# Patient Record
Sex: Female | Born: 1955 | Race: White | Hispanic: No | Smoking: Never smoker
Health system: Southern US, Community
[De-identification: ages and names within clinical notes are randomized; demographics above are authoritative.]

## PROBLEM LIST (undated history)

## (undated) DIAGNOSIS — M25569 Pain in unspecified knee: Secondary | ICD-10-CM

## (undated) DIAGNOSIS — M8580 Other specified disorders of bone density and structure, unspecified site: Secondary | ICD-10-CM

## (undated) DIAGNOSIS — G8929 Other chronic pain: Secondary | ICD-10-CM

## (undated) DIAGNOSIS — K754 Autoimmune hepatitis: Secondary | ICD-10-CM

## (undated) DIAGNOSIS — F411 Generalized anxiety disorder: Secondary | ICD-10-CM

## (undated) DIAGNOSIS — T7840XA Allergy, unspecified, initial encounter: Secondary | ICD-10-CM

## (undated) DIAGNOSIS — K219 Gastro-esophageal reflux disease without esophagitis: Secondary | ICD-10-CM

## (undated) DIAGNOSIS — G4700 Insomnia, unspecified: Secondary | ICD-10-CM

## (undated) DIAGNOSIS — H269 Unspecified cataract: Secondary | ICD-10-CM

## (undated) DIAGNOSIS — I10 Essential (primary) hypertension: Secondary | ICD-10-CM

## (undated) DIAGNOSIS — E119 Type 2 diabetes mellitus without complications: Secondary | ICD-10-CM

## (undated) DIAGNOSIS — E1165 Type 2 diabetes mellitus with hyperglycemia: Secondary | ICD-10-CM

## (undated) DIAGNOSIS — M545 Low back pain, unspecified: Secondary | ICD-10-CM

## (undated) DIAGNOSIS — E785 Hyperlipidemia, unspecified: Principal | ICD-10-CM

## (undated) DIAGNOSIS — R053 Chronic cough: Secondary | ICD-10-CM

## (undated) DIAGNOSIS — K589 Irritable bowel syndrome without diarrhea: Secondary | ICD-10-CM

## (undated) HISTORY — DX: Pain in unspecified knee: M25.569

## (undated) HISTORY — DX: Type 2 diabetes mellitus with hyperglycemia: E11.65

## (undated) HISTORY — DX: Allergy, unspecified, initial encounter: T78.40XA

## (undated) HISTORY — DX: Autoimmune hepatitis: K75.4

## (undated) HISTORY — DX: Low back pain: M54.5

## (undated) HISTORY — DX: Other chronic pain: G89.29

## (undated) HISTORY — DX: Chronic cough: R05.3

## (undated) HISTORY — DX: Other specified disorders of bone density and structure, unspecified site: M85.80

## (undated) HISTORY — DX: Gastro-esophageal reflux disease without esophagitis: K21.9

## (undated) HISTORY — PX: COLONOSCOPY: SHX174

## (undated) HISTORY — PX: BREAST SURGERY: SHX581

## (undated) HISTORY — DX: Type 2 diabetes mellitus without complications: E11.9

## (undated) HISTORY — PX: CHOLECYSTECTOMY: SHX55

## (undated) HISTORY — DX: Irritable bowel syndrome without diarrhea: K58.9

## (undated) HISTORY — PX: FOOT SURGERY: SHX648

## (undated) HISTORY — DX: Unspecified cataract: H26.9

## (undated) HISTORY — DX: Low back pain, unspecified: M54.50

## (undated) HISTORY — DX: Insomnia, unspecified: G47.00

## (undated) HISTORY — DX: Essential (primary) hypertension: I10

## (undated) HISTORY — PX: MENISCUS REPAIR: SHX5179

## (undated) HISTORY — PX: TONSILLECTOMY: SUR1361

## (undated) HISTORY — DX: Hyperlipidemia, unspecified: E78.5

## (undated) HISTORY — DX: Generalized anxiety disorder: F41.1

## (undated) HISTORY — PX: BREAST EXCISIONAL BIOPSY: SUR124

---

## 2005-01-14 ENCOUNTER — Other Ambulatory Visit: Admission: RE | Admit: 2005-01-14 | Discharge: 2005-01-14 | Payer: Self-pay | Admitting: Obstetrics and Gynecology

## 2007-08-14 ENCOUNTER — Ambulatory Visit: Payer: Self-pay | Admitting: Licensed Clinical Social Worker

## 2007-08-23 ENCOUNTER — Ambulatory Visit: Payer: Self-pay | Admitting: Licensed Clinical Social Worker

## 2007-08-30 ENCOUNTER — Encounter: Payer: Self-pay | Admitting: Family Medicine

## 2007-08-30 ENCOUNTER — Ambulatory Visit: Payer: Self-pay | Admitting: Licensed Clinical Social Worker

## 2007-08-31 ENCOUNTER — Encounter: Payer: Self-pay | Admitting: Family Medicine

## 2007-09-20 ENCOUNTER — Ambulatory Visit: Payer: Self-pay | Admitting: Licensed Clinical Social Worker

## 2007-10-18 ENCOUNTER — Ambulatory Visit: Payer: Self-pay | Admitting: Family Medicine

## 2007-10-18 DIAGNOSIS — I10 Essential (primary) hypertension: Secondary | ICD-10-CM

## 2007-10-18 DIAGNOSIS — K219 Gastro-esophageal reflux disease without esophagitis: Secondary | ICD-10-CM | POA: Insufficient documentation

## 2007-10-18 DIAGNOSIS — R519 Headache, unspecified: Secondary | ICD-10-CM

## 2007-10-18 DIAGNOSIS — F411 Generalized anxiety disorder: Secondary | ICD-10-CM

## 2007-10-18 DIAGNOSIS — K589 Irritable bowel syndrome without diarrhea: Secondary | ICD-10-CM

## 2007-10-18 DIAGNOSIS — E1159 Type 2 diabetes mellitus with other circulatory complications: Secondary | ICD-10-CM

## 2007-10-18 DIAGNOSIS — R51 Headache: Secondary | ICD-10-CM | POA: Insufficient documentation

## 2007-10-18 DIAGNOSIS — J309 Allergic rhinitis, unspecified: Secondary | ICD-10-CM

## 2007-10-18 HISTORY — DX: Allergic rhinitis, unspecified: J30.9

## 2007-10-18 HISTORY — DX: Gastro-esophageal reflux disease without esophagitis: K21.9

## 2007-10-18 HISTORY — DX: Headache, unspecified: R51.9

## 2007-10-18 HISTORY — DX: Generalized anxiety disorder: F41.1

## 2007-10-18 HISTORY — DX: Essential (primary) hypertension: I10

## 2007-10-18 HISTORY — DX: Irritable bowel syndrome, unspecified: K58.9

## 2007-10-25 ENCOUNTER — Encounter: Payer: Self-pay | Admitting: Family Medicine

## 2008-06-04 ENCOUNTER — Telehealth: Payer: Self-pay | Admitting: Family Medicine

## 2008-06-24 ENCOUNTER — Telehealth: Payer: Self-pay | Admitting: Family Medicine

## 2014-03-12 ENCOUNTER — Encounter: Payer: Self-pay | Admitting: Family Medicine

## 2014-03-12 ENCOUNTER — Telehealth: Payer: Self-pay | Admitting: Family Medicine

## 2014-03-12 ENCOUNTER — Ambulatory Visit (INDEPENDENT_AMBULATORY_CARE_PROVIDER_SITE_OTHER): Payer: PRIVATE HEALTH INSURANCE | Admitting: Family Medicine

## 2014-03-12 VITALS — BP 102/80 | HR 83 | Temp 97.8°F | Ht 66.0 in | Wt 209.5 lb

## 2014-03-12 DIAGNOSIS — E785 Hyperlipidemia, unspecified: Secondary | ICD-10-CM

## 2014-03-12 DIAGNOSIS — K219 Gastro-esophageal reflux disease without esophagitis: Secondary | ICD-10-CM

## 2014-03-12 DIAGNOSIS — M545 Low back pain: Secondary | ICD-10-CM

## 2014-03-12 DIAGNOSIS — I10 Essential (primary) hypertension: Secondary | ICD-10-CM

## 2014-03-12 DIAGNOSIS — G4700 Insomnia, unspecified: Secondary | ICD-10-CM

## 2014-03-12 DIAGNOSIS — Z23 Encounter for immunization: Secondary | ICD-10-CM

## 2014-03-12 HISTORY — DX: Insomnia, unspecified: G47.00

## 2014-03-12 HISTORY — DX: Hyperlipidemia, unspecified: E78.5

## 2014-03-12 NOTE — Telephone Encounter (Signed)
emmi emailed °

## 2014-03-12 NOTE — Addendum Note (Signed)
Addended by: Agnes Lawrence on: 03/12/2014 12:49 PM   Modules accepted: Orders

## 2014-03-12 NOTE — Patient Instructions (Addendum)
BEFORE YOU LEAVE: -Tdap -low back exercises -follow up morning appointment in 1 month - come fasting  -let us know which cholesterol medication dose you are taking  Schedule mammogram - call number provided  For you back: Exercises provided 4 days per week Follow up in 1 month   We advise that you please obtain and provide [place] with the following so that we can provide you with the best care possible:  NOTE: please provide ONLY the requested documents and reserve a copy for yourself  -copies of all vaccines -reports from any heart studies, EKGs, xrays, MRIs, CT scans, ultrasounds or biopsies -any lab tests done in the last 2 years -reports from any gastrointestinal studies such as colonoscopy or endoscopy -reports from any pap smears, mammograms or bone density tests -physical exam doctor notes from your last physical -the most recent office visit note from any specialist you have seen  Thank you.   FOR IMPROVED SLEEP AND TO RESET YOUR SLEEP SCHEDULE: []  exercise 30 minutes daily  []  go to bed and wake up at the same time  []  keep bedroom cool, dark and quiet  []  reserve bed for sleep - do not read, watch TV, etc in bed  []  If you toss and turn more then 15-20 minutes get out of bed and list thoughts/do quite activity then go back to bed; repeat as needed; do not worry about when you eventually fall asleep - still get up at the same time and turn on lights and take shower  [] get counseling  []  some people find that a half dose of benadryl, melatonin, tylenol pm or unisom on a few nights per week is helpful initially for a few weeks  [] seek help and treat any depression or anxiety  [] prescription strength sleep medications should only be used in severe cases of insomnia if other measures fail and should be used sparingly

## 2014-03-12 NOTE — Progress Notes (Signed)
HPI:  Karen Hall is here to establish care. Used to live in [place] but felt like she was doing too many tests and lives closer now and has insurance now.  Has the following chronic problems that require follow up and concerns today:  GERD: -on chronic low dose PPI or has issue -reports has egd and colonoscopy and all was ok -hx asymptomatic gallstones  HTN/HLD: -losartan-hctz 50-12.5, lovastatin (unsure of dose today and out of this- wonders if needs it) -stable -denies: CP, SOB, DOE, swelling  Insomnia/GAD: -uses ambien several times per month -trouble falling asleep -takes celexa  -no regular counseling, no regular exercise -no sleep walking, sleep eating, apnea, no SI or hx hospitalization  Hx chronic intermittent low back pain: -several flares per year -current episode started a few days ago with standing -constant R and L low back, moderate pain -denies: weakness, numbness, bowel or bladder incontinence  HM: -mammo out of date -declined flu -ok with getting tdap -going ot check on insurance for shingles vaccine  ROS negative for unless reported above: fevers, unintentional weight loss, hearing or vision loss, chest pain, palpitations, struggling to breath, hemoptysis, melena, hematochezia, hematuria, falls, loc, si, thoughts of self harm  Past Medical History  Diagnosis Date  . HYPERTENSION 10/18/2007    Qualifier: Diagnosis of  By: Sherlynn Stalls, CMA, [place]    . Irritable bowel syndrome 10/18/2007    Qualifier: Diagnosis of  By: Sarajane Jews MD, Ishmael Holter   . GERD 10/18/2007    Qualifier: Diagnosis of  By: Sherlynn Stalls, CMA, [place]    . Insomnia 03/12/2014  . Hyperlipemia 03/12/2014  . ANXIETY 10/18/2007    Qualifier: Diagnosis of  By: Sarajane Jews MD, Ishmael Holter   . Chronic low back pain     Past Surgical History  Procedure Laterality Date  . Meniscus repair Right   . Breast surgery      fibroid  . Tonsillectomy      Family History  Problem Relation Age of Onset  . Cancer  Mother     melanoma  . Cancer Father     liver    History   Social History  . Marital Status: Widowed    Spouse Name: N/A    Number of Children: N/A  . Years of Education: N/A   Social History Main Topics  . Smoking status: Never Smoker   . Smokeless tobacco: None  . Alcohol Use: None  . Drug Use: None  . Sexual Activity: None   Other Topics Concern  . None   Social History Narrative   Work or School: Press photographer - [employer]       Home Situation: lives alone      Spiritual Beliefs: none      Lifestyle: no regular exercise; poor diet          Current outpatient prescriptions: citalopram (CELEXA) 20 MG tablet, Take 20 mg by mouth daily., Disp: , Rfl: ;  co-enzyme Q-10 30 MG capsule, Take 30 mg by mouth daily., Disp: , Rfl: ;  diphenhydramine-acetaminophen (TYLENOL PM) 25-500 MG TABS, Take 1 tablet by mouth at bedtime as needed., Disp: , Rfl: ;  Homeopathic Products (ZICAM ALLERGY RELIEF) GEL, Place into the nose., Disp: , Rfl:  losartan-hydrochlorothiazide (HYZAAR) 50-12.5 MG per tablet, Take 1 tablet by mouth daily., Disp: , Rfl: ;  LOVASTATIN PO, Take by mouth daily., Disp: , Rfl: ;  zolpidem (AMBIEN CR) 6.25 MG CR tablet, Take 6.25 mg by mouth at bedtime as  needed for sleep., Disp: , Rfl:   EXAM:  Filed Vitals:   03/12/14 1119  BP: 102/80  Pulse: 83  Temp: 97.8 F (36.6 C)    Body mass index is 33.83 kg/(m^2).  GENERAL: vitals reviewed and listed above, alert, oriented, appears well hydrated and in no acute distress  HEENT: atraumatic, conjunttiva clear, no obvious abnormalities on inspection of external nose and ears  NECK: no obvious masses on inspection  LUNGS: clear to auscultation bilaterally, no wheezes, rales or rhonchi, good air movement  CV: HRRR, no peripheral edema  MS: moves all extremities without noticeable abnormality Normal Gait Normal inspection of back, no obvious scoliosis or leg length descrepancy No bony TTP Soft tissue TTP  at: bilat R > L lumbar paraspinal muscles, R PSIS, R hip higer -/+ tests: neg trendelenburg,+facet loading R> L, -SLRT, -CLRT, -FABER, -FADIR Normal muscle strength, sensation to light touch and DTRs in LEs bilaterally  PSYCH: pleasant and cooperative, no obvious depression or anxiety  ASSESSMENT AND PLAN:  Discussed the following assessment and plan:  Hyperlipemia  Insomnia  Essential hypertension  Gastroesophageal reflux disease, esophagitis presence not specified  Low back pain without sciatica, unspecified back pain laterality  We reviewed the PMH, PSH, FH, SH, Meds and Allergies. -We provided refills for any medications we will prescribe as needed. -We addressed current concerns per orders and patient instructions. -We have asked for records for pertinent exams, studies, vaccines and notes from previous providers. -We have advised patient to follow up per instructions below.   -Patient advised to return or notify a doctor immediately if symptoms worsen or persist or new concerns arise.  Patient Instructions  BEFORE YOU LEAVE: -Tdap -low back exercises -follow up morning appointment in 1 month - come fasting  -let us know which cholesterol medication dose you are taking  Schedule mammogram - call number provided  For you back: Exercises provided 4 days per week Follow up in 1 month   We advise that you please obtain and provide [place] with the following so that we can provide you with the best care possible:  NOTE: please provide ONLY the requested documents and reserve a copy for yourself  -copies of all vaccines -reports from any heart studies, EKGs, xrays, MRIs, CT scans, ultrasounds or biopsies -any lab tests done in the last 2 years -reports from any gastrointestinal studies such as colonoscopy or endoscopy -reports from any pap smears, mammograms or bone density tests -physical exam doctor notes from your last physical -the most recent office visit note from  any specialist you have seen  Thank you.   FOR IMPROVED SLEEP AND TO RESET YOUR SLEEP SCHEDULE: []  exercise 30 minutes daily  []  go to bed and wake up at the same time  []  keep bedroom cool, dark and quiet  []  reserve bed for sleep - do not read, watch TV, etc in bed  []  If you toss and turn more then 15-20 minutes get out of bed and list thoughts/do quite activity then go back to bed; repeat as needed; do not worry about when you eventually fall asleep - still get up at the same time and turn on lights and take shower  [] get counseling  []  some people find that a half dose of benadryl, melatonin, tylenol pm or unisom on a few nights per week is helpful initially for a few weeks  [] seek help and treat any depression or anxiety  [] prescription strength sleep medications should only be used in  severe cases of insomnia if other measures fail and should be used sparingly      Azariyah Luhrs, Jarrett Soho R.

## 2014-03-12 NOTE — Progress Notes (Signed)
Pre visit review using our clinic review tool, if applicable. No additional management support is needed unless otherwise documented below in the visit note. 

## 2014-03-13 ENCOUNTER — Telehealth: Payer: Self-pay | Admitting: Family Medicine

## 2014-03-13 NOTE — Telephone Encounter (Signed)
Pt is on atorvastatin 10 mg #30 and needs refill call into [facility]. Pt was seen yesterday

## 2014-03-14 MED ORDER — ATORVASTATIN CALCIUM 10 MG PO TABS
10.0000 mg | ORAL_TABLET | Freq: Every day | ORAL | Status: DC
Start: 2014-03-14 — End: 2015-03-24

## 2014-03-14 NOTE — Telephone Encounter (Signed)
Noted  

## 2014-03-14 NOTE — Telephone Encounter (Signed)
Sent!

## 2014-04-01 ENCOUNTER — Other Ambulatory Visit: Payer: Self-pay | Admitting: Family Medicine

## 2014-04-01 MED ORDER — ZOLPIDEM TARTRATE ER 6.25 MG PO TBCR: EXTENDED_RELEASE_TABLET | ORAL | Status: DC

## 2014-04-01 NOTE — Addendum Note (Signed)
Addended by: Westley Hummer B on: 04/01/2014 04:11 PM   Modules accepted: Orders

## 2014-04-08 ENCOUNTER — Encounter: Payer: Self-pay | Admitting: Family Medicine

## 2014-04-10 ENCOUNTER — Ambulatory Visit: Payer: PRIVATE HEALTH INSURANCE | Admitting: Family Medicine

## 2014-04-19 ENCOUNTER — Ambulatory Visit: Payer: PRIVATE HEALTH INSURANCE | Admitting: Family Medicine

## 2014-04-22 ENCOUNTER — Encounter: Payer: Self-pay | Admitting: Family Medicine

## 2014-04-22 ENCOUNTER — Ambulatory Visit (INDEPENDENT_AMBULATORY_CARE_PROVIDER_SITE_OTHER): Payer: PRIVATE HEALTH INSURANCE | Admitting: Family Medicine

## 2014-04-22 ENCOUNTER — Other Ambulatory Visit: Payer: Self-pay | Admitting: *Deleted

## 2014-04-22 VITALS — BP 100/76 | HR 80 | Temp 98.1°F | Ht 66.0 in | Wt 208.1 lb

## 2014-04-22 DIAGNOSIS — K219 Gastro-esophageal reflux disease without esophagitis: Secondary | ICD-10-CM

## 2014-04-22 DIAGNOSIS — G4700 Insomnia, unspecified: Secondary | ICD-10-CM

## 2014-04-22 DIAGNOSIS — E785 Hyperlipidemia, unspecified: Secondary | ICD-10-CM

## 2014-04-22 DIAGNOSIS — R899 Unspecified abnormal finding in specimens from other organs, systems and tissues: Secondary | ICD-10-CM

## 2014-04-22 DIAGNOSIS — Z23 Encounter for immunization: Secondary | ICD-10-CM

## 2014-04-22 DIAGNOSIS — M549 Dorsalgia, unspecified: Secondary | ICD-10-CM

## 2014-04-22 DIAGNOSIS — I10 Essential (primary) hypertension: Secondary | ICD-10-CM

## 2014-04-22 LAB — BASIC METABOLIC PANEL
BUN: 20 mg/dL (ref 6–23)
CO2: 25 meq/L (ref 19–32)
Calcium: 9.7 mg/dL (ref 8.4–10.5)
Chloride: 103 mEq/L (ref 96–112)
Creatinine, Ser: 1.34 mg/dL — ABNORMAL HIGH (ref 0.40–1.20)
GFR: 42.78 mL/min — AB (ref >60.00)
Glucose, Bld: 126 mg/dL — ABNORMAL HIGH (ref 70–99)
POTASSIUM: 3.8 meq/L (ref 3.5–5.1)
Sodium: 138 mEq/L (ref 135–145)

## 2014-04-22 LAB — LIPID PANEL
Cholesterol: 200 mg/dL (ref 0–200)
HDL: 41.4 mg/dL (ref >39.00)
NONHDL: 158.6
TRIGLYCERIDES: 342 mg/dL — AB (ref 0.0–149.0)
Total CHOL/HDL Ratio: 5
VLDL: 68.4 mg/dL — ABNORMAL HIGH (ref 0.0–40.0)

## 2014-04-22 LAB — LDL CHOLESTEROL, DIRECT: Direct LDL: 95 mg/dL

## 2014-04-22 MED ORDER — LOSARTAN POTASSIUM-HCTZ 50-12.5 MG PO TABS: 0.5000 | ORAL_TABLET | Freq: Every day | ORAL | Status: DC

## 2014-04-22 NOTE — Progress Notes (Signed)
HPI:  Hx chronic intermittent low back pain: -resolved - doing much better! -denies: weakness, numbness, bowel or bladder incontinence  GERD: -on chronic low dose PPI or has issue -reports has egd and colonoscopy and all was ok -hx asymptomatic gallstones  HTN/HLD: -losartan-hctz 50-12.5, statin -stable - reports always running low and wants to decrease BP med -denies: CP, SOB, DOE, swelling  Insomnia/GAD: -uses ambien several times per month -trouble falling asleep -takes celexa  -no regular counseling, no regular exercise -no sleep walking, sleep eating, apnea, no SI or hx hospitalization     ROS: See pertinent positives and negatives per HPI.  Past Medical History  Diagnosis Date  . HYPERTENSION 10/18/2007    Qualifier: Diagnosis of  By: Sherlynn Stalls, CMA, [place]    . Irritable bowel syndrome 10/18/2007    Qualifier: Diagnosis of  By: Sarajane Jews MD, Ishmael Holter   . GERD 10/18/2007    Qualifier: Diagnosis of  By: Sherlynn Stalls, CMA, [place]    . Insomnia 03/12/2014  . Hyperlipemia 03/12/2014  . ANXIETY 10/18/2007    Qualifier: Diagnosis of  By: Sarajane Jews MD, Ishmael Holter   . Chronic low back pain     Past Surgical History  Procedure Laterality Date  . Meniscus repair Right   . Breast surgery      fibroid  . Tonsillectomy      Family History  Problem Relation Age of Onset  . Cancer Mother     melanoma  . Cancer Father     liver    History   Social History  . Marital Status: Widowed    Spouse Name: N/A    Number of Children: N/A  . Years of Education: N/A   Social History Main Topics  . Smoking status: Never Smoker   . Smokeless tobacco: None  . Alcohol Use: None  . Drug Use: None  . Sexual Activity: None   Other Topics Concern  . None   Social History Narrative   Work or School: Press photographer - [employer]       Home Situation: lives alone      Spiritual Beliefs: none      Lifestyle: no regular exercise; poor diet           Current outpatient prescriptions:  .   amoxicillin (AMOXIL) 500 MG capsule, 4 (four) times daily - after meals and at bedtime. , Disp: , Rfl: 0 .  atorvastatin (LIPITOR) 10 MG tablet, Take 1 tablet (10 mg total) by mouth daily., Disp: 30 tablet, Rfl: 11 .  citalopram (CELEXA) 20 MG tablet, Take 20 mg by mouth daily., Disp: , Rfl:  .  co-enzyme Q-10 30 MG capsule, Take 30 mg by mouth daily., Disp: , Rfl:  .  diphenhydramine-acetaminophen (TYLENOL PM) 25-500 MG TABS, Take 1 tablet by mouth at bedtime as needed., Disp: , Rfl:  .  Homeopathic Products (ZICAM ALLERGY RELIEF) GEL, Place into the nose., Disp: , Rfl:  .  losartan-hydrochlorothiazide (HYZAAR) 50-12.5 MG per tablet, Take 0.5 tablets by mouth daily., Disp: 45 tablet, Rfl: 3 .  zolpidem (AMBIEN CR) 6.25 MG CR tablet, 1 tablet at bedtime as needed and only for severe sleep disorder. Use as little as possible. Needs office visit prior to further refills., Disp: 20 tablet, Rfl: 0  EXAM:  Filed Vitals:   04/22/14 0814  BP: 100/76  Pulse: 80  Temp: 98.1 F (36.7 C)    Body mass index is 33.6 kg/(m^2).  GENERAL: vitals reviewed and listed above,  alert, oriented, appears well hydrated and in no acute distress  HEENT: atraumatic, conjunttiva clear, no obvious abnormalities on inspection of external nose and ears  NECK: no obvious masses on inspection  LUNGS: clear to auscultation bilaterally, no wheezes, rales or rhonchi, good air movement  CV: HRRR, no peripheral edema  MS: moves all extremities without noticeable abnormality  PSYCH: pleasant and cooperative, no obvious depression or anxiety  ASSESSMENT AND PLAN:  Discussed the following assessment and plan:  Essential hypertension - Plan: Basic metabolic panel  Gastroesophageal reflux disease, esophagitis presence not specified  Hyperlipemia - Plan: Lipid Panel  Insomnia  Bilateral back pain, unspecified location  -FASTING LABS -advised mammo, flu and shingles vaccines -Patient advised to return or notify  a doctor immediately if symptoms worsen or persist or new concerns arise.  Patient Instructions  BEFORE YOU LEAVE: -shingles vaccine -labs -nurse visit in 2-4 weeks to recheck BP on lower dose medication  Schedule mammogram  Decrease BP med to 1/2 tablet per day and monitor  Continue back exercises 3 days per week when feeling fine  -We have ordered labs or studies at this visit. It can take up to 1-2 weeks for results and processing. We will contact you with instructions IF your results are abnormal. Normal results will be released to your [facility]. If you have not heard from [place] or can not find your results in [facility] in 2 weeks please contact our office.  -PLEASE SIGN UP FOR MYCHART TODAY   We recommend the following healthy lifestyle measures: - eat a healthy diet consisting of lots of vegetables, fruits, beans, nuts, seeds, healthy meats such as white chicken and fish and whole grains.  - avoid fried foods, fast food, processed foods, sodas, red meet and other fattening foods.  - get a least 150 minutes of aerobic exercise per week.       Colin Benton R.

## 2014-04-22 NOTE — Patient Instructions (Addendum)
BEFORE YOU LEAVE: -shingles vaccine -labs -nurse visit in 2-4 weeks to recheck BP on lower dose medication  Schedule mammogram  Decrease BP med to 1/2 tablet per day and monitor  Continue back exercises 3 days per week when feeling fine  -We have ordered labs or studies at this visit. It can take up to 1-2 weeks for results and processing. We will contact you with instructions IF your results are abnormal. Normal results will be released to your [facility]. If you have not heard from [place] or can not find your results in [facility] in 2 weeks please contact our office.  -PLEASE SIGN UP FOR MYCHART TODAY   We recommend the following healthy lifestyle measures: - eat a healthy diet consisting of lots of vegetables, fruits, beans, nuts, seeds, healthy meats such as white chicken and fish and whole grains.  - avoid fried foods, fast food, processed foods, sodas, red meet and other fattening foods.  - get a least 150 minutes of aerobic exercise per week.

## 2014-04-22 NOTE — Addendum Note (Signed)
Addended by: Agnes Lawrence on: 04/22/2014 08:48 AM   Modules accepted: Orders

## 2014-04-22 NOTE — Progress Notes (Signed)
Pre visit review using our clinic review tool, if applicable. No additional management support is needed unless otherwise documented below in the visit note. 

## 2014-04-25 ENCOUNTER — Telehealth: Payer: Self-pay | Admitting: Family Medicine

## 2014-04-25 NOTE — Telephone Encounter (Signed)
emmi emailed °

## 2014-04-29 ENCOUNTER — Other Ambulatory Visit: Payer: PRIVATE HEALTH INSURANCE

## 2014-05-02 ENCOUNTER — Other Ambulatory Visit (INDEPENDENT_AMBULATORY_CARE_PROVIDER_SITE_OTHER): Payer: PRIVATE HEALTH INSURANCE

## 2014-05-02 DIAGNOSIS — R899 Unspecified abnormal finding in specimens from other organs, systems and tissues: Secondary | ICD-10-CM

## 2014-05-02 LAB — BASIC METABOLIC PANEL
BUN: 19 mg/dL (ref 6–23)
CALCIUM: 10.2 mg/dL (ref 8.4–10.5)
CO2: 27 meq/L (ref 19–32)
CREATININE: 1.17 mg/dL (ref 0.40–1.20)
Chloride: 102 mEq/L (ref 96–112)
GFR: 50.03 mL/min — AB (ref >60.00)
GLUCOSE: 100 mg/dL — AB (ref 70–99)
Potassium: 4.2 mEq/L (ref 3.5–5.1)
SODIUM: 135 meq/L (ref 135–145)

## 2014-05-02 LAB — HEMOGLOBIN A1C: Hgb A1c MFr Bld: 7.1 % — ABNORMAL HIGH (ref 4.6–6.5)

## 2014-05-14 ENCOUNTER — Other Ambulatory Visit: Payer: Self-pay

## 2014-05-14 DIAGNOSIS — Z1231 Encounter for screening mammogram for malignant neoplasm of breast: Secondary | ICD-10-CM

## 2014-05-20 ENCOUNTER — Ambulatory Visit
Admission: RE | Admit: 2014-05-20 | Discharge: 2014-05-20 | Disposition: A | Discharge status: Home / Self Care | Payer: PRIVATE HEALTH INSURANCE | Source: Ambulatory Visit

## 2014-05-20 DIAGNOSIS — Z1231 Encounter for screening mammogram for malignant neoplasm of breast: Secondary | ICD-10-CM

## 2014-06-05 ENCOUNTER — Telehealth: Payer: Self-pay | Admitting: Family Medicine

## 2014-06-05 MED ORDER — CITALOPRAM HYDROBROMIDE 20 MG PO TABS: 20.0000 mg | ORAL_TABLET | Freq: Every day | ORAL | Status: DC

## 2014-06-05 NOTE — Telephone Encounter (Signed)
Pt request refill of the following: citalopram (CELEXA) 20 MG tablet   Phamacy: [place]

## 2014-07-04 ENCOUNTER — Ambulatory Visit (INDEPENDENT_AMBULATORY_CARE_PROVIDER_SITE_OTHER): Payer: PRIVATE HEALTH INSURANCE | Admitting: Family Medicine

## 2014-07-04 ENCOUNTER — Encounter: Payer: Self-pay | Admitting: Family Medicine

## 2014-07-04 VITALS — BP 120/86 | HR 81 | Temp 98.0°F | Ht 66.0 in | Wt 201.4 lb

## 2014-07-04 DIAGNOSIS — Z6832 Body mass index (BMI) 32.0-32.9, adult: Secondary | ICD-10-CM

## 2014-07-04 DIAGNOSIS — I10 Essential (primary) hypertension: Secondary | ICD-10-CM

## 2014-07-04 DIAGNOSIS — J309 Allergic rhinitis, unspecified: Secondary | ICD-10-CM

## 2014-07-04 DIAGNOSIS — E119 Type 2 diabetes mellitus without complications: Secondary | ICD-10-CM

## 2014-07-04 DIAGNOSIS — E785 Hyperlipidemia, unspecified: Secondary | ICD-10-CM

## 2014-07-04 DIAGNOSIS — G4700 Insomnia, unspecified: Secondary | ICD-10-CM

## 2014-07-04 MED ORDER — ZOLPIDEM TARTRATE ER 6.25 MG PO TBCR: EXTENDED_RELEASE_TABLET | ORAL | Status: DC

## 2014-07-04 NOTE — Progress Notes (Signed)
Pre visit review using our clinic review tool, if applicable. No additional management support is needed unless otherwise documented below in the visit note. 

## 2014-07-04 NOTE — Patient Instructions (Addendum)
BEFORE YOU LEAVE: -schedule follow up in 3 months   FOR YOUR DIABETES:  []  Eat a healthy low carb diet (avoid sweets, sweet drinks, breads, potatoes, rice, etc.) and ensure 3 small meals daily.  []  Get AT LEAST 150 minutes of cardiovascular exercise per week - 30 minutes per day is best of sustained sweaty exercise.  []  Take all of your medications every day as directed by your doctor. Call your doctor immediately if you have any questions about your medications or are running low.  []  If any low blood sugars < 70, eat a snack and call your doctor immediately.  []  See an eye doctor every year and fax your diabetic eye exam to our office.  Fax: [phone]  []  Take good care of your feet and keep them soft and callus free. Check your feet daily and wear comfortable shoes. See your doctor immediately if you have any cuts, calluses or wounds on your feet.

## 2014-07-04 NOTE — Progress Notes (Addendum)
HPI:  Karen Hall is a 59 new patient to me here for an acute visit to discuss:  DM: -new on repeated labs recently - she was to follow up promptly but she wanted to work on diet and exercise for 2 months before follow up -reports: is doing weight watchers, she is also planning to use essential oils - she is doing weight watchers and has lost 8 lbs - reports hates vegetables and water but is working on portion sizes -denies: polyuria, polydipsia  HTN: -meds: losartan-hctz -wanted to decrease BP meds last visit - she was to have nurse visit 2 weeks later but did not do this -stable - BP looks good today -reports feels better on lower dose of BP medication  HLD/Obesity: -on statin -stable - she would like to eventual get off of the statin if possible  ROS: See pertinent positives and negatives per HPI.  Past Medical History  Diagnosis Date  . HYPERTENSION 10/18/2007    Qualifier: Diagnosis of  By: Sherlynn Stalls, CMA, [place]    . Irritable bowel syndrome 10/18/2007    Qualifier: Diagnosis of  By: Sarajane Jews MD, Ishmael Holter   . GERD 10/18/2007    Qualifier: Diagnosis of  By: Sherlynn Stalls, CMA, [place]    . Insomnia 03/12/2014  . Hyperlipemia 03/12/2014  . ANXIETY 10/18/2007    Qualifier: Diagnosis of  By: Sarajane Jews MD, Ishmael Holter   . Chronic low back pain   . Recurrent knee pain     R knee hx meniscal tear 2002 with repair    Past Surgical History  Procedure Laterality Date  . Meniscus repair Right   . Breast surgery      fibroid  . Tonsillectomy      Family History  Problem Relation Age of Onset  . Cancer Mother     melanoma  . Cancer Father     liver    History   Social History  . Marital Status: Widowed    Spouse Name: N/A  . Number of Children: N/A  . Years of Education: N/A   Social History Main Topics  . Smoking status: Never Smoker   . Smokeless tobacco: Not on file  . Alcohol Use: Not on file  . Drug Use: Not on file  . Sexual Activity: Not on file   Other Topics Concern  .  None   Social History Narrative   Work or School: Press photographer - [employer]       Home Situation: lives alone      Spiritual Beliefs: none      Lifestyle: no regular exercise; poor diet           Current outpatient prescriptions:  .  atorvastatin (LIPITOR) 10 MG tablet, Take 1 tablet (10 mg total) by mouth daily., Disp: 30 tablet, Rfl: 11 .  citalopram (CELEXA) 20 MG tablet, Take 1 tablet (20 mg total) by mouth daily., Disp: 90 tablet, Rfl: 1 .  co-enzyme Q-10 30 MG capsule, Take 30 mg by mouth daily., Disp: , Rfl:  .  diphenhydramine-acetaminophen (TYLENOL PM) 25-500 MG TABS, Take 1 tablet by mouth at bedtime as needed., Disp: , Rfl:  .  Homeopathic Products (ZICAM ALLERGY RELIEF) GEL, Place into the nose., Disp: , Rfl:  .  losartan-hydrochlorothiazide (HYZAAR) 50-12.5 MG per tablet, Take 0.5 tablets by mouth daily., Disp: 45 tablet, Rfl: 3 .  zolpidem (AMBIEN CR) 6.25 MG CR tablet, 1 tablet at bedtime as needed and only for severe sleep disorder. Use  as little as possible. Needs office visit prior to further refills., Disp: 20 tablet, Rfl: 0  EXAM:  Filed Vitals:   07/04/14 1046  BP: 120/86  Pulse: 81  Temp: 98 F (36.7 C)    Body mass index is 32.52 kg/(m^2).  GENERAL: vitals reviewed and listed above, alert, oriented, appears well hydrated and in no acute distress  HEENT: atraumatic, conjunttiva clear, no obvious abnormalities on inspection of external nose and ears  NECK: no obvious masses on inspection  LUNGS: clear to auscultation bilaterally, no wheezes, rales or rhonchi, good air movement  CV: HRRR, no peripheral edema  MS: moves all extremities without noticeable abnormality  PSYCH: pleasant and cooperative, no obvious depression or anxiety  ASSESSMENT AND PLAN:  Discussed the following assessment and plan:  Essential hypertension  Type 2 diabetes mellitus without complication  Allergic rhinitis, unspecified allergic rhinitis  type  Hyperlipemia  BMI 32.0-32.9,adult  Insomnia  -long discussion about diabetes, implications, treatment - she is very against medications and wants to treat with diet, exercis and essential oils -she loves carbs and hates veggies and water and is not exercising, but she has been able to loose some weight with modifying calorie intake -she opted ofr lifestyle changes and follow up in 3 months with labs then -at the end of a long appointment requests ambien refill - advised her I do not recommend this for regular or long term use and do not rx as such, refills small amount to use sparingly -Patient advised to return or notify a doctor immediately if symptoms worsen or persist or new concerns arise.  Patient Instructions  BEFORE YOU LEAVE: -schedule follow up in 3 months   FOR YOUR DIABETES:  []  Eat a healthy low carb diet (avoid sweets, sweet drinks, breads, potatoes, rice, etc.) and ensure 3 small meals daily.  []  Get AT LEAST 150 minutes of cardiovascular exercise per week - 30 minutes per day is best of sustained sweaty exercise.  []  Take all of your medications every day as directed by your doctor. Call your doctor immediately if you have any questions about your medications or are running low.  []  If any low blood sugars < 70, eat a snack and call your doctor immediately.  []  See an eye doctor every year and fax your diabetic eye exam to our office.  Fax: [phone]  []  Take good care of your feet and keep them soft and callus free. Check your feet daily and wear comfortable shoes. See your doctor immediately if you have any cuts, calluses or wounds on your feet.      Colin Benton R.

## 2014-07-04 NOTE — Addendum Note (Signed)
Addended by: [name] on: 07/04/2014 11:22 AM   Modules accepted: Orders, Medications

## 2014-09-09 ENCOUNTER — Other Ambulatory Visit: Payer: Self-pay

## 2014-11-24 ENCOUNTER — Other Ambulatory Visit: Payer: Self-pay | Admitting: Family Medicine

## 2014-11-29 ENCOUNTER — Encounter: Payer: Self-pay | Admitting: Family Medicine

## 2014-11-29 ENCOUNTER — Ambulatory Visit (INDEPENDENT_AMBULATORY_CARE_PROVIDER_SITE_OTHER): Payer: PRIVATE HEALTH INSURANCE | Admitting: Family Medicine

## 2014-11-29 VITALS — BP 102/80 | HR 93 | Temp 98.2°F | Ht 66.0 in | Wt 204.6 lb

## 2014-11-29 DIAGNOSIS — E119 Type 2 diabetes mellitus without complications: Secondary | ICD-10-CM

## 2014-11-29 DIAGNOSIS — M25552 Pain in left hip: Secondary | ICD-10-CM | POA: Diagnosis not present

## 2014-11-29 DIAGNOSIS — Z23 Encounter for immunization: Secondary | ICD-10-CM

## 2014-11-29 DIAGNOSIS — E785 Hyperlipidemia, unspecified: Secondary | ICD-10-CM | POA: Diagnosis not present

## 2014-11-29 DIAGNOSIS — M25561 Pain in right knee: Secondary | ICD-10-CM

## 2014-11-29 DIAGNOSIS — I10 Essential (primary) hypertension: Secondary | ICD-10-CM

## 2014-11-29 LAB — BASIC METABOLIC PANEL
BUN: 29 mg/dL — ABNORMAL HIGH (ref 7–25)
CO2: 23 mmol/L (ref 20–31)
Calcium: 9 mg/dL (ref 8.6–10.4)
Chloride: 105 mmol/L (ref 98–110)
Creat: 1.15 mg/dL — ABNORMAL HIGH (ref 0.50–0.99)
Glucose, Bld: 77 mg/dL (ref 65–99)
POTASSIUM: 4 mmol/L (ref 3.5–5.3)
Sodium: 136 mmol/L (ref 135–146)

## 2014-11-29 NOTE — Progress Notes (Signed)
Pre visit review using our clinic review tool, if applicable. No additional management support is needed unless otherwise documented below in the visit note. 

## 2014-11-29 NOTE — Patient Instructions (Signed)
BEFORE YOU LEAVE: - IT band exercises -flu shot -labs -schedule follow up in 1 month  Stop BP medication and we will recheck at follow up  Exercises for hip and aleve once daily for 2 weeks  We placed a referral for you as discussed for the knee pain. It usually takes about 1-2 weeks to process and schedule this referral. If you have not heard from [place] regarding this appointment in 2 weeks please contact our office.

## 2014-11-29 NOTE — Progress Notes (Signed)
HPI:  DM w/CKD: -dx 05/02/14 -she refused appt to address and wanted to try diet and exercise and recheck, but did not follow up until now -reports: feels fine, working on diet, but not on exercise -denies:polyuria, polydipsia, vision changes  HTN: -meds: losartan-hctz 50-12.5 -wants to stop as with lifestyle changes and less stress BP has dropped and she is only taking a half tab and feels still BP too low -denies:  HLD/Obesity: -meds: atorvastatin 10mg  -denies: statin intolerance  L hip pain: -on and off for 6 months, worse with increased activity -L lat hip and it band -no weakness, numbness, fevers, malaise  Chronic R knee pain: -s/p meniscal repair in 2002 -persistent swelling and pain since, now has the finances to address and wants referral to ortho  Insomnia/GAD: -uses ambien several times per month -trouble falling asleep -takes celexa  -no regular counseling, no regular exercise -no sleep walking, sleep eating, apnea, no SI or hx hospitalization  GERD: -on chronic low dose PPI or has issue -reports has egd and colonoscopy and all was ok -hx asymptomatic gallstones  ROS: See pertinent positives and negatives per HPI.  Past Medical History  Diagnosis Date  . HYPERTENSION 10/18/2007    Qualifier: Diagnosis of  By: Sherlynn Stalls, CMA, [place]    . Irritable bowel syndrome 10/18/2007    Qualifier: Diagnosis of  By: Sarajane Jews MD, Ishmael Holter   . GERD 10/18/2007    Qualifier: Diagnosis of  By: Sherlynn Stalls, CMA, [place]    . Insomnia 03/12/2014  . Hyperlipemia 03/12/2014  . ANXIETY 10/18/2007    Qualifier: Diagnosis of  By: Sarajane Jews MD, Ishmael Holter   . Chronic low back pain   . Recurrent knee pain     R knee hx meniscal tear 2002 with repair    Past Surgical History  Procedure Laterality Date  . Meniscus repair Right   . Breast surgery      fibroid  . Tonsillectomy      Family History  Problem Relation Age of Onset  . Cancer Mother     melanoma  . Cancer Father     liver     Social History   Social History  . Marital Status: Widowed    Spouse Name: N/A  . Number of Children: N/A  . Years of Education: N/A   Social History Main Topics  . Smoking status: Never Smoker   . Smokeless tobacco: None  . Alcohol Use: None  . Drug Use: None  . Sexual Activity: Not Asked   Other Topics Concern  . None   Social History Narrative   Work or School: Press photographer - [employer]       Home Situation: lives alone      Spiritual Beliefs: none      Lifestyle: no regular exercise; poor diet           Current outpatient prescriptions:  .  atorvastatin (LIPITOR) 10 MG tablet, Take 1 tablet (10 mg total) by mouth daily., Disp: 30 tablet, Rfl: 11 .  citalopram (CELEXA) 20 MG tablet, TAKE 1 TABLET (20 MG TOTAL) BY MOUTH DAILY., Disp: 90 tablet, Rfl: 0 .  co-enzyme Q-10 30 MG capsule, Take 30 mg by mouth daily., Disp: , Rfl:  .  diphenhydramine-acetaminophen (TYLENOL PM) 25-500 MG TABS, Take 1 tablet by mouth at bedtime as needed., Disp: , Rfl:  .  Homeopathic Products (ZICAM ALLERGY RELIEF) GEL, Place into the nose., Disp: , Rfl:  .  losartan-hydrochlorothiazide (HYZAAR) 50-12.5 MG  per tablet, Take 0.5 tablets by mouth daily., Disp: 45 tablet, Rfl: 3 .  zolpidem (AMBIEN CR) 6.25 MG CR tablet, 1 tablet at bedtime as needed and only for severe sleep disorder. Use as little as possible. Needs office visit prior to further refills., Disp: 20 tablet, Rfl: 0  EXAM:  Filed Vitals:   11/29/14 1602  BP: 102/80  Pulse: 93  Temp: 98.2 F (36.8 C)    Body mass index is 33.04 kg/(m^2).  GENERAL: vitals reviewed and listed above, alert, oriented, appears well hydrated and in no acute distress  HEENT: atraumatic, conjunttiva clear, no obvious abnormalities on inspection of external nose and ears  NECK: no obvious masses on inspection  LUNGS: clear to auscultation bilaterally, no wheezes, rales or rhonchi, good air movement  CV: HRRR, no peripheral  edema  MS: moves all extremities without noticeable abnormality, normal gait, effusion R knee on inspection, TTP L IT band with pain with activation and stretching IT band, TTP trochanteric bursa, no bony TTP otherwise, normal strength and sensation in LEs, TTP along entire jt line R knee, neg lachman, neg ant/post drawer  PSYCH: pleasant and cooperative, no obvious depression or anxiety  ASSESSMENT AND PLAN:  Discussed the following assessment and plan:  Type 2 diabetes mellitus without complication - Plan: Hemoglobin A1c, CANCELED: Hemoglobin A1c  Essential hypertension - Plan: Basic metabolic panel, CANCELED: Basic metabolic panel  Hyperlipemia  Hip pain, left  Recurrent knee pain, right - Plan: Ambulatory referral to Orthopedic Surgery  Encounter for immunization  -HEP and nsaids for IT band/? Trochanteric bursitis -refer for knee pain per request -labs -lifestyle recs -flu shot -foot exam at follow up -trial off BP med -follow up in 1 month -Patient advised to return or notify a doctor immediately if symptoms worsen or persist or new concerns arise.  Patient Instructions  BEFORE YOU LEAVE: - IT band exercises -flu shot -labs -schedule follow up in 1 month  Stop BP medication and we will recheck at follow up  Exercises for hip and aleve once daily for 2 weeks  We placed a referral for you as discussed for the knee pain. It usually takes about 1-2 weeks to process and schedule this referral. If you have not heard from [place] regarding this appointment in 2 weeks please contact our office.      Colin Benton R.

## 2014-11-30 LAB — HEMOGLOBIN A1C
Hgb A1c MFr Bld: 6.3 % — ABNORMAL HIGH (ref <5.7)
MEAN PLASMA GLUCOSE: 134 mg/dL — AB (ref <117)

## 2014-12-11 ENCOUNTER — Ambulatory Visit (INDEPENDENT_AMBULATORY_CARE_PROVIDER_SITE_OTHER): Payer: PRIVATE HEALTH INSURANCE | Admitting: Internal Medicine

## 2014-12-11 ENCOUNTER — Telehealth: Payer: Self-pay | Admitting: Family Medicine

## 2014-12-11 ENCOUNTER — Encounter: Payer: Self-pay | Admitting: Internal Medicine

## 2014-12-11 VITALS — BP 130/90 | HR 79 | Temp 98.0°F | Wt 200.0 lb

## 2014-12-11 DIAGNOSIS — R142 Eructation: Secondary | ICD-10-CM | POA: Diagnosis not present

## 2014-12-11 DIAGNOSIS — R197 Diarrhea, unspecified: Secondary | ICD-10-CM | POA: Diagnosis not present

## 2014-12-11 NOTE — Patient Instructions (Addendum)
Limit  New meds .   Supplements at this time  Gall bladder  Can cause burping but doesn't seem like cause of the watery diarrhea.  Most times this is an infection  That resolveds on its own.  Within 2 weeks .  Exam is reassuring .  Get stool samples to check.  Can try immodium with caution to avoid rebound constipation for comfort .  Fu PCP Dr Maudie Mercury in not improved in another 7-10 days  Or as needed .

## 2014-12-11 NOTE — Progress Notes (Signed)
Pre visit review using our clinic review tool, if applicable. No additional management support is needed unless otherwise documented below in the visit note. 

## 2014-12-11 NOTE — Progress Notes (Signed)
Chief Complaint  Patient presents with  . Diarrhea    HPI: Patient Karen Hall  comes in today for SDA for  new problem evaluation. PCP na  Seen 12 days ago by pcp for dm Chronic disease management ....   Watery    Lasts hours   initialluy 8 am  At out filet and applesauce  Took new tablet essential oils  Later out hamburger  Took digestin  pepto didn't  Touch  Today had more form.   Gas .  No blood  trying to eat better . No travel.  Recently    Normal pattern  Constipation   Every 3-4 days  And firm.  Gas  And burping  Worse.   Has hx of ibs  Anxiety lipid  Ht  ROS: See pertinent positives and negatives per HPI.  Past Medical History  Diagnosis Date  . HYPERTENSION 10/18/2007    Qualifier: Diagnosis of  By: Sherlynn Stalls, CMA, [place]    . Irritable bowel syndrome 10/18/2007    Qualifier: Diagnosis of  By: Sarajane Jews MD, Ishmael Holter   . GERD 10/18/2007    Qualifier: Diagnosis of  By: Sherlynn Stalls, CMA, [place]    . Insomnia 03/12/2014  . Hyperlipemia 03/12/2014  . ANXIETY 10/18/2007    Qualifier: Diagnosis of  By: Sarajane Jews MD, Ishmael Holter   . Chronic low back pain   . Recurrent knee pain     R knee hx meniscal tear 2002 with repair    Family History  Problem Relation Age of Onset  . Cancer Mother     melanoma  . Cancer Father     liver    Social History   Social History  . Marital Status: Widowed    Spouse Name: N/A  . Number of Children: N/A  . Years of Education: N/A   Social History Main Topics  . Smoking status: Never Smoker   . Smokeless tobacco: None  . Alcohol Use: None  . Drug Use: None  . Sexual Activity: Not Asked   Other Topics Concern  . None   Social History Narrative   Work or School: Press photographer - [employer]       Home Situation: lives alone      Spiritual Beliefs: none      Lifestyle: no regular exercise; poor diet          Outpatient Prescriptions Prior to Visit  Medication Sig Dispense Refill  . atorvastatin (LIPITOR) 10 MG tablet Take 1 tablet (10 mg  total) by mouth daily. 30 tablet 11  . citalopram (CELEXA) 20 MG tablet TAKE 1 TABLET (20 MG TOTAL) BY MOUTH DAILY. 90 tablet 0  . diphenhydramine-acetaminophen (TYLENOL PM) 25-500 MG TABS Take 1 tablet by mouth at bedtime as needed.    . Homeopathic Products (ZICAM ALLERGY RELIEF) GEL Place into the nose.    . losartan-hydrochlorothiazide (HYZAAR) 50-12.5 MG per tablet Take 0.5 tablets by mouth daily. (Patient not taking: Reported on 12/11/2014) 45 tablet 3  . co-enzyme Q-10 30 MG capsule Take 30 mg by mouth daily.    Marland Kitchen zolpidem (AMBIEN CR) 6.25 MG CR tablet 1 tablet at bedtime as needed and only for severe sleep disorder. Use as little as possible. Needs office visit prior to further refills. 20 tablet 0   No facility-administered medications prior to visit.     EXAM:  BP 130/90 mmHg  Pulse 79  Temp(Src) 98 F (36.7 C) (Oral)  Wt 200 lb (90.719 kg)  Body mass index is 32.3 kg/(m^2).  GENERAL: vitals reviewed and listed above, alert, oriented, appears well hydrated and in no acute distress nl color lots of burping  HEENT: atraumatic, conjunctiva  clear, no obvious abnormalities on inspection of external nose and ears moist membranes  NECK: no obvious masses on inspection palpation  LUNGS: clear to auscultation bilaterally, no wheezes, rales or rhonchi, good air movement CV: HRRR, no clubbing cyanosis or  peripheral edema nl cap refill  Abdomen:  Sof,t normalto increase  bowel sounds without hepatosplenomegaly, no guarding rebound or masses no CVA tenderness MS: moves all extremities without noticeable focal  abnormality PSYCH: pleasant and cooperative, no obvious depression or anxiety Lab Results  Component Value Date   GLUCOSE 77 11/29/2014   CHOL 200 04/22/2014   TRIG 342.0* 04/22/2014   HDL 41.40 04/22/2014   LDLDIRECT 95.0 04/22/2014   NA 136 11/29/2014   K 4.0 11/29/2014   CL 105 11/29/2014   CREATININE 1.15* 11/29/2014   BUN 29* 11/29/2014   CO2 23 11/29/2014   HGBA1C  6.3* 11/29/2014   Wt Readings from Last 3 Encounters:  12/11/14 200 lb (90.719 kg)  11/29/14 204 lb 9.6 oz (92.806 kg)  07/04/14 201 lb 6.4 oz (91.354 kg)    ASSESSMENT AND PLAN:  Discussed the following assessment and plan:  Diarrhea - Plan: Fecal lactoferrin, Giardia/cryptosporidium (EIA), Stool culture  Burping Hx of gallstones ? Excessive burping today abs exam benign not typical  -Patient advised to return or notify health care team  if symptoms worsen ,persist or new concerns arise. Total visit 74mins > 50% spent counseling and coordinating care as indicated in above note and in instructions to patient .    Expectant management.  Patient Instructions  Limit  New meds .   Supplements at this time  Gall bladder  Can cause burping but doesn't seem like cause of the watery diarrhea.  Most times this is an infection  That resolveds on its own.  Within 2 weeks .  Exam is reassuring .  Get stool samples to check.  Can try immodium with caution to avoid rebound constipation for comfort .  Fu PCP Dr Maudie Mercury in not improved in another 7-10 days  Or as needed .     Standley Brooking. Panosh M.D.

## 2014-12-11 NOTE — Telephone Encounter (Signed)
Patient Name: Karen Hall DOB: 04/18/1955 Initial Comment caller states she has had diarrhea on and off since last wed - it is back today Nurse Assessment Nurse: Ronnald Ramp, RN, Miranda Date/Time (Eastern Time): 12/11/2014 11:51:21 AM Confirm and document reason for call. If symptomatic, describe symptoms. ---Caller states she has had diarrhea off and on for 1 week. Has the patient traveled out of the country within the last 30 days? ---No Does the patient require triage? ---Yes Related visit to physician within the last 2 weeks? ---No Does the PT have any chronic conditions? (i.e. diabetes, asthma, etc.) ---Yes List chronic conditions. ---IBS, GERD, High Cholesterol, HTN Guidelines Guideline Title Affirmed Question Affirmed Notes Diarrhea [1] SEVERE diarrhea (e.g., 7 or more times / day more than normal) AND [2] age > 60 years Final Disposition User See Physician within 4 Hours (or PCP triage) Ronnald Ramp, RN, Miranda Comments Appt scheduled for 2pm today with Dr. Regis Bill.

## 2015-01-02 ENCOUNTER — Ambulatory Visit (INDEPENDENT_AMBULATORY_CARE_PROVIDER_SITE_OTHER): Payer: PRIVATE HEALTH INSURANCE | Admitting: Family Medicine

## 2015-01-02 ENCOUNTER — Encounter: Payer: Self-pay | Admitting: Family Medicine

## 2015-01-02 VITALS — BP 128/88 | HR 84 | Temp 98.2°F | Ht 66.0 in | Wt 208.2 lb

## 2015-01-02 DIAGNOSIS — I10 Essential (primary) hypertension: Secondary | ICD-10-CM | POA: Diagnosis not present

## 2015-01-02 DIAGNOSIS — E669 Obesity, unspecified: Secondary | ICD-10-CM

## 2015-01-02 NOTE — Progress Notes (Signed)
HPI:  Karen Hall is a 59 yo F with DM, HTN, HLD and poor compliance here for a follow up visit regarding her blood pressure. She did improve her lifestyle and wanted to do a trial off of her blood pressure medication at the last visit. She denies: CP, SOb, DOE, swelling. She is seeing the orthopedic doctor for her knee and has a strained hamstring on the L and is doing PT, exercising and is working on eating a healthier diet.  ROS: See pertinent positives and negatives per HPI.  Past Medical History  Diagnosis Date  . HYPERTENSION 10/18/2007    Qualifier: Diagnosis of  By: Sherlynn Stalls, CMA, [place]    . Irritable bowel syndrome 10/18/2007    Qualifier: Diagnosis of  By: Sarajane Jews MD, Ishmael Holter   . GERD 10/18/2007    Qualifier: Diagnosis of  By: Sherlynn Stalls, CMA, [place]    . Insomnia 03/12/2014  . Hyperlipemia 03/12/2014  . ANXIETY 10/18/2007    Qualifier: Diagnosis of  By: Sarajane Jews MD, Ishmael Holter   . Chronic low back pain   . Recurrent knee pain     R knee hx meniscal tear 2002 with repair    Past Surgical History  Procedure Laterality Date  . Meniscus repair Right   . Breast surgery      fibroid  . Tonsillectomy      Family History  Problem Relation Age of Onset  . Cancer Mother     melanoma  . Cancer Father     liver    Social History   Social History  . Marital Status: Widowed    Spouse Name: N/A  . Number of Children: N/A  . Years of Education: N/A   Social History Main Topics  . Smoking status: Never Smoker   . Smokeless tobacco: None  . Alcohol Use: None  . Drug Use: None  . Sexual Activity: Not Asked   Other Topics Concern  . None   Social History Narrative   Work or School: Press photographer - [employer]       Home Situation: lives alone      Spiritual Beliefs: none      Lifestyle: no regular exercise; poor diet           Current outpatient prescriptions:  .  atorvastatin (LIPITOR) 10 MG tablet, Take 1 tablet (10 mg total) by mouth daily., Disp: 30 tablet, Rfl:  11 .  citalopram (CELEXA) 20 MG tablet, TAKE 1 TABLET (20 MG TOTAL) BY MOUTH DAILY., Disp: 90 tablet, Rfl: 0 .  diphenhydramine-acetaminophen (TYLENOL PM) 25-500 MG TABS, Take 1 tablet by mouth at bedtime as needed., Disp: , Rfl:  .  Homeopathic Products (ZICAM ALLERGY RELIEF) GEL, Place into the nose., Disp: , Rfl:  .  losartan-hydrochlorothiazide (HYZAAR) 50-12.5 MG per tablet, Take 0.5 tablets by mouth daily., Disp: 45 tablet, Rfl: 3 .  omeprazole (PRILOSEC) 40 MG capsule, Take 40 mg by mouth daily., Disp: , Rfl:   EXAM:  Filed Vitals:   01/02/15 1601  BP: 128/88  Pulse: 84  Temp: 98.2 F (36.8 C)    Body mass index is 33.62 kg/(m^2).  GENERAL: vitals reviewed and listed above, alert, oriented, appears well hydrated and in no acute distress  HEENT: atraumatic, conjunttiva clear, no obvious abnormalities on inspection of external nose and ears  NECK: no obvious masses on inspection  LUNGS: clear to auscultation bilaterally, no wheezes, rales or rhonchi, good air movement  CV: HRRR, no peripheral edema  MS: moves all extremities without noticeable abnormality  PSYCH: pleasant and cooperative, no obvious depression or anxiety  ASSESSMENT AND PLAN:  Discussed the following assessment and plan:  Essential hypertension  Obesity  -she wishes to continue off BP medications for now and monitor BP at home -advise 3 month f/u and call in interim if BP > 140/90 -Patient advised to return or notify a doctor immediately if symptoms worsen or persist or new concerns arise.  There are no Patient Instructions on file for this visit.   Colin Benton R.

## 2015-01-02 NOTE — Progress Notes (Signed)
Pre visit review using our clinic review tool, if applicable. No additional management support is needed unless otherwise documented below in the visit note. 

## 2015-02-11 ENCOUNTER — Ambulatory Visit (INDEPENDENT_AMBULATORY_CARE_PROVIDER_SITE_OTHER): Payer: PRIVATE HEALTH INSURANCE | Admitting: Emergency Medicine

## 2015-02-11 VITALS — BP 112/78 | HR 98 | Temp 98.8°F | Resp 17 | Ht 66.5 in | Wt 204.0 lb

## 2015-02-11 DIAGNOSIS — J209 Acute bronchitis, unspecified: Secondary | ICD-10-CM | POA: Diagnosis not present

## 2015-02-11 MED ORDER — HYDROCOD POLST-CPM POLST ER 10-8 MG/5ML PO SUER: 5.0000 mL | Freq: Two times a day (BID) | ORAL | Status: DC

## 2015-02-11 MED ORDER — AZITHROMYCIN 250 MG PO TABS: ORAL_TABLET | ORAL | Status: DC

## 2015-02-11 NOTE — Progress Notes (Signed)
Subjective:  Patient ID: Karen Hall, female    DOB: April 24, 1955  Age: 59 y.o. MRN: [id number]  CC: Cough; Fever; and Nasal Congestion   HPI [name] presents  She has a cough productive of mucopurulent sputum. She has no wheezing but has had some exertionalshortness of breath. She hasno fever chills. No nausea vomiting. No nasal congestion or postnasal drainage. No sore throat. Had no improvement with over-the-counter medication. History Helene has a past medical history of HYPERTENSION (10/18/2007); Irritable bowel syndrome (10/18/2007); GERD (10/18/2007); Insomnia (03/12/2014); Hyperlipemia (03/12/2014); ANXIETY (10/18/2007); Chronic low back pain; and Recurrent knee pain.   She has past surgical history that includes Meniscus repair (Right); Breast surgery; and Tonsillectomy.   Her  family history includes Cancer in her father, maternal grandfather, and mother; Heart disease in her paternal grandfather; Hyperlipidemia in her mother.  She   reports that she has never smoked. She does not have any smokeless tobacco history on file. Her alcohol and drug histories are not on file.  Outpatient Prescriptions Prior to Visit  Medication Sig Dispense Refill  . atorvastatin (LIPITOR) 10 MG tablet Take 1 tablet (10 mg total) by mouth daily. 30 tablet 11  . citalopram (CELEXA) 20 MG tablet TAKE 1 TABLET (20 MG TOTAL) BY MOUTH DAILY. 90 tablet 0  . diphenhydramine-acetaminophen (TYLENOL PM) 25-500 MG TABS Take 1 tablet by mouth at bedtime as needed.    . Homeopathic Products (ZICAM ALLERGY RELIEF) GEL Place into the nose.    . losartan-hydrochlorothiazide (HYZAAR) 50-12.5 MG per tablet Take 0.5 tablets by mouth daily. 45 tablet 3  . omeprazole (PRILOSEC) 40 MG capsule Take 40 mg by mouth daily.     No facility-administered medications prior to visit.    Social History   Social History  . Marital Status: Widowed    Spouse Name: N/A  . Number of Children: N/A  . Years of Education:  N/A   Social History Main Topics  . Smoking status: Never Smoker   . Smokeless tobacco: None  . Alcohol Use: None  . Drug Use: None  . Sexual Activity: Not Asked   Other Topics Concern  . None   Social History Narrative   Work or School: Press photographer - [employer]       Home Situation: lives alone      Spiritual Beliefs: none      Lifestyle: no regular exercise; poor diet           Review of Systems  Constitutional: Positive for fatigue. Negative for fever, chills and appetite change.  HENT: Negative for congestion, ear pain, postnasal drip, sinus pressure and sore throat.   Eyes: Negative for pain and redness.  Respiratory: Positive for cough and shortness of breath. Negative for wheezing.   Cardiovascular: Negative for leg swelling.  Gastrointestinal: Negative for nausea, vomiting, abdominal pain, diarrhea, constipation and blood in stool.  Endocrine: Negative for polyuria.  Genitourinary: Negative for dysuria, urgency, frequency and flank pain.  Musculoskeletal: Negative for gait problem.  Skin: Negative for rash.  Neurological: Negative for weakness and headaches.  Psychiatric/Behavioral: Negative for confusion and decreased concentration. The patient is not nervous/anxious.     Objective:  BP 112/78 mmHg  Pulse 98  Temp(Src) 98.8 F (37.1 C) (Oral)  Resp 17  Ht 5' 6.5" (1.689 m)  Wt 204 lb (92.534 kg)  BMI 32.44 kg/m2  SpO2 95%  Physical Exam  Constitutional: She is oriented to person, place, and time. She appears well-developed and  well-nourished. No distress.  HENT:  Head: Normocephalic and atraumatic.  Right Ear: External ear normal.  Left Ear: External ear normal.  Nose: Nose normal.  Eyes: Conjunctivae and EOM are normal. Pupils are equal, round, and reactive to light. No scleral icterus.  Neck: Normal range of motion. Neck supple. No tracheal deviation present.  Cardiovascular: Normal rate, regular rhythm and normal heart sounds.     Pulmonary/Chest: Effort normal. No respiratory distress. She has no wheezes. She has no rales.  Abdominal: She exhibits no mass. There is no tenderness. There is no rebound and no guarding.  Musculoskeletal: She exhibits no edema.  Lymphadenopathy:    She has no cervical adenopathy.  Neurological: She is alert and oriented to person, place, and time. Coordination normal.  Skin: Skin is warm and dry. No rash noted.  Psychiatric: She has a normal mood and affect. Her behavior is normal.      Assessment & Plan:   Clarke was seen today for cough, fever and nasal congestion.  Diagnoses and all orders for this visit:  Acute bronchitis, unspecified organism  Other orders -     azithromycin (ZITHROMAX) 250 MG tablet; Take 2 tabs PO x 1 dose, then 1 tab PO QD x 4 days -     chlorpheniramine-HYDROcodone (TUSSIONEX PENNKINETIC ER) 10-8 MG/5ML SUER; Take 5 mLs by mouth 2 (two) times daily.  I am having Ms. Watterson start on azithromycin and chlorpheniramine-HYDROcodone. I am also having her maintain her diphenhydramine-acetaminophen, ZICAM ALLERGY RELIEF, atorvastatin, losartan-hydrochlorothiazide, citalopram, omeprazole, and guaiFENesin.  Meds ordered this encounter  Medications  . guaiFENesin (MUCINEX) 600 MG 12 hr tablet    Sig: Take by mouth 2 (two) times daily.  Marland Kitchen azithromycin (ZITHROMAX) 250 MG tablet    Sig: Take 2 tabs PO x 1 dose, then 1 tab PO QD x 4 days    Dispense:  6 tablet    Refill:  0  . chlorpheniramine-HYDROcodone (TUSSIONEX PENNKINETIC ER) 10-8 MG/5ML SUER    Sig: Take 5 mLs by mouth 2 (two) times daily.    Dispense:  60 mL    Refill:  0    Appropriate red flag conditions were discussed with the patient as well as actions that should be taken.  Patient expressed his understanding.  Follow-up: Return if symptoms worsen or fail to improve.  Roselee Culver, MD

## 2015-02-11 NOTE — Patient Instructions (Signed)

## 2015-02-15 ENCOUNTER — Telehealth: Payer: Self-pay

## 2015-02-15 NOTE — Telephone Encounter (Signed)
Patient notified per Dr. Ouida Sills, she will need to come in for evaluation. She may need chest xray.

## 2015-02-15 NOTE — Telephone Encounter (Signed)
PATIENT STATES SHE SAW DR. Ouida Sills ABOUT 2 WEEKS AGO FOR A COUGH. IT IS NOT ANY BETTER AND SHE STILL HAS THE SOB FROM THE COUGHING AND NOW HER THROAT IS SORE. SHE WOULD LIKE TO GET REFILLS ON HER HYDROCODONE AND AZITHROMYCIN. BEST PHONE [phone] (CELL)  PHARMACY CHOICE IS [facility] ON [address]  [place]

## 2015-02-17 ENCOUNTER — Ambulatory Visit (INDEPENDENT_AMBULATORY_CARE_PROVIDER_SITE_OTHER): Payer: PRIVATE HEALTH INSURANCE | Admitting: Family Medicine

## 2015-02-17 ENCOUNTER — Ambulatory Visit (INDEPENDENT_AMBULATORY_CARE_PROVIDER_SITE_OTHER): Payer: PRIVATE HEALTH INSURANCE

## 2015-02-17 VITALS — BP 140/100 | HR 103 | Temp 98.6°F | Resp 17 | Ht 65.5 in | Wt 203.2 lb

## 2015-02-17 DIAGNOSIS — R059 Cough, unspecified: Secondary | ICD-10-CM

## 2015-02-17 DIAGNOSIS — R05 Cough: Secondary | ICD-10-CM

## 2015-02-17 DIAGNOSIS — J40 Bronchitis, not specified as acute or chronic: Secondary | ICD-10-CM

## 2015-02-17 LAB — POCT CBC
Granulocyte percent: 62.2 %G (ref 37–80)
HCT, POC: 41.3 % (ref 37.7–47.9)
HEMOGLOBIN: 13.7 g/dL (ref 12.2–16.2)
LYMPH, POC: 3.5 — AB (ref 0.6–3.4)
MCH: 27.9 pg (ref 27–31.2)
MCHC: 33.2 g/dL (ref 31.8–35.4)
MCV: 84 fL (ref 80–97)
MID (cbc): 1 — AB (ref 0–0.9)
MPV: 7.9 fL (ref 0–99.8)
PLATELET COUNT, POC: 305 10*3/uL (ref 142–424)
POC Granulocyte: 7.4 — AB (ref 2–6.9)
POC LYMPH PERCENT: 29.5 %L (ref 10–50)
POC MID %: 8.3 %M (ref 0–12)
RBC: 4.91 M/uL (ref 4.04–5.48)
RDW, POC: 13.3 %
WBC: 11.9 10*3/uL — AB (ref 4.6–10.2)

## 2015-02-17 MED ORDER — HYDROCOD POLST-CPM POLST ER 10-8 MG/5ML PO SUER: 5.0000 mL | Freq: Two times a day (BID) | ORAL | Status: DC

## 2015-02-17 MED ORDER — ALBUTEROL SULFATE HFA 108 (90 BASE) MCG/ACT IN AERS: 2.0000 | INHALATION_SPRAY | RESPIRATORY_TRACT | Status: DC | PRN

## 2015-02-17 MED ORDER — IPRATROPIUM BROMIDE 0.02 % IN SOLN
0.5000 mg | Freq: Once | RESPIRATORY_TRACT | Status: AC
  Administered 2015-02-17: 0.5 mg via RESPIRATORY_TRACT

## 2015-02-17 MED ORDER — BENZONATATE 200 MG PO CAPS: 200.0000 mg | ORAL_CAPSULE | Freq: Three times a day (TID) | ORAL | Status: DC | PRN

## 2015-02-17 MED ORDER — ALBUTEROL SULFATE 108 (90 BASE) MCG/ACT IN AEPB: 2.0000 | INHALATION_SPRAY | RESPIRATORY_TRACT | Status: DC | PRN

## 2015-02-17 MED ORDER — LEVOFLOXACIN 500 MG PO TABS: 500.0000 mg | ORAL_TABLET | Freq: Every day | ORAL | Status: DC

## 2015-02-17 MED ORDER — ALBUTEROL SULFATE (2.5 MG/3ML) 0.083% IN NEBU
2.5000 mg | INHALATION_SOLUTION | Freq: Once | RESPIRATORY_TRACT | Status: AC
  Administered 2015-02-17: 2.5 mg via RESPIRATORY_TRACT

## 2015-02-17 NOTE — Progress Notes (Addendum)
Subjective:    Patient ID: Karen Hall, female    DOB: Feb 20, 1956, 59 y.o.   MRN: [id number] By signing my name below, I, Judithe Modest, attest that this documentation has been prepared under the direction and in the presence of Delman Cheadle, MD. Electronically Signed: Judithe Modest, ER Scribe. 02/17/2015. 8:44 PM.  Chief Complaint  Patient presents with  . Follow-up    Cough, X 1 week  . Shortness of Breath    HPI HPI Comments: Karen Hall is a 59 y.o. female who presents to [facility] complaining of continued intermittently productive cough. She also has some lightheadedness when she stands up quickly. She has coughed to the point that she almost vomited two times today. She has had sickness induced asthma one time in the past. She was seen on 02/11/2015 for productive cough w/o wheezing fever or chills, but with some exertional SOB. She took delsom today at 5:30pm w/o relief.   Past Medical History  Diagnosis Date  . HYPERTENSION 10/18/2007    Qualifier: Diagnosis of  By: Sherlynn Stalls, CMA, [place]    . Irritable bowel syndrome 10/18/2007    Qualifier: Diagnosis of  By: Sarajane Jews MD, Ishmael Holter   . GERD 10/18/2007    Qualifier: Diagnosis of  By: Sherlynn Stalls, CMA, [place]    . Insomnia 03/12/2014  . Hyperlipemia 03/12/2014  . ANXIETY 10/18/2007    Qualifier: Diagnosis of  By: Sarajane Jews MD, Ishmael Holter   . Chronic low back pain   . Recurrent knee pain     R knee hx meniscal tear 2002 with repair   Allergies  Allergen Reactions  . Aspartame And Phenylalanine Other (See Comments)    Headache   Current Outpatient Prescriptions on File Prior to Visit  Medication Sig Dispense Refill  . atorvastatin (LIPITOR) 10 MG tablet Take 1 tablet (10 mg total) by mouth daily. 30 tablet 11  . citalopram (CELEXA) 20 MG tablet TAKE 1 TABLET (20 MG TOTAL) BY MOUTH DAILY. 90 tablet 0  . diphenhydramine-acetaminophen (TYLENOL PM) 25-500 MG TABS Take 1 tablet by mouth at bedtime as needed.    Marland Kitchen guaiFENesin (MUCINEX) 600 MG 12 hr  tablet Take by mouth 2 (two) times daily.    Marland Kitchen losartan-hydrochlorothiazide (HYZAAR) 50-12.5 MG per tablet Take 0.5 tablets by mouth daily. 45 tablet 3  . omeprazole (PRILOSEC) 40 MG capsule Take 40 mg by mouth daily.    Marland Kitchen azithromycin (ZITHROMAX) 250 MG tablet Take 2 tabs PO x 1 dose, then 1 tab PO QD x 4 days (Patient not taking: Reported on 02/17/2015) 6 tablet 0  . chlorpheniramine-HYDROcodone (TUSSIONEX PENNKINETIC ER) 10-8 MG/5ML SUER Take 5 mLs by mouth 2 (two) times daily. (Patient not taking: Reported on 02/17/2015) 60 mL 0  . Homeopathic Products (ZICAM ALLERGY RELIEF) GEL Place into the nose.     No current facility-administered medications on file prior to visit.    Review of Systems  Constitutional: Positive for fever, chills, diaphoresis, activity change and fatigue.  HENT: Positive for congestion.   Respiratory: Positive for cough and shortness of breath. Negative for chest tightness and wheezing.   Cardiovascular: Negative for chest pain, palpitations and leg swelling.  Gastrointestinal: Negative for nausea and vomiting.  Neurological: Positive for headaches. Negative for dizziness and syncope.  Hematological: Positive for adenopathy.  Psychiatric/Behavioral: Negative for sleep disturbance.       Objective:  BP 140/100 mmHg  Pulse 129  Temp(Src) 98.6 F (37 C) (Oral)  Resp 17  Ht  5' 5.5" (1.664 m)  Wt 203 lb 3.2 oz (92.171 kg)  BMI 33.29 kg/m2  SpO2 96%  Physical Exam  Constitutional: She is oriented to person, place, and time. She appears well-developed and well-nourished. No distress.  HENT:  Head: Normocephalic and atraumatic.  TMs normal, nares normal. Oropharynx with severe erythema.   Eyes: Pupils are equal, round, and reactive to light.  Neck: Neck supple.  Severe anterior cervical adenopathy.   Cardiovascular: Normal rate.   1+ edema in bilateral legs.  Pulmonary/Chest: Effort normal. No respiratory distress.  Musculoskeletal: Normal range of motion.    Neurological: She is alert and oriented to person, place, and time. Coordination normal.  Skin: Skin is warm and dry. She is not diaphoretic.  Psychiatric: She has a normal mood and affect. Her behavior is normal.  Nursing note and vitals reviewed.    [facility] reading (PRIMARY) by  Dr. Brigitte Pulse. CXR: elevated right hemidiaphragm, right basilar consolidation, ?esophagus being pushed left, small anterior effusion seen on the lateral  Dg Chest 2 View  02/17/2015  CLINICAL DATA:  Severe cough, dyspnea on exertion EXAM: CHEST  2 VIEW COMPARISON:  None. FINDINGS: Eventration of the right hemidiaphragm. Associated right basilar opacity, likely atelectasis. Left lung is clear. No pleural effusion or pneumothorax. The heart is normal in size. Visualized osseous structures are within normal limits. IMPRESSION: Eventration of the right hemidiaphragm. Associated right basilar opacity, likely atelectasis. Electronically Signed   By: Julian Hy M.D.   On: 02/17/2015 21:27   Results for orders placed or performed in visit on 02/17/15  POCT CBC  Result Value Ref Range   WBC 11.9 (A) 4.6 - 10.2 K/uL   Lymph, poc 3.5 (A) 0.6 - 3.4   POC LYMPH PERCENT 29.5 10 - 50 %L   MID (cbc) 1.0 (A) 0 - 0.9   POC MID % 8.3 0 - 12 %M   POC Granulocyte 7.4 (A) 2 - 6.9   Granulocyte percent 62.2 37 - 80 %G   RBC 4.91 4.04 - 5.48 M/uL   Hemoglobin 13.7 12.2 - 16.2 g/dL   HCT, POC 41.3 37.7 - 47.9 %   MCV 84.0 80 - 97 fL   MCH, POC 27.9 27 - 31.2 pg   MCHC 33.2 31.8 - 35.4 g/dL   RDW, POC 13.3 %   Platelet Count, POC 305 142 - 424 K/uL   MPV 7.9 0 - 99.8 fL   Assessment & Plan:   1. Cough   2. Bronchitis   Significantly worsening after completing zpack - suspect secondary illness - start levaquin.  Sig improved after alb neb in office so start alb q4 hrs. RTC immed if tachycardia cont - pt wearing watch which monitors. F/u w/ PCP this week to recheck sxs and vitals.  Orders Placed This Encounter  Procedures  . DG  Chest 2 View    Standing Status: Future     Number of Occurrences: 1     Standing Expiration Date: 02/17/2016    Order Specific Question:  Reason for Exam (SYMPTOM  OR DIAGNOSIS REQUIRED)    Answer:  severe cough and DOE    Order Specific Question:  Preferred imaging location?    Answer:  External  . Sedimentation Rate  . POCT CBC    Meds ordered this encounter  Medications  . albuterol (PROVENTIL) (2.5 MG/3ML) 0.083% nebulizer solution 2.5 mg    Sig:   . ipratropium (ATROVENT) nebulizer solution 0.5 mg    Sig:   .  DISCONTD: Albuterol Sulfate (PROAIR RESPICLICK) 123XX123 (90 BASE) MCG/ACT AEPB    Sig: Inhale 2 puffs into the lungs every 4 (four) hours as needed.    Dispense:  1 each    Refill:  2  . DISCONTD: albuterol (PROVENTIL HFA;VENTOLIN HFA) 108 (90 BASE) MCG/ACT inhaler    Sig: Inhale 2 puffs into the lungs every 4 (four) hours as needed for wheezing or shortness of breath (cough, shortness of breath or wheezing.).    Dispense:  1 Inhaler    Refill:  1  . levofloxacin (LEVAQUIN) 500 MG tablet    Sig: Take 1 tablet (500 mg total) by mouth daily.    Dispense:  7 tablet    Refill:  0  . Albuterol Sulfate (PROAIR RESPICLICK) 123XX123 (90 BASE) MCG/ACT AEPB    Sig: Inhale 2 puffs into the lungs every 4 (four) hours as needed.    Dispense:  1 each    Refill:  2  . albuterol (PROVENTIL HFA;VENTOLIN HFA) 108 (90 BASE) MCG/ACT inhaler    Sig: Inhale 2 puffs into the lungs every 4 (four) hours as needed for wheezing or shortness of breath (cough, shortness of breath or wheezing.).    Dispense:  1 Inhaler    Refill:  1  . chlorpheniramine-HYDROcodone (TUSSIONEX PENNKINETIC ER) 10-8 MG/5ML SUER    Sig: Take 5 mLs by mouth 2 (two) times daily.    Dispense:  120 mL    Refill:  0  . benzonatate (TESSALON) 200 MG capsule    Sig: Take 1 capsule (200 mg total) by mouth 3 (three) times daily as needed for cough.    Dispense:  40 capsule    Refill:  0    I personally performed the services  described in this documentation, which was scribed in my presence. The recorded information has been reviewed and considered, and addended by me as needed.  Delman Cheadle, MD MPH

## 2015-02-17 NOTE — Patient Instructions (Signed)
Bronchospasm, Adult  A bronchospasm is a spasm or tightening of the airways going into the lungs. During a bronchospasm breathing becomes more difficult because the airways get smaller. When this happens there can be coughing, a whistling sound when breathing (wheezing), and difficulty breathing. Bronchospasm is often associated with asthma, but not all patients who experience a bronchospasm have asthma.  CAUSES   A bronchospasm is caused by inflammation or irritation of the airways. The inflammation or irritation may be triggered by:   · Allergies (such as to animals, pollen, food, or mold). Allergens that cause bronchospasm may cause wheezing immediately after exposure or many hours later.    · Infection. Viral infections are believed to be the most common cause of bronchospasm.    · Exercise.    · Irritants (such as pollution, cigarette smoke, strong odors, aerosol sprays, and paint fumes).    · Weather changes. Winds increase molds and pollens in the air. Rain refreshes the air by washing irritants out. Cold air may cause inflammation.    · Stress and emotional upset.    SIGNS AND SYMPTOMS   · Wheezing.    · Excessive nighttime coughing.    · Frequent or severe coughing with a simple cold.    · Chest tightness.    · Shortness of breath.    DIAGNOSIS   Bronchospasm is usually diagnosed through a history and physical exam. Tests, such as chest X-rays, are sometimes done to look for other conditions.  TREATMENT   · Inhaled medicines can be given to open up your airways and help you breathe. The medicines can be given using either an inhaler or a nebulizer machine.  · Corticosteroid medicines may be given for severe bronchospasm, usually when it is associated with asthma.  HOME CARE INSTRUCTIONS   · Always have a plan prepared for seeking medical care. Know when to call your health care provider and local emergency services (911 in the [place] Know where you can access local emergency care.  · Only take medicines as  directed by your health care provider.  · If you were prescribed an inhaler or nebulizer machine, ask your health care provider to explain how to use it correctly. Always use a spacer with your inhaler if you were given one.  · It is necessary to remain calm during an attack. Try to relax and breathe more slowly.   · Control your home environment in the following ways:      Change your heating and air conditioning filter at least once a month.      Limit your use of fireplaces and wood stoves.    Do not smoke and do not allow smoking in your home.      Avoid exposure to perfumes and fragrances.      Get rid of pests (such as roaches and mice) and their droppings.      Throw away plants if you see mold on them.      Keep your house clean and dust free.      Replace carpet with wood, tile, or vinyl flooring. Carpet can trap dander and dust.      Use allergy-proof pillows, mattress covers, and box spring covers.      Wash bed sheets and blankets every week in hot water and dry them in a dryer.      Use blankets that are made of polyester or cotton.      Wash hands frequently.  SEEK MEDICAL CARE IF:   · You have muscle aches.    · You have chest pain.    · The sputum changes from clear or   white to yellow, green, gray, or bloody.    · The sputum you cough up gets thicker.    · There are problems that may be related to the medicine you are given, such as a rash, itching, swelling, or trouble breathing.    SEEK IMMEDIATE MEDICAL CARE IF:   · You have worsening wheezing and coughing even after taking your prescribed medicines.    · You have increased difficulty breathing.    · You develop severe chest pain.  MAKE SURE YOU:   · Understand these instructions.  · Will watch your condition.  · Will get help right away if you are not doing well or get worse.     This information is not intended to replace advice given to you by your health care provider. Make sure you discuss any questions you have with your health care  provider.     Document Released: 03/04/2003 Document Revised: 03/22/2014 Document Reviewed: 08/21/2012  Elsevier Interactive Patient Education ©2016 [employer]

## 2015-02-19 LAB — SEDIMENTATION RATE: SED RATE: 32 mm/h — AB (ref 0–30)

## 2015-02-20 ENCOUNTER — Encounter: Payer: Self-pay | Admitting: Family Medicine

## 2015-02-20 ENCOUNTER — Encounter (HOSPITAL_COMMUNITY): Payer: Self-pay

## 2015-02-20 ENCOUNTER — Emergency Department (HOSPITAL_COMMUNITY): Payer: PRIVATE HEALTH INSURANCE

## 2015-02-20 ENCOUNTER — Ambulatory Visit (INDEPENDENT_AMBULATORY_CARE_PROVIDER_SITE_OTHER): Payer: PRIVATE HEALTH INSURANCE | Admitting: Family Medicine

## 2015-02-20 ENCOUNTER — Emergency Department (HOSPITAL_COMMUNITY)
Admission: EM | Admit: 2015-02-20 | Discharge: 2015-02-20 | Disposition: A | Discharge status: Home / Self Care | Payer: PRIVATE HEALTH INSURANCE | Attending: Emergency Medicine | Admitting: Emergency Medicine

## 2015-02-20 VITALS — BP 92/70 | HR 104 | Temp 98.1°F

## 2015-02-20 DIAGNOSIS — F419 Anxiety disorder, unspecified: Secondary | ICD-10-CM | POA: Diagnosis not present

## 2015-02-20 DIAGNOSIS — I10 Essential (primary) hypertension: Secondary | ICD-10-CM | POA: Insufficient documentation

## 2015-02-20 DIAGNOSIS — J9600 Acute respiratory failure, unspecified whether with hypoxia or hypercapnia: Secondary | ICD-10-CM | POA: Diagnosis not present

## 2015-02-20 DIAGNOSIS — K219 Gastro-esophageal reflux disease without esophagitis: Secondary | ICD-10-CM | POA: Insufficient documentation

## 2015-02-20 DIAGNOSIS — Z79899 Other long term (current) drug therapy: Secondary | ICD-10-CM | POA: Insufficient documentation

## 2015-02-20 DIAGNOSIS — Z792 Long term (current) use of antibiotics: Secondary | ICD-10-CM | POA: Insufficient documentation

## 2015-02-20 DIAGNOSIS — J40 Bronchitis, not specified as acute or chronic: Secondary | ICD-10-CM

## 2015-02-20 DIAGNOSIS — R0602 Shortness of breath: Secondary | ICD-10-CM | POA: Diagnosis not present

## 2015-02-20 DIAGNOSIS — G8929 Other chronic pain: Secondary | ICD-10-CM | POA: Insufficient documentation

## 2015-02-20 DIAGNOSIS — E785 Hyperlipidemia, unspecified: Secondary | ICD-10-CM | POA: Insufficient documentation

## 2015-02-20 LAB — BASIC METABOLIC PANEL
Anion gap: 10 (ref 5–15)
BUN: 19 mg/dL (ref 6–20)
CALCIUM: 9.5 mg/dL (ref 8.9–10.3)
CO2: 23 mmol/L (ref 22–32)
Chloride: 104 mmol/L (ref 101–111)
Creatinine, Ser: 1.4 mg/dL — ABNORMAL HIGH (ref 0.44–1.00)
GFR calc non Af Amer: 40 mL/min — ABNORMAL LOW (ref >60)
GFR, EST AFRICAN AMERICAN: 46 mL/min — AB (ref >60)
GLUCOSE: 112 mg/dL — AB (ref 65–99)
POTASSIUM: 3.7 mmol/L (ref 3.5–5.1)
SODIUM: 137 mmol/L (ref 135–145)

## 2015-02-20 LAB — CBC WITH DIFFERENTIAL/PLATELET
Basophils Absolute: 0.1 10*3/uL (ref 0.0–0.1)
Basophils Relative: 1 %
Eosinophils Absolute: 0.3 10*3/uL (ref 0.0–0.7)
Eosinophils Relative: 4 %
HEMATOCRIT: 41.1 % (ref 36.0–46.0)
HEMOGLOBIN: 13.5 g/dL (ref 12.0–15.0)
LYMPHS PCT: 27 %
Lymphs Abs: 2.4 10*3/uL (ref 0.7–4.0)
MCH: 28.2 pg (ref 26.0–34.0)
MCHC: 32.8 g/dL (ref 30.0–36.0)
MCV: 86 fL (ref 78.0–100.0)
MONO ABS: 0.7 10*3/uL (ref 0.1–1.0)
Monocytes Relative: 7 %
NEUTROS ABS: 5.6 10*3/uL (ref 1.7–7.7)
NEUTROS PCT: 61 %
Platelets: 275 10*3/uL (ref 150–400)
RBC: 4.78 MIL/uL (ref 3.87–5.11)
RDW: 13.4 % (ref 11.5–15.5)
WBC: 9 10*3/uL (ref 4.0–10.5)

## 2015-02-20 LAB — D-DIMER, QUANTITATIVE: D-Dimer, Quant: 2.84 ug/mL-FEU — ABNORMAL HIGH (ref 0.00–0.50)

## 2015-02-20 LAB — I-STAT TROPONIN, ED: Troponin i, poc: 0 ng/mL (ref 0.00–0.08)

## 2015-02-20 LAB — BRAIN NATRIURETIC PEPTIDE: B NATRIURETIC PEPTIDE 5: 12.3 pg/mL (ref 0.0–100.0)

## 2015-02-20 MED ORDER — DEXAMETHASONE SODIUM PHOSPHATE 10 MG/ML IJ SOLN: 10.0000 mg | Freq: Once | INTRAMUSCULAR | Status: DC

## 2015-02-20 MED ORDER — DEXAMETHASONE SODIUM PHOSPHATE 10 MG/ML IJ SOLN
10.0000 mg | Freq: Once | INTRAMUSCULAR | Status: AC
  Administered 2015-02-20: 10 mg via INTRAMUSCULAR
  Filled 2015-02-20: qty 1

## 2015-02-20 MED ORDER — IPRATROPIUM-ALBUTEROL 0.5-2.5 (3) MG/3ML IN SOLN
3.0000 mL | Freq: Four times a day (QID) | RESPIRATORY_TRACT | Status: DC
  Administered 2015-02-20: 3 mL via RESPIRATORY_TRACT

## 2015-02-20 MED ORDER — IOHEXOL 350 MG/ML SOLN
100.0000 mL | Freq: Once | INTRAVENOUS | Status: AC | PRN
  Administered 2015-02-20: 80 mL via INTRAVENOUS

## 2015-02-20 NOTE — ED Notes (Signed)
Pt sent here by MD.  Seen 2-3 times since Thanksgiving for cough/cold like symptoms with shortness of breath.  Pt labored when ambulating.

## 2015-02-20 NOTE — Discharge Instructions (Signed)
How to Use an Inhaler °Proper inhaler technique is very important. Good technique ensures that the medicine reaches the lungs. Poor technique results in depositing the medicine on the tongue and back of the throat rather than in the airways. If you do not use the inhaler with good technique, the medicine will not help you. °STEPS TO FOLLOW IF USING AN INHALER WITHOUT AN EXTENSION TUBE °1. Remove the cap from the inhaler. °2. If you are using the inhaler for the first time, you will need to prime it. Shake the inhaler for 5 seconds and release four puffs into the air, away from your face. Ask your health care provider or pharmacist if you have questions about priming your inhaler. °3. Shake the inhaler for 5 seconds before each breath in (inhalation). °4. Position the inhaler so that the top of the canister faces up. °5. Put your index finger on the top of the medicine canister. Your thumb supports the bottom of the inhaler. °6. Open your mouth. °7. Either place the inhaler between your teeth and place your lips tightly around the mouthpiece, or hold the inhaler 1-2 inches away from your open mouth. If you are unsure of which technique to use, ask your health care provider. °8. Breathe out (exhale) normally and as completely as possible. °9. Press the canister down with your index finger to release the medicine. °10. At the same time as the canister is pressed, inhale deeply and slowly until your lungs are completely filled. This should take 4-6 seconds. Keep your tongue down. °11. Hold the medicine in your lungs for 5-10 seconds (10 seconds is best). This helps the medicine get into the small airways of your lungs. °12. Breathe out slowly, through pursed lips. Whistling is an example of pursed lips. °13. Wait at least 15-30 seconds between puffs. Continue with the above steps until you have taken the number of puffs your health care provider has ordered. Do not use the inhaler more than your health care provider  tells you. °14. Replace the cap on the inhaler. °15. Follow the directions from your health care provider or the inhaler insert for cleaning the inhaler. °STEPS TO FOLLOW IF USING AN INHALER WITH AN EXTENSION (SPACER) °1. Remove the cap from the inhaler. °2. If you are using the inhaler for the first time, you will need to prime it. Shake the inhaler for 5 seconds and release four puffs into the air, away from your face. Ask your health care provider or pharmacist if you have questions about priming your inhaler. °3. Shake the inhaler for 5 seconds before each breath in (inhalation). °4. Place the open end of the spacer onto the mouthpiece of the inhaler. °5. Position the inhaler so that the top of the canister faces up and the spacer mouthpiece faces you. °6. Put your index finger on the top of the medicine canister. Your thumb supports the bottom of the inhaler and the spacer. °7. Breathe out (exhale) normally and as completely as possible. °8. Immediately after exhaling, place the spacer between your teeth and into your mouth. Close your lips tightly around the spacer. °9. Press the canister down with your index finger to release the medicine. °10. At the same time as the canister is pressed, inhale deeply and slowly until your lungs are completely filled. This should take 4-6 seconds. Keep your tongue down and out of the way. °11. Hold the medicine in your lungs for 5-10 seconds (10 seconds is best). This helps the   medicine get into the small airways of your lungs. Exhale. °12. Repeat inhaling deeply through the spacer mouthpiece. Again hold that breath for up to 10 seconds (10 seconds is best). Exhale slowly. If it is difficult to take this second deep breath through the spacer, breathe normally several times through the spacer. Remove the spacer from your mouth. °13. Wait at least 15-30 seconds between puffs. Continue with the above steps until you have taken the number of puffs your health care provider has  ordered. Do not use the inhaler more than your health care provider tells you. °14. Remove the spacer from the inhaler, and place the cap on the inhaler. °15. Follow the directions from your health care provider or the inhaler insert for cleaning the inhaler and spacer. °If you are using different kinds of inhalers, use your quick relief medicine to open the airways 10-15 minutes before using a steroid if instructed to do so by your health care provider. If you are unsure which inhalers to use and the order of using them, ask your health care provider, nurse, or respiratory therapist. °If you are using a steroid inhaler, always rinse your mouth with water after your last puff, then gargle and spit out the water. Do not swallow the water. °AVOID: °· Inhaling before or after starting the spray of medicine. It takes practice to coordinate your breathing with triggering the spray. °· Inhaling through the nose (rather than the mouth) when triggering the spray. °HOW TO DETERMINE IF YOUR INHALER IS FULL OR NEARLY EMPTY °You cannot know when an inhaler is empty by shaking it. A few inhalers are now being made with dose counters. Ask your health care provider for a prescription that has a dose counter if you feel you need that extra help. If your inhaler does not have a counter, ask your health care provider to help you determine the date you need to refill your inhaler. Write the refill date on a calendar or your inhaler canister. Refill your inhaler 7-10 days before it runs out. Be sure to keep an adequate supply of medicine. This includes making sure it is not expired, and that you have a spare inhaler.  °SEEK MEDICAL CARE IF:  °· Your symptoms are only partially relieved with your inhaler. °· You are having trouble using your inhaler. °· You have some increase in phlegm. °SEEK IMMEDIATE MEDICAL CARE IF:  °· You feel little or no relief with your inhalers. You are still wheezing and are feeling shortness of breath or  tightness in your chest or both. °· You have dizziness, headaches, or a fast heart rate. °· You have chills, fever, or night sweats. °· You have a noticeable increase in phlegm production, or there is blood in the phlegm. °MAKE SURE YOU:  °· Understand these instructions. °· Will watch your condition. °· Will get help right away if you are not doing well or get worse. °  °This information is not intended to replace advice given to you by your health care provider. Make sure you discuss any questions you have with your health care provider. °  °Document Released: 02/27/2000 Document Revised: 12/20/2012 Document Reviewed: 09/28/2012 °Elsevier Interactive Patient Education ©2016 [employer] ° °

## 2015-02-20 NOTE — ED Provider Notes (Signed)
CSN: [id number]     Arrival date & time 02/20/15  1407 History   First MD Initiated Contact with Patient 02/20/15 1526     Chief Complaint  Patient presents with  . Shortness of Breath      HPI Patient presents from [place] [facility].  Has had 2-3 week history of bronchitis and treated with 2 rounds of antibiotics.  Is currently taking the second round.  Has shortness of breath when she gets up.  Has had initial productive cough but now cough is persistent but nonproductive.  Denies any fever or chest pain.  Denies any leg swelling.  Has no history of asthma.  Symptoms began as upper respiratory symptoms around Thanksgiving. Past Medical History  Diagnosis Date  . HYPERTENSION 10/18/2007    Qualifier: Diagnosis of  By: Sherlynn Stalls, CMA, [place]    . Irritable bowel syndrome 10/18/2007    Qualifier: Diagnosis of  By: Sarajane Jews MD, Ishmael Holter   . GERD 10/18/2007    Qualifier: Diagnosis of  By: Sherlynn Stalls, CMA, [place]    . Insomnia 03/12/2014  . Hyperlipemia 03/12/2014  . ANXIETY 10/18/2007    Qualifier: Diagnosis of  By: Sarajane Jews MD, Ishmael Holter   . Chronic low back pain   . Recurrent knee pain     R knee hx meniscal tear 2002 with repair   Past Surgical History  Procedure Laterality Date  . Meniscus repair Right   . Breast surgery      fibroid  . Tonsillectomy     Family History  Problem Relation Age of Onset  . Cancer Mother     melanoma  . Hyperlipidemia Mother   . Cancer Father     liver  . Cancer Maternal Grandfather   . Heart disease Paternal Grandfather    Social History  Substance Use Topics  . Smoking status: Never Smoker   . Smokeless tobacco: None  . Alcohol Use: None   OB History    No data available     Review of Systems  All other systems reviewed and are negative.     Allergies  Aspartame and phenylalanine  Home Medications   Prior to Admission medications   Medication Sig Start Date End Date Taking? Authorizing Provider  albuterol (PROVENTIL HFA;VENTOLIN HFA) 108 (90  BASE) MCG/ACT inhaler Inhale 2 puffs into the lungs every 4 (four) hours as needed for wheezing or shortness of breath (cough, shortness of breath or wheezing.). 02/17/15  Yes Shawnee Knapp, MD  Albuterol Sulfate (PROAIR RESPICLICK) 123XX123 (90 BASE) MCG/ACT AEPB Inhale 2 puffs into the lungs every 4 (four) hours as needed. 02/17/15  Yes Shawnee Knapp, MD  atorvastatin (LIPITOR) 10 MG tablet Take 1 tablet (10 mg total) by mouth daily. 03/14/14  Yes [name], DO  benzonatate (TESSALON) 200 MG capsule Take 1 capsule (200 mg total) by mouth 3 (three) times daily as needed for cough. 02/17/15  Yes Shawnee Knapp, MD  chlorpheniramine-HYDROcodone [facility] PENNKINETIC ER) 10-8 MG/5ML SUER Take 5 mLs by mouth 2 (two) times daily. 02/17/15  Yes Shawnee Knapp, MD  citalopram (CELEXA) 20 MG tablet TAKE 1 TABLET (20 MG TOTAL) BY MOUTH DAILY. 11/28/14  Yes [name], DO  diphenhydramine-acetaminophen (TYLENOL PM) 25-500 MG TABS Take 1 tablet by mouth at bedtime as needed (sleep/pain).    Yes Historical Provider, MD  levofloxacin (LEVAQUIN) 500 MG tablet Take 1 tablet (500 mg total) by mouth daily. 02/17/15  Yes Shawnee Knapp, MD  losartan-hydrochlorothiazide (  HYZAAR) 50-12.5 MG per tablet Take 0.5 tablets by mouth daily. 04/22/14  Yes [name], DO  omeprazole (PRILOSEC) 40 MG capsule Take 40 mg by mouth daily.   Yes Historical Provider, MD   BP 160/108 mmHg  Pulse 130  Temp(Src) 98.2 F (36.8 C) (Oral)  Resp 22  SpO2 96% Physical Exam  Constitutional: She is oriented to person, place, and time. She appears well-developed and well-nourished. No distress.  HENT:  Head: Normocephalic and atraumatic.  Eyes: Pupils are equal, round, and reactive to light.  Neck: Normal range of motion.  Cardiovascular: Intact distal pulses.   Pulmonary/Chest: Effort normal. No respiratory distress. She has no wheezes.  Abdominal: Soft. Normal appearance. She exhibits no distension.  Musculoskeletal: Normal range of motion. She exhibits no  edema.  Neurological: She is alert and oriented to person, place, and time. No cranial nerve deficit.  Skin: Skin is warm and dry. No rash noted.  Psychiatric: She has a normal mood and affect. Her behavior is normal.  Nursing note and vitals reviewed.   ED Course  Procedures (including critical care time) Medications  iohexol (OMNIPAQUE) 350 MG/ML injection 100 mL (80 mLs Intravenous Contrast Given 02/20/15 1904)  dexamethasone (DECADRON) injection 10 mg (10 mg Intramuscular Given 02/20/15 1943)    Labs Review Labs Reviewed  BASIC METABOLIC PANEL - Abnormal; Notable for the following:    Glucose, Bld 112 (*)    Creatinine, Ser 1.40 (*)    GFR calc non Af Amer 40 (*)    GFR calc Af Amer 46 (*)    All other components within normal limits  D-DIMER, QUANTITATIVE (NOT AT [facility]) - Abnormal; Notable for the following:    D-Dimer, Quant 2.84 (*)    All other components within normal limits  CBC WITH DIFFERENTIAL/PLATELET  BRAIN NATRIURETIC PEPTIDE  I-STAT TROPOININ, ED    Imaging Review Dg Chest 2 View  02/20/2015  CLINICAL DATA:  Dry cough, shortness of breath, dizziness and weakness for 2 weeks EXAM: CHEST  2 VIEW COMPARISON:  02/17/2015 FINDINGS: Cardiomediastinal silhouette is stable. There is chronic elevation of the right hemidiaphragm. Stable right basilar atelectasis. No acute infiltrate or pulmonary edema. IMPRESSION: No active cardiopulmonary disease. Again noted chronic elevation of the right hemidiaphragm with right basilar atelectasis. Electronically Signed   By: Lahoma Crocker M.D.   On: 02/20/2015 15:15   Ct Angio Chest Pe W/cm &/or Wo Cm  02/20/2015  CLINICAL DATA:  Acute onset of dry cough and respiratory distress. Near syncope. Initial encounter. EXAM: CT ANGIOGRAPHY CHEST WITH CONTRAST TECHNIQUE: Multidetector CT imaging of the chest was performed using the standard protocol during bolus administration of intravenous contrast. Multiplanar CT image reconstructions and MIPs  were obtained to evaluate the vascular anatomy. CONTRAST:  49mL OMNIPAQUE IOHEXOL 350 MG/ML SOLN COMPARISON:  Chest radiograph performed earlier today at 2:56 p.m. FINDINGS: There is no evidence of pulmonary embolus. Mild right basilar airspace opacity likely reflects atelectasis. Minimal left basilar atelectasis is seen. There is no evidence of pleural effusion or pneumothorax. No masses are identified; no abnormal focal contrast enhancement is seen. The mediastinum is unremarkable in appearance. No mediastinal lymphadenopathy is seen. Minimal nodularity along the anterior mediastinum is thought to reflect scarring. Trace pericardial fluid remains within normal limits. The great vessels are grossly unremarkable in appearance. No axillary lymphadenopathy is seen. The thyroid gland is unremarkable in appearance. The visualized portions of the liver and spleen are unremarkable. The visualized portions of the pancreas, gallbladder, stomach,  adrenal glands and kidneys are within normal limits. No acute osseous abnormalities are seen. Review of the MIP images confirms the above findings. IMPRESSION: 1. No evidence of pulmonary embolus. 2. Mild right basilar airspace opacity likely reflects atelectasis. Minimal left basilar atelectasis noted. 3. Minimal nodularity along the anterior mediastinum is thought to reflect scarring. Electronically Signed   By: Garald Balding M.D.   On: 02/20/2015 19:28   I have personally reviewed and evaluated these images and lab results as part of my medical decision-making.   EKG Interpretation   Date/Time:  Thursday February 20 2015 14:33:28 EST Ventricular Rate:  82 PR Interval:  178 QRS Duration: 85 QT Interval:  476 QTC Calculation: 556 R Axis:   40 Text Interpretation:  Sinus rhythm Low voltage, precordial leads Prolonged  QT interval No previous tracing Confirmed by Audie Pinto  MD, Destin Vinsant ([id number]) on  02/20/2015 3:55:23 PM      MDM   Final diagnoses:  Bronchitis         Leonard Schwartz, MD 02/20/15 2022

## 2015-02-20 NOTE — Progress Notes (Signed)
Pre visit review using our clinic review tool, if applicable. No additional management support is needed unless otherwise documented below in the visit note. 

## 2015-02-20 NOTE — Progress Notes (Signed)
HPI:  -started: 3 weeks ago with a "cold" -symptoms:nasal congestion, sore throat, cough, wheeze, SOB - reports seen in [facility] 3 weeks ago and got zpack - did a little better then worse again about 4-5 days ago with productive cough, SOB, chest pressure, wheeze, nasal congestion, dizziness and SOB and went to ucc again 12/5 with labs, CXR and given levaquin -reports instead of doing better she feels worse, feels like will "black out" every time she walks even a short distance, SOB, cough, chills, malaise -alb helps and nebs helped in [facility] -12/5 [facility] review of studies shows xray with r basilar opacity per read, mildly elevated wbc, elevated ESR -sick contacts/travel/risks: denies flu exposure, tick exposure or or Ebola risks -taking levaquin, restarted BP medications and has been using albuterol daily but not helping   ROS: See pertinent positives and negatives per HPI.  Past Medical History  Diagnosis Date  . HYPERTENSION 10/18/2007    Qualifier: Diagnosis of  By: Sherlynn Stalls, CMA, [place]    . Irritable bowel syndrome 10/18/2007    Qualifier: Diagnosis of  By: Sarajane Jews MD, Ishmael Holter   . GERD 10/18/2007    Qualifier: Diagnosis of  By: Sherlynn Stalls, CMA, [place]    . Insomnia 03/12/2014  . Hyperlipemia 03/12/2014  . ANXIETY 10/18/2007    Qualifier: Diagnosis of  By: Sarajane Jews MD, Ishmael Holter   . Chronic low back pain   . Recurrent knee pain     R knee hx meniscal tear 2002 with repair    Past Surgical History  Procedure Laterality Date  . Meniscus repair Right   . Breast surgery      fibroid  . Tonsillectomy      Family History  Problem Relation Age of Onset  . Cancer Mother     melanoma  . Hyperlipidemia Mother   . Cancer Father     liver  . Cancer Maternal Grandfather   . Heart disease Paternal Grandfather     Social History   Social History  . Marital Status: Widowed    Spouse Name: N/A  . Number of Children: N/A  . Years of Education: N/A   Social History Main Topics  . Smoking status: Never  Smoker   . Smokeless tobacco: None  . Alcohol Use: None  . Drug Use: None  . Sexual Activity: Not Asked   Other Topics Concern  . None   Social History Narrative   Work or School: [employer]       Home Situation: lives alone      Spiritual Beliefs: none      Lifestyle: no regular exercise; poor diet           Current outpatient prescriptions:  .  albuterol (PROVENTIL HFA;VENTOLIN HFA) 108 (90 BASE) MCG/ACT inhaler, Inhale 2 puffs into the lungs every 4 (four) hours as needed for wheezing or shortness of breath (cough, shortness of breath or wheezing.)., Disp: 1 Inhaler, Rfl: 1 .  Albuterol Sulfate (PROAIR RESPICLICK) 086 (90 BASE) MCG/ACT AEPB, Inhale 2 puffs into the lungs every 4 (four) hours as needed., Disp: 1 each, Rfl: 2 .  atorvastatin (LIPITOR) 10 MG tablet, Take 1 tablet (10 mg total) by mouth daily., Disp: 30 tablet, Rfl: 11 .  benzonatate (TESSALON) 200 MG capsule, Take 1 capsule (200 mg total) by mouth 3 (three) times daily as needed for cough., Disp: 40 capsule, Rfl: 0 .  chlorpheniramine-HYDROcodone (TUSSIONEX PENNKINETIC ER) 10-8 MG/5ML SUER, Take 5 mLs by  mouth 2 (two) times daily., Disp: 120 mL, Rfl: 0 .  citalopram (CELEXA) 20 MG tablet, TAKE 1 TABLET (20 MG TOTAL) BY MOUTH DAILY., Disp: 90 tablet, Rfl: 0 .  diphenhydramine-acetaminophen (TYLENOL PM) 25-500 MG TABS, Take 1 tablet by mouth at bedtime as needed., Disp: , Rfl:  .  guaiFENesin (MUCINEX) 600 MG 12 hr tablet, Take by mouth 2 (two) times daily., Disp: , Rfl:  .  Homeopathic Products (ZICAM ALLERGY RELIEF) GEL, Place into the nose., Disp: , Rfl:  .  levofloxacin (LEVAQUIN) 500 MG tablet, Take 1 tablet (500 mg total) by mouth daily., Disp: 7 tablet, Rfl: 0 .  losartan-hydrochlorothiazide (HYZAAR) 50-12.5 MG per tablet, Take 0.5 tablets by mouth daily., Disp: 45 tablet, Rfl: 3 .  omeprazole (PRILOSEC) 40 MG capsule, Take 40 mg by mouth daily., Disp: , Rfl:   Current  facility-administered medications:  .  ipratropium-albuterol (DUONEB) 0.5-2.5 (3) MG/3ML nebulizer solution 3 mL, 3 mL, Nebulization, Q6H, [name], DO, 3 mL at 02/20/15 1338  EXAM:  Filed Vitals:   02/20/15 1253  BP: 92/70  Pulse: 104  Temp: 98.1 F (36.7 C)    There is no weight on file to calculate BMI.  GENERAL: vitals reviewed and listed above, alert, oriented, appears well hydrated and in no acute distress  HEENT: atraumatic, conjunttiva clear, no obvious abnormalities on inspection of external nose and ears, normal appearance of ear canals and TMs, clear nasal congestion, mild post oropharyngeal erythema with PND, no tonsillar edema or exudate, no sinus TTP  NECK: no obvious masses on inspection  LUNGS: scattered wheeze, no rales or rhonchi,  elevated rr and some signs of mild resp distress on exam, O2 sats 91-95%, 91-92 % consistently after breathing treatment  CV: mild tachy, no peripheral edema  MS: moves all extremities without noticeable abnormality  PSYCH: pleasant and cooperative, no obvious depression or anxiety  ASSESSMENT AND PLAN:  Discussed the following assessment and plan:  Acute respiratory failure, unspecified whether with hypoxia or hypercapnia (HCC) - Plan: ipratropium-albuterol (DUONEB) 0.5-2.5 (3) MG/3ML nebulizer solution 3 mL  SOB (shortness of breath)  -we discussed possible serious and likely etiologies, workup and treatment, treatment risks and return precautions - resp infection likely -she is not feeling better after resp treatment and levaquin - likely would benefit for steroids, but she does not feel well enough to return home and is showing signs of resp distress despite breathing treatment -she opted for eval and treatment in emergency room for r/o other etiologies and treatment in monitored environment -offered O2 and transport - she felt more comfortable with sister taking her, advised staff to notify ED personel -of course, we  advised [place]  to return or notify a doctor immediately if symptoms worsen or persist or new concerns arise.  -of course, we advised to return or notify a doctor immediately if symptoms worsen or persist or new concerns arise.    There are no Patient Instructions on file for this visit.   Colin Benton R.

## 2015-02-20 NOTE — ED Notes (Signed)
RN getting labs 

## 2015-02-20 NOTE — ED Notes (Signed)
[facility] called, pt has been treated twice already for bronchitis. Today at pcp was in respiraotry distress, "feels like she is going to black out". Gave duoneb, O2 sat was only 91% after treatment.

## 2015-02-21 ENCOUNTER — Encounter: Payer: Self-pay | Admitting: Family Medicine

## 2015-03-03 ENCOUNTER — Other Ambulatory Visit: Payer: Self-pay | Admitting: Family Medicine

## 2015-03-24 ENCOUNTER — Other Ambulatory Visit: Payer: Self-pay | Admitting: Family Medicine

## 2015-03-31 ENCOUNTER — Ambulatory Visit (INDEPENDENT_AMBULATORY_CARE_PROVIDER_SITE_OTHER): Payer: PRIVATE HEALTH INSURANCE | Admitting: Family Medicine

## 2015-03-31 ENCOUNTER — Encounter: Payer: Self-pay | Admitting: Family Medicine

## 2015-03-31 VITALS — BP 120/82 | HR 101 | Temp 98.1°F | Ht 65.5 in | Wt 206.0 lb

## 2015-03-31 DIAGNOSIS — J988 Other specified respiratory disorders: Secondary | ICD-10-CM | POA: Diagnosis not present

## 2015-03-31 MED ORDER — DOXYCYCLINE HYCLATE 100 MG PO TABS: 100.0000 mg | ORAL_TABLET | Freq: Two times a day (BID) | ORAL | Status: DC

## 2015-03-31 MED ORDER — PREDNISONE 20 MG PO TABS: 40.0000 mg | ORAL_TABLET | Freq: Every day | ORAL | Status: DC

## 2015-03-31 NOTE — Progress Notes (Signed)
Pre visit review using our clinic review tool, if applicable. No additional management support is needed unless otherwise documented below in the visit note. 

## 2015-03-31 NOTE — Patient Instructions (Signed)
BEFORE YOU LEAVE: -check BP  -follow up in 1 month to recheck blood pressure and for follow up  Take the medications as instructed  Follow up as needed in interim if worsening or not improving on treatment

## 2015-03-31 NOTE — Progress Notes (Signed)
HPI: Resp infection: -started: 2 weeks ago -symptoms:nasal congestion, sore throat, cough and low grade fevers - now persistent cough, mild SOB and lower ribs hurt from coughing, white sputum -denies:fever, SOB, NVD, tooth pain -sick contacts/travel/risks: lots of folk sick at work with the same -had similar illness in 01/2015 with prolonged course and had xrays and CT scan ROS: See pertinent positives and negatives per HPI.  Past Medical History  Diagnosis Date  . HYPERTENSION 10/18/2007    Qualifier: Diagnosis of  By: Sherlynn Stalls, CMA, [place]    . Irritable bowel syndrome 10/18/2007    Qualifier: Diagnosis of  By: Sarajane Jews MD, Ishmael Holter   . GERD 10/18/2007    Qualifier: Diagnosis of  By: Sherlynn Stalls, CMA, [place]    . Insomnia 03/12/2014  . Hyperlipemia 03/12/2014  . ANXIETY 10/18/2007    Qualifier: Diagnosis of  By: Sarajane Jews MD, Ishmael Holter   . Chronic low back pain   . Recurrent knee pain     R knee hx meniscal tear 2002 with repair    Past Surgical History  Procedure Laterality Date  . Meniscus repair Right   . Breast surgery      fibroid  . Tonsillectomy      Family History  Problem Relation Age of Onset  . Cancer Mother     melanoma  . Hyperlipidemia Mother   . Cancer Father     liver  . Cancer Maternal Grandfather   . Heart disease Paternal Grandfather     Social History   Social History  . Marital Status: Widowed    Spouse Name: N/A  . Number of Children: N/A  . Years of Education: N/A   Social History Main Topics  . Smoking status: Never Smoker   . Smokeless tobacco: None  . Alcohol Use: None  . Drug Use: None  . Sexual Activity: Not Asked   Other Topics Concern  . None   Social History Narrative   Work or School: [employer]       Home Situation: lives alone      Spiritual Beliefs: none      Lifestyle: no regular exercise; poor diet           Current outpatient prescriptions:  .  albuterol (PROVENTIL HFA;VENTOLIN HFA) 108 (90 BASE) MCG/ACT  inhaler, Inhale 2 puffs into the lungs every 4 (four) hours as needed for wheezing or shortness of breath (cough, shortness of breath or wheezing.)., Disp: 1 Inhaler, Rfl: 1 .  Albuterol Sulfate (PROAIR RESPICLICK) 123XX123 (90 BASE) MCG/ACT AEPB, Inhale 2 puffs into the lungs every 4 (four) hours as needed., Disp: 1 each, Rfl: 2 .  atorvastatin (LIPITOR) 10 MG tablet, TAKE 1 TABLET BY MOUTH EVERY DAY, Disp: 30 tablet, Rfl: 2 .  citalopram (CELEXA) 20 MG tablet, TAKE 1 TABLET (20 MG TOTAL) BY MOUTH DAILY., Disp: 90 tablet, Rfl: 0 .  diphenhydramine-acetaminophen (TYLENOL PM) 25-500 MG TABS, Take 1 tablet by mouth at bedtime as needed (sleep/pain). , Disp: , Rfl:  .  losartan-hydrochlorothiazide (HYZAAR) 50-12.5 MG per tablet, Take 0.5 tablets by mouth daily., Disp: 45 tablet, Rfl: 3 .  omeprazole (PRILOSEC) 40 MG capsule, Take 40 mg by mouth daily., Disp: , Rfl:  .  doxycycline (VIBRA-TABS) 100 MG tablet, Take 1 tablet (100 mg total) by mouth 2 (two) times daily., Disp: 14 tablet, Rfl: 0 .  predniSONE (DELTASONE) 20 MG tablet, Take 2 tablets (40 mg total) by mouth daily with breakfast., Disp:  8 tablet, Rfl: 0  EXAM:  Filed Vitals:   03/31/15 1310  BP: 132/94  Pulse: 101  Temp: 98.1 F (36.7 C)    Body mass index is 33.75 kg/(m^2).  GENERAL: vitals reviewed and listed above, alert, oriented, appears well hydrated and in no acute distress  HEENT: atraumatic, conjunttiva clear, no obvious abnormalities on inspection of external nose and ears, normal appearance of ear canals and TMs, clear nasal congestion, mild post oropharyngeal erythema with PND, no tonsillar edema or exudate, no sinus TTP  NECK: no obvious masses on inspection  LUNGS: clear to auscultation bilaterally, no wheezes, rales or rhonchi, good air movement  CV: HRRR, no peripheral edema  MS: moves all extremities without noticeable abnormality  PSYCH: pleasant and cooperative, no obvious depression or anxiety  ASSESSMENT AND  PLAN:  Discussed the following assessment and plan:  Respiratory infection - Plan: doxycycline (VIBRA-TABS) 100 MG tablet, predniSONE (DELTASONE) 20 MG tablet  -given HPI and exam findings today, a serious infection or illness is unlikely. We discussed potential etiologies, with VURI being most likely with bronchits - advised CXR to exclude LRI but she opted against given imaging with last but - opted to cover with abx for both instead.. We discussed treatment side effects, likely course, antibiotic misuse, transmission, and signs of developing a serious illness. -of course, we advised to return or notify a doctor immediately if symptoms worsen or persist or new concerns arise.    Patient Instructions  BEFORE YOU LEAVE: -check BP  -follow up in 1 month to recheck blood pressure and for follow up  Take the medications as instructed  Follow up as needed in interim if worsening or not improving on treatment     KIM, HANNAH R.

## 2015-04-08 ENCOUNTER — Ambulatory Visit (INDEPENDENT_AMBULATORY_CARE_PROVIDER_SITE_OTHER): Payer: PRIVATE HEALTH INSURANCE

## 2015-04-08 ENCOUNTER — Ambulatory Visit (INDEPENDENT_AMBULATORY_CARE_PROVIDER_SITE_OTHER): Payer: PRIVATE HEALTH INSURANCE | Admitting: Physician Assistant

## 2015-04-08 ENCOUNTER — Telehealth: Payer: Self-pay

## 2015-04-08 VITALS — BP 128/80 | HR 78 | Temp 97.9°F | Resp 16 | Ht 66.0 in | Wt 202.0 lb

## 2015-04-08 DIAGNOSIS — M940 Chondrocostal junction syndrome [Tietze]: Secondary | ICD-10-CM | POA: Diagnosis not present

## 2015-04-08 DIAGNOSIS — R05 Cough: Secondary | ICD-10-CM

## 2015-04-08 DIAGNOSIS — R0781 Pleurodynia: Secondary | ICD-10-CM

## 2015-04-08 DIAGNOSIS — R059 Cough, unspecified: Secondary | ICD-10-CM

## 2015-04-08 MED ORDER — MELOXICAM 15 MG PO TABS: 15.0000 mg | ORAL_TABLET | Freq: Every day | ORAL | Status: DC

## 2015-04-08 MED ORDER — CYCLOBENZAPRINE HCL 5 MG PO TABS: 5.0000 mg | ORAL_TABLET | Freq: Every day | ORAL | Status: DC

## 2015-04-08 MED ORDER — HYDROCODONE-ACETAMINOPHEN 5-325 MG PO TABS: 1.0000 | ORAL_TABLET | Freq: Four times a day (QID) | ORAL | Status: DC | PRN

## 2015-04-08 NOTE — Progress Notes (Signed)
[facility] [address], [place] [address] [phone]  Date:  04/08/2015   Name:  Karen Hall   DOB:  03-01-1956   MRN:  [id number]  PCP:  [name]., DO    Chief Complaint: Back Pain   History of Present Illness:  This is a 60 y.o. female with PMH HLD, HTN, IBS, GERD, allergic rhinitis, anxiety, insomnia who is presenting with 2 weeks of left rib pain. Located lateral chest, in line of axilla. States she has had a cough x 8 weeks. Treated by PCP multiple times. Finally treated for pneumonia and treated with doxy. Past 5 days cough has been much better. Pain in left side still present, worsen with coughing. Last night she was getting ready for bed. She was bent over at the sink, when she stood up she felt a pop in her left side/back. She has been in much more pain since then. Unable to sleep last night. Took some tylenol and no help. Iced a few times and helped temporarily. Does have a hx of osteopenia. No fractures.   PCP: Dr. Maudie Mercury  Review of Systems:  Review of Systems See HPI  Patient Active Problem List   Diagnosis Date Noted  . Hyperlipemia 03/12/2014  . Insomnia 03/12/2014  . Anxiety state 10/18/2007  . Essential hypertension 10/18/2007  . Allergic rhinitis 10/18/2007  . GERD 10/18/2007  . IRRITABLE BOWEL SYNDROME 10/18/2007  . HEADACHE 10/18/2007    Prior to Admission medications   Medication Sig Start Date End Date Taking? Authorizing Provider  albuterol (PROVENTIL HFA;VENTOLIN HFA) 108 (90 BASE) MCG/ACT inhaler Inhale 2 puffs into the lungs every 4 (four) hours as needed for wheezing or shortness of breath (cough, shortness of breath or wheezing.). 02/17/15  Yes Shawnee Knapp, MD  Albuterol Sulfate (PROAIR RESPICLICK) 123XX123 (90 BASE) MCG/ACT AEPB Inhale 2 puffs into the lungs every 4 (four) hours as needed. 02/17/15  Yes Shawnee Knapp, MD  atorvastatin (LIPITOR) 10 MG tablet TAKE 1 TABLET BY MOUTH EVERY DAY 03/24/15  Yes [name], DO    citalopram (CELEXA) 20 MG tablet TAKE 1 TABLET (20 MG TOTAL) BY MOUTH DAILY. 03/03/15  Yes [name], DO  diphenhydramine-acetaminophen (TYLENOL PM) 25-500 MG TABS Take 1 tablet by mouth at bedtime as needed (sleep/pain).    Yes Historical Provider, MD  doxycycline (VIBRA-TABS) 100 MG tablet Take 1 tablet (100 mg total) by mouth 2 (two) times daily. 03/31/15  Yes [name], DO  losartan-hydrochlorothiazide (HYZAAR) 50-12.5 MG per tablet Take 0.5 tablets by mouth daily. 04/22/14  Yes [name], DO  omeprazole (PRILOSEC) 40 MG capsule Take 40 mg by mouth daily.   Yes Historical Provider, MD    Allergies  Allergen Reactions  . Aspartame And Phenylalanine Other (See Comments)    Headache    Past Surgical History  Procedure Laterality Date  . Meniscus repair Right   . Breast surgery      fibroid  . Tonsillectomy      Social History  Substance Use Topics  . Smoking status: Never Smoker   . Smokeless tobacco: Never Used  . Alcohol Use: No    Family History  Problem Relation Age of Onset  . Cancer Mother     melanoma  . Hyperlipidemia Mother   . Cancer Father     liver  . Cancer Maternal Grandfather   . Heart disease Paternal Grandfather     Medication list has  been reviewed and updated.  Physical Examination:  Physical Exam  Constitutional: She is oriented to person, place, and time. She appears well-developed and well-nourished. No distress.  HENT:  Head: Normocephalic and atraumatic.  Right Ear: Hearing normal.  Left Ear: Hearing normal.  Nose: Nose normal.  Eyes: Conjunctivae and lids are normal. Right eye exhibits no discharge. Left eye exhibits no discharge. No scleral icterus.  Cardiovascular: Normal rate, regular rhythm, normal heart sounds and normal pulses.   No murmur heard. Pulmonary/Chest: Effort normal and breath sounds normal. No respiratory distress. She has no decreased breath sounds. She has no wheezes. She has no rhonchi. She has no rales. She  exhibits tenderness (left lateral inferior ribs) and bony tenderness.    Musculoskeletal: Normal range of motion.       Cervical back: Normal.       Thoracic back: She exhibits tenderness (left paraspinal and tender along depicted line, going towards lateral inf ribs where maximal tenderness present). She exhibits normal range of motion, no bony tenderness and no swelling.       Back:  Neurological: She is alert and oriented to person, place, and time.  Skin: Skin is warm, dry and intact. No lesion and no rash noted.  Psychiatric: She has a normal mood and affect. Her speech is normal and behavior is normal. Thought content normal.    BP 128/80 mmHg  Pulse 78  Temp(Src) 97.9 F (36.6 C) (Oral)  Resp 16  Ht 5\' 6"  (1.676 m)  Wt 202 lb (91.627 kg)  BMI 32.62 kg/m2  SpO2 96%  IMPRESSION: No visible rib fracture.  Stable right base atelectasis with elevated right hemidiaphragm.  Assessment and Plan:  1. Costochondritis 2. Rib pain on left side 3. Cough  Radiograph negative. Suspect costochondritis d/t prolonged cough. Cough getting much better with doxy. Treat with mobic and flexeril. Norco as needed. Counseled on breathing exercises, rest, heat/ice, gentle massage and stretching. Return or follow up with PCP if needed. - meloxicam (MOBIC) 15 MG tablet; Take 1 tablet (15 mg total) by mouth daily.  Dispense: 30 tablet; Refill: 0 - cyclobenzaprine (FLEXERIL) 5 MG tablet; Take 1 tablet (5 mg total) by mouth at bedtime.  Dispense: 30 tablet; Refill: 0 - HYDROcodone-acetaminophen (NORCO) 5-325 MG tablet; Take 1 tablet by mouth every 6 (six) hours as needed.  Dispense: 15 tablet; Refill: 0 - DG Ribs Unilateral W/Chest Left; Future   Benjaman Pott. Drenda Freeze, MHS [facility]  04/08/2015

## 2015-04-08 NOTE — Patient Instructions (Addendum)
.  Because you received an x-ray today, you will receive an invoice from [facility]. Please contact [facility] at [phone] with questions or concerns regarding your invoice. Our billing staff will not be able to assist you with those questions.  meloxicam once a day. Don't any other anti-inflammatories with this. You can take tylenol if needed. Take flexeril at night - this is a muscle relaxer. It will help you sleep. Take hydrocodone if needed. Rest, ice, heat. Focus on deep breathing a few times a day to ensure good air flow.  Costochondritis Costochondritis, sometimes called Tietze syndrome, is a swelling and irritation (inflammation) of the tissue (cartilage) that connects your ribs with your breastbone (sternum). It causes pain in the chest and rib area. Costochondritis usually goes away on its own over time. It can take up to 6 weeks or longer to get better, especially if you are unable to limit your activities. CAUSES  Some cases of costochondritis have no known cause. Possible causes include:  Injury (trauma).  Exercise or activity such as lifting.  Severe coughing. SIGNS AND SYMPTOMS  Pain and tenderness in the chest and rib area.  Pain that gets worse when coughing or taking deep breaths.  Pain that gets worse with specific movements. DIAGNOSIS  Your health care provider will do a physical exam and ask about your symptoms. Chest X-rays or other tests may be done to rule out other problems. TREATMENT  Costochondritis usually goes away on its own over time. Your health care provider may prescribe medicine to help relieve pain. HOME CARE INSTRUCTIONS   Avoid exhausting physical activity. Try not to strain your ribs during normal activity. This would include any activities using chest, abdominal, and side muscles, especially if heavy weights are used.  Apply ice to the affected area for the first 2 days after the pain begins.  Put ice in a plastic  bag.  Place a towel between your skin and the bag.  Leave the ice on for 20 minutes, 2-3 times a day.  Only take over-the-counter or prescription medicines as directed by your health care provider. SEEK MEDICAL CARE IF:  You have redness or swelling at the rib joints. These are signs of infection.  Your pain does not go away despite rest or medicine. SEEK IMMEDIATE MEDICAL CARE IF:   Your pain increases or you are very uncomfortable.  You have shortness of breath or difficulty breathing.  You cough up blood.  You have worse chest pains, sweating, or vomiting.  You have a fever or persistent symptoms for more than 2-3 days.  You have a fever and your symptoms suddenly get worse. MAKE SURE YOU:   Understand these instructions.  Will watch your condition.  Will get help right away if you are not doing well or get worse.   This information is not intended to replace advice given to you by your health care provider. Make sure you discuss any questions you have with your health care provider.   Document Released: 12/09/2004 Document Revised: 12/20/2012 Document Reviewed: 10/03/2012 Elsevier Interactive Patient Education [employer].

## 2015-04-08 NOTE — Telephone Encounter (Signed)
Bush - Pt needs a note for work stating that it is fine for her to return to work.  Her number is [phone].  She would like [place] to fax it to her work at [phone], attention HR.

## 2015-04-09 NOTE — Telephone Encounter (Signed)
Ok to return? Karen Hall off today.

## 2015-04-09 NOTE — Telephone Encounter (Signed)
Wrote note and faxed.

## 2015-04-09 NOTE — Telephone Encounter (Signed)
Ok to return to work.  Please write note and fax as listed.

## 2015-04-10 ENCOUNTER — Telehealth: Payer: Self-pay

## 2015-04-10 NOTE — Telephone Encounter (Signed)
Patient left a voicemail stating she needs her return to work note revised. Its says she is cleared to RTW on 01/26 but she needs it changed to 01/24, the day she was seen. She states if it's not changed, she will lose pay. Cb# [phone].

## 2015-04-10 NOTE — Telephone Encounter (Signed)
Faxed note to employer. Pt notified.

## 2015-05-01 ENCOUNTER — Encounter: Payer: Self-pay | Admitting: Family Medicine

## 2015-05-01 ENCOUNTER — Ambulatory Visit (INDEPENDENT_AMBULATORY_CARE_PROVIDER_SITE_OTHER): Payer: BLUE CROSS/BLUE SHIELD | Admitting: Family Medicine

## 2015-05-01 VITALS — BP 122/82 | HR 105 | Temp 98.5°F | Ht 66.0 in | Wt 200.7 lb

## 2015-05-01 DIAGNOSIS — K219 Gastro-esophageal reflux disease without esophagitis: Secondary | ICD-10-CM

## 2015-05-01 DIAGNOSIS — R05 Cough: Secondary | ICD-10-CM

## 2015-05-01 DIAGNOSIS — E785 Hyperlipidemia, unspecified: Secondary | ICD-10-CM

## 2015-05-01 DIAGNOSIS — R053 Chronic cough: Secondary | ICD-10-CM

## 2015-05-01 DIAGNOSIS — I10 Essential (primary) hypertension: Secondary | ICD-10-CM | POA: Diagnosis not present

## 2015-05-01 DIAGNOSIS — Z23 Encounter for immunization: Secondary | ICD-10-CM

## 2015-05-01 DIAGNOSIS — E119 Type 2 diabetes mellitus without complications: Secondary | ICD-10-CM

## 2015-05-01 LAB — LIPID PANEL
CHOL/HDL RATIO: 4
CHOLESTEROL: 156 mg/dL (ref 0–200)
HDL: 42.7 mg/dL (ref >39.00)
LDL Cholesterol: 74 mg/dL (ref 0–99)
NonHDL: 113.17
TRIGLYCERIDES: 196 mg/dL — AB (ref 0.0–149.0)
VLDL: 39.2 mg/dL (ref 0.0–40.0)

## 2015-05-01 LAB — HEMOGLOBIN A1C: HEMOGLOBIN A1C: 6.7 % — AB (ref 4.6–6.5)

## 2015-05-01 NOTE — Progress Notes (Signed)
Pre visit review using our clinic review tool, if applicable. No additional management support is needed unless otherwise documented below in the visit note. 

## 2015-05-01 NOTE — Addendum Note (Signed)
Addended by: Agnes Lawrence on: 05/01/2015 08:44 AM   Modules accepted: Orders

## 2015-05-01 NOTE — Patient Instructions (Signed)
Before you leave: -Pneumococcal 23 vaccine -Labs -Schedule follow up in 3-4 months  Afrin nasal spray twice daily for 3 days then stop. Flonase 2 sprays each nostril daily for 21 days, then 1 spray each nostril thereafter. Call in 1 month if your cough persists and we will plan to have you see one of our pulmonologist.  -We have ordered labs or studies at this visit. It can take up to 1-2 weeks for results and processing. We will contact you with instructions IF your results are abnormal. Normal results will be released to your [facility]. If you have not heard from [place] or can not find your results in [facility] in 2 weeks please contact our office.  We recommend the following healthy lifestyle measures: - eat a healthy whole foods diet consisting of regular small meals composed of vegetables, fruits, beans, nuts, seeds, healthy meats such as white chicken and fish and whole grains.  - avoid sweets, white starchy foods, fried foods, fast food, processed foods, sodas, red meet and other fattening foods.  - get a least 150-300 minutes of aerobic exercise per week.

## 2015-05-01 NOTE — Progress Notes (Signed)
HPI:  Follow up:  Chronic Cough: -Reports has had a cough for many years -Reports extensive evaluation in the past with allergy testing several times, ear nose and throat evaluation and various other workups including PFTs per her report -has GERD which she feels is well controlled on high dose PPI - terrible if decreases or stops her PPI -Chronic postnasal drip - started Flonase a few days ago  HTN: -meds:losartan-hctz 50-12 -Stable -Denies chest pain, headaches, swelling  HLD/Obesity: -meds: lipitor 10mg  -Stable -Plans to start Weight Watchers next week and is working on decreasing in eliminating carbohydrates  GAD: -celexa 20mg  -stable  Mild Diabetes: -diet controlled -reports: Working on decreasing carbs in starting [employer] Watchers -denies:Lesions on feet, change in vision  ROS: See pertinent positives and negatives per HPI.  Past Medical History  Diagnosis Date  . HYPERTENSION 10/18/2007    Qualifier: Diagnosis of  By: Sherlynn Stalls, CMA, [place]    . Irritable bowel syndrome 10/18/2007    Qualifier: Diagnosis of  By: Sarajane Jews MD, Ishmael Holter   . GERD 10/18/2007    Qualifier: Diagnosis of  By: Sherlynn Stalls, CMA, [place]    . Insomnia 03/12/2014  . Hyperlipemia 03/12/2014  . ANXIETY 10/18/2007    Qualifier: Diagnosis of  By: Sarajane Jews MD, Ishmael Holter   . Chronic low back pain   . Recurrent knee pain     R knee hx meniscal tear 2002 with repair    Past Surgical History  Procedure Laterality Date  . Meniscus repair Right   . Breast surgery      fibroid  . Tonsillectomy      Family History  Problem Relation Age of Onset  . Cancer Mother     melanoma  . Hyperlipidemia Mother   . Cancer Father     liver  . Cancer Maternal Grandfather   . Heart disease Paternal Grandfather     Social History   Social History  . Marital Status: Widowed    Spouse Name: N/A  . Number of Children: N/A  . Years of Education: N/A   Social History Main Topics  . Smoking status: Never Smoker   .  Smokeless tobacco: Never Used  . Alcohol Use: No  . Drug Use: No  . Sexual Activity: Not Asked   Other Topics Concern  . None   Social History Narrative   Work or School: [employer]       Home Situation: lives alone      Spiritual Beliefs: none      Lifestyle: no regular exercise; poor diet           Current outpatient prescriptions:  .  albuterol (PROVENTIL HFA;VENTOLIN HFA) 108 (90 BASE) MCG/ACT inhaler, Inhale 2 puffs into the lungs every 4 (four) hours as needed for wheezing or shortness of breath (cough, shortness of breath or wheezing.)., Disp: 1 Inhaler, Rfl: 1 .  Albuterol Sulfate (PROAIR RESPICLICK) 123XX123 (90 BASE) MCG/ACT AEPB, Inhale 2 puffs into the lungs every 4 (four) hours as needed., Disp: 1 each, Rfl: 2 .  atorvastatin (LIPITOR) 10 MG tablet, TAKE 1 TABLET BY MOUTH EVERY DAY, Disp: 30 tablet, Rfl: 2 .  citalopram (CELEXA) 20 MG tablet, TAKE 1 TABLET (20 MG TOTAL) BY MOUTH DAILY., Disp: 90 tablet, Rfl: 0 .  cyclobenzaprine (FLEXERIL) 5 MG tablet, Take 1 tablet (5 mg total) by mouth at bedtime., Disp: 30 tablet, Rfl: 0 .  diphenhydramine-acetaminophen (TYLENOL PM) 25-500 MG TABS, Take 1 tablet by mouth  at bedtime as needed (sleep/pain). , Disp: , Rfl:  .  fluticasone (FLONASE) 50 MCG/ACT nasal spray, Place into both nostrils as needed for allergies or rhinitis., Disp: , Rfl:  .  HYDROcodone-acetaminophen (NORCO) 5-325 MG tablet, Take 1 tablet by mouth every 6 (six) hours as needed., Disp: 15 tablet, Rfl: 0 .  losartan-hydrochlorothiazide (HYZAAR) 50-12.5 MG per tablet, Take 0.5 tablets by mouth daily., Disp: 45 tablet, Rfl: 3 .  meloxicam (MOBIC) 15 MG tablet, Take 1 tablet (15 mg total) by mouth daily., Disp: 30 tablet, Rfl: 0 .  omeprazole (PRILOSEC) 40 MG capsule, Take 40 mg by mouth daily., Disp: , Rfl:  .  Probiotic Product (PROBIOTIC DAILY PO), Take by mouth., Disp: , Rfl:   EXAM:  Filed Vitals:   05/01/15 0810  BP: 122/82  Pulse: 105   Temp: 98.5 F (36.9 C)    Body mass index is 32.41 kg/(m^2).  GENERAL: vitals reviewed and listed above, alert, oriented, appears well hydrated and in no acute distress  HEENT: atraumatic, conjunttiva clear, no obvious abnormalities on inspection of external nose and ears, normal appearance of ear canals and TMs, clear nasal congestion, mild post oropharyngeal erythema with PND, no tonsillar edema or exudate, no sinus TTP  NECK: no obvious masses on inspection  LUNGS: clear to auscultation bilaterally, no wheezes, rales or rhonchi, good air movement  CV: HRRR, no peripheral edema  MS: moves all extremities without noticeable abnormality  PSYCH: pleasant and cooperative, no obvious depression or anxiety  ASSESSMENT AND PLAN:  Discussed the following assessment and plan:  Type 2 diabetes mellitus without complication, without long-term current use of insulin (HCC) - Plan: Hemoglobin A1c Foot exam today Lifestyle recs Advised diabetic eye exam annually  Essential hypertension Stable -continue current treatment  Hyperlipemia - Plan: Lipid Panel  Gastroesophageal reflux disease, esophagitis presence not specified  Chronic cough -trial INS, she is to call in 1 month and will have her see pulm if persists  -Patient advised to return or notify a doctor immediately if symptoms worsen or persist or new concerns arise.  Patient Instructions  Before you leave: -Pneumococcal 23 vaccine -Labs -Schedule follow up in 3-4 months  Afrin nasal spray twice daily for 3 days then stop. Flonase 2 sprays each nostril daily for 21 days, then 1 spray each nostril thereafter. Call in 1 month if your cough persists and we will plan to have you see one of our pulmonologist.  -We have ordered labs or studies at this visit. It can take up to 1-2 weeks for results and processing. We will contact you with instructions IF your results are abnormal. Normal results will be released to your [facility].  If you have not heard from [place] or can not find your results in [facility] in 2 weeks please contact our office.  We recommend the following healthy lifestyle measures: - eat a healthy whole foods diet consisting of regular small meals composed of vegetables, fruits, beans, nuts, seeds, healthy meats such as white chicken and fish and whole grains.  - avoid sweets, white starchy foods, fried foods, fast food, processed foods, sodas, red meet and other fattening foods.  - get a least 150-300 minutes of aerobic exercise per week.           Colin Benton R.

## 2015-05-06 ENCOUNTER — Other Ambulatory Visit: Payer: Self-pay | Admitting: Family Medicine

## 2015-05-06 MED ORDER — LOSARTAN POTASSIUM-HCTZ 50-12.5 MG PO TABS: 0.5000 | ORAL_TABLET | Freq: Every day | ORAL | Status: DC

## 2015-05-06 MED ORDER — ATORVASTATIN CALCIUM 10 MG PO TABS: 10.0000 mg | ORAL_TABLET | Freq: Every day | ORAL | Status: DC

## 2015-05-21 ENCOUNTER — Other Ambulatory Visit: Payer: Self-pay | Admitting: Family Medicine

## 2015-07-02 ENCOUNTER — Telehealth: Payer: Self-pay | Admitting: Family Medicine

## 2015-07-02 NOTE — Telephone Encounter (Signed)
[facility] - Client [place] [facility]  Patient Name: Karen Hall  DOB: [birth year]    Initial Comment Caller States bad cough, fever around 100 for last 2 days   Nurse Assessment  Nurse: Wayne Sever, RN, Tillie Rung Date/Time (Eastern Time): 07/02/2015 1:53:19 PM  Confirm and document reason for call. If symptomatic, describe symptoms. You must click the next button to save text entered. ---Caller states she has been sick for 2 days. She has fever of around 100, but has been up to 101. She states she had the cough and congestion forever and she cannot get rid of it. Caller states she is coughing up clear mucous  Has the patient traveled out of the country within the last 30 days? ---No  Does the patient have any new or worsening symptoms? ---Yes  Will a triage be completed? ---Yes  Related visit to physician within the last 2 weeks? ---No  Does the PT have any chronic conditions? (i.e. diabetes, asthma, etc.) ---Yes  List chronic conditions. ---Gallstones  Is this a behavioral health or substance abuse call? ---No     Guidelines    Guideline Title Affirmed Question Affirmed Notes  Cough - Acute Productive SEVERE coughing spells (e.g., whooping sound after coughing, vomiting after coughing)    Final Disposition User   See Physician within [place], RN, Tillie Rung    Comments  Scheduled with Dr. Maudie Mercury on 04/20 at 0945am   Referrals  REFERRED TO PCP OFFICE   Disagree/Comply: Comply

## 2015-07-03 ENCOUNTER — Encounter: Payer: Self-pay | Admitting: Family Medicine

## 2015-07-03 ENCOUNTER — Ambulatory Visit (INDEPENDENT_AMBULATORY_CARE_PROVIDER_SITE_OTHER): Payer: BLUE CROSS/BLUE SHIELD | Admitting: Family Medicine

## 2015-07-03 VITALS — BP 100/80 | HR 124 | Temp 99.8°F | Ht 66.0 in | Wt 189.7 lb

## 2015-07-03 DIAGNOSIS — R05 Cough: Secondary | ICD-10-CM | POA: Diagnosis not present

## 2015-07-03 DIAGNOSIS — R059 Cough, unspecified: Secondary | ICD-10-CM

## 2015-07-03 DIAGNOSIS — J069 Acute upper respiratory infection, unspecified: Secondary | ICD-10-CM | POA: Diagnosis not present

## 2015-07-03 NOTE — Progress Notes (Signed)
HPI:  Cough, congestion: -started:3-4 days ago -symptoms:nasal congestion, fever - high 101 yesterday, sore throat, cough, sneezing, occ wheezing, nausea -denies SOB, vomiting or diarrhea, tooth pain -sick contacts/travel/risks: no reported flu, strep or tick exposure -Hx of: allergies and chronic cough, gerd - she reports she has tried everything for her chronic cough that is ongoing for several years. She has tried acid reducers, intranasal steroids, steroids, antibiotics, every cough medication - reports "none of them work" -She has seen several specialists per her report and says that she was told she does not have allergies and does not have asthma. She had a CT angiogram scan in 2017 for this. She reports she has had pulmonary function testing that was negative. She wants to see a specialist again. She is very frustrated. ROS: See pertinent positives and negatives per HPI.  Past Medical History  Diagnosis Date  . HYPERTENSION 10/18/2007    Qualifier: Diagnosis of  By: Sherlynn Stalls, CMA, [place]    . Irritable bowel syndrome 10/18/2007    Qualifier: Diagnosis of  By: Sarajane Jews MD, Ishmael Holter   . GERD 10/18/2007    Qualifier: Diagnosis of  By: Sherlynn Stalls, CMA, [place]    . Insomnia 03/12/2014  . Hyperlipemia 03/12/2014  . ANXIETY 10/18/2007    Qualifier: Diagnosis of  By: Sarajane Jews MD, Ishmael Holter   . Chronic low back pain   . Recurrent knee pain     R knee hx meniscal tear 2002 with repair    Past Surgical History  Procedure Laterality Date  . Meniscus repair Right   . Breast surgery      fibroid  . Tonsillectomy      Family History  Problem Relation Age of Onset  . Cancer Mother     melanoma  . Hyperlipidemia Mother   . Cancer Father     liver  . Cancer Maternal Grandfather   . Heart disease Paternal Grandfather     Social History   Social History  . Marital Status: Widowed    Spouse Name: N/A  . Number of Children: N/A  . Years of Education: N/A   Social History Main Topics  . Smoking  status: Never Smoker   . Smokeless tobacco: Never Used  . Alcohol Use: No  . Drug Use: No  . Sexual Activity: Not Asked   Other Topics Concern  . None   Social History Narrative   Work or School: Press photographer - [employer]       Home Situation: lives alone      Spiritual Beliefs: none      Lifestyle: no regular exercise; poor diet           Current outpatient prescriptions:  .  atorvastatin (LIPITOR) 10 MG tablet, Take 1 tablet (10 mg total) by mouth daily., Disp: 90 tablet, Rfl: 0 .  citalopram (CELEXA) 20 MG tablet, TAKE 1 TABLET (20 MG TOTAL) BY MOUTH DAILY., Disp: 90 tablet, Rfl: 0 .  cyclobenzaprine (FLEXERIL) 5 MG tablet, Take 1 tablet (5 mg total) by mouth at bedtime., Disp: 30 tablet, Rfl: 0 .  diphenhydramine-acetaminophen (TYLENOL PM) 25-500 MG TABS, Take 1 tablet by mouth at bedtime as needed (sleep/pain). , Disp: , Rfl:  .  fluticasone (FLONASE) 50 MCG/ACT nasal spray, Place into both nostrils as needed for allergies or rhinitis., Disp: , Rfl:  .  HYDROcodone-acetaminophen (NORCO) 5-325 MG tablet, Take 1 tablet by mouth every 6 (six) hours as needed., Disp: 15 tablet, Rfl: 0 .  losartan-hydrochlorothiazide (HYZAAR) 50-12.5 MG tablet, Take 0.5 tablets by mouth daily., Disp: 90 tablet, Rfl: 0 .  losartan-hydrochlorothiazide (HYZAAR) 50-12.5 MG tablet, TAKE 0.5 TABLETS BY MOUTH DAILY., Disp: 45 tablet, Rfl: 1 .  meloxicam (MOBIC) 15 MG tablet, Take 1 tablet (15 mg total) by mouth daily., Disp: 30 tablet, Rfl: 0 .  omeprazole (PRILOSEC) 40 MG capsule, Take 40 mg by mouth daily., Disp: , Rfl:  .  Probiotic Product (PROBIOTIC DAILY PO), Take by mouth., Disp: , Rfl:   EXAM:  Filed Vitals:   07/03/15 0952  BP: 100/80  Pulse: 124  Temp: 99.8 F (37.7 C)    Body mass index is 30.63 kg/(m^2).  GENERAL: vitals reviewed and listed above, alert, oriented, appears well hydrated and in no acute distress  HEENT: atraumatic, conjunttiva clear, no obvious abnormalities  on inspection of external nose and ears, normal appearance of ear canals and TMs, clear nasal congestion, mild post oropharyngeal erythema with PND, no tonsillar edema or exudate, no sinus TTP  NECK: no obvious masses on inspection  LUNGS: clear to auscultation bilaterally, no wheezes, rales or rhonchi, good air movement  CV: HRRR, no peripheral edema  MS: moves all extremities without noticeable abnormality  PSYCH: pleasant and cooperative, no obvious depression or anxiety  ASSESSMENT AND PLAN:  Discussed the following assessment and plan:  Cough - Plan: Ambulatory referral to Pulmonology, DG Chest 2 View  Acute upper respiratory infection  -She has an acute illness overlying a chronic cough -We talked about the many potential common causes of chronic cough-Seems she has tried many treatments and has seen several specialists for this in the past. She is frustrated and a referral to pulmonology was placed. -given HPI and exam findings today, a serious infection or illness is unlikely the cause of her acute illness. Possible mild flu. Out of treatment window for likely benefit from Tamiflu.  We discussed potential etiologies, with VURI being most likely, and advised supportive care and monitoring. We discussed treatment side effects, likely course, antibiotic misuse, transmission, and signs of developing a serious illness. We also will obtain a chest x-ray to exclude lower respiratory infection. -of course, we advised to return or notify a doctor immediately if symptoms worsen or persist or new concerns arise.    There are no Patient Instructions on file for this visit.   Colin Benton R.

## 2015-07-03 NOTE — Patient Instructions (Signed)
Before you leave: -X-ray sheet  Go get the x-ray.  Afrin nasal spray twice daily for 5 days. Then stop. This is available over-the-counter.  Take your acid medication and Flonase daily.  Try the albuterol for coughing fit if this helps.  Tylenol per instructions as needed.  We placed a referral for you as discussed to the pulmonologist for the chronic cough. It usually takes about 1-2 weeks to process and schedule this referral. If you have not heard from [place] regarding this appointment in 2 weeks please contact our office.   Upper Respiratory Infection, Adult An upper respiratory infection (URI) is also known as the common cold. It is often caused by a type of germ (virus). Colds are easily spread (contagious). You can pass it to others by kissing, coughing, sneezing, or drinking out of the same glass. Usually, you get better in 1 to 3  weeks.  However, the cough can last for even longer. HOME CARE   Only take medicine as told by your doctor. Follow instructions provided above.  Drink enough water and fluids to keep your pee (urine) clear or pale yellow.  Get plenty of rest.  Return to work when your temperature is < 100 for 24 hours or as told by your doctor. You may use a face mask and wash your hands to stop your cold from spreading. GET HELP RIGHT AWAY IF:   After the first few days, you feel you are getting worse.  You have questions about your medicine.  You have chills, shortness of breath, or red spit (mucus).  You have pain in the face for more then 1-2 days, especially when you bend forward.  You have a fever, puffy (swollen) neck, pain when you swallow, or white spots in the back of your throat.  You have a bad headache, ear pain, sinus pain, or chest pain.  You have a high-pitched whistling sound when you breathe in and out (wheezing).  You cough up blood.  You have sore muscles or a stiff neck. MAKE SURE YOU:   Understand these instructions.  Will watch  your condition.  Will get help right away if you are not doing well or get worse. Document Released: 08/18/2007 Document Revised: 05/24/2011 Document Reviewed: 06/06/2013 [facility] Patient Information 2015 [place], [place]. This information is not intended to replace advice given to you by your health care provider. Make sure you discuss any questions you have with your health care provider.

## 2015-07-03 NOTE — Telephone Encounter (Signed)
Patient was seen today by Dr.Kim.

## 2015-07-03 NOTE — Telephone Encounter (Signed)
Appt scheduled for today.

## 2015-07-03 NOTE — Progress Notes (Signed)
Pre visit review using our clinic review tool, if applicable. No additional management support is needed unless otherwise documented below in the visit note. 

## 2015-07-04 ENCOUNTER — Ambulatory Visit (INDEPENDENT_AMBULATORY_CARE_PROVIDER_SITE_OTHER)
Admission: RE | Admit: 2015-07-04 | Discharge: 2015-07-04 | Disposition: A | Discharge status: Home / Self Care | Payer: BLUE CROSS/BLUE SHIELD | Source: Ambulatory Visit | Attending: Family Medicine | Admitting: Family Medicine

## 2015-07-04 ENCOUNTER — Other Ambulatory Visit: Payer: Self-pay | Admitting: *Deleted

## 2015-07-04 ENCOUNTER — Telehealth: Payer: Self-pay | Admitting: Family Medicine

## 2015-07-04 DIAGNOSIS — R05 Cough: Secondary | ICD-10-CM | POA: Diagnosis not present

## 2015-07-04 DIAGNOSIS — R059 Cough, unspecified: Secondary | ICD-10-CM

## 2015-07-04 MED ORDER — LEVOFLOXACIN 750 MG PO TABS: 750.0000 mg | ORAL_TABLET | Freq: Every day | ORAL | Status: DC

## 2015-07-04 NOTE — Telephone Encounter (Signed)
Patient informed of the x-ray results and she is aware the Rx was sent to her pharmacy.

## 2015-07-04 NOTE — Telephone Encounter (Signed)
Pt call to say she need a note that say she can go back to work on Monday 07/07/15

## 2015-07-07 NOTE — Telephone Encounter (Signed)
Pt need to know when can she go back to work.

## 2015-07-09 ENCOUNTER — Encounter: Payer: Self-pay | Admitting: *Deleted

## 2015-07-09 ENCOUNTER — Ambulatory Visit (INDEPENDENT_AMBULATORY_CARE_PROVIDER_SITE_OTHER): Payer: BLUE CROSS/BLUE SHIELD | Admitting: Pulmonary Disease

## 2015-07-09 ENCOUNTER — Encounter: Payer: Self-pay | Admitting: Pulmonary Disease

## 2015-07-09 VITALS — BP 136/68 | HR 126 | Ht 66.0 in | Wt 188.0 lb

## 2015-07-09 DIAGNOSIS — K219 Gastro-esophageal reflux disease without esophagitis: Secondary | ICD-10-CM | POA: Diagnosis not present

## 2015-07-09 DIAGNOSIS — J3089 Other allergic rhinitis: Secondary | ICD-10-CM | POA: Diagnosis not present

## 2015-07-09 DIAGNOSIS — J209 Acute bronchitis, unspecified: Secondary | ICD-10-CM | POA: Insufficient documentation

## 2015-07-09 MED ORDER — PREDNISONE 20 MG PO TABS: 60.0000 mg | ORAL_TABLET | Freq: Every day | ORAL | Status: DC

## 2015-07-09 MED ORDER — BENZONATATE 100 MG PO CAPS: 100.0000 mg | ORAL_CAPSULE | Freq: Three times a day (TID) | ORAL | Status: DC | PRN

## 2015-07-09 NOTE — Addendum Note (Signed)
Addended by: Len Blalock on: 07/09/2015 02:31 PM   Modules accepted: Orders

## 2015-07-09 NOTE — Telephone Encounter (Signed)
Will route message to Dr. Maudie Mercury as she is out of office today. Do not see any documentation stating patient is to take time out of work.

## 2015-07-09 NOTE — Progress Notes (Signed)
Subjective:    Patient ID: Karen Hall, female    DOB: Sep 30, 1955, 60 y.o.   MRN: [id number]  HPI She reports starting last Tuesday she woke up a fever to 101F and a cough. She reports she has had increased sinus congestion that has resolved. She reports she has had a similar cough in November 2016 while living in [place]. She reports she was tested for [facility] Fever. She reports she was treated with nebulizer therapy without any improvement in symptoms. She reports she was treated with steroids and a couple of rounds of antibiotics. She reports she has continued to have a cough 2-3 days a week. She reports she has also developed dyspnea with wheezing during this episode as well. She reports she does sometime have post-tussive emesis. She reports she rarely produces a clear to white mucus. She is now completing a 7 day course of Levaquin without significant improvement in her symptoms. No hot or cold chills. No further fever. No chest pain or tightness. No recent sick contacts. No recent travel. She reports her sinus congestion has resolved. Denies any post-nasal drainage. She reports she coughs more when talking on the phone. She reports a "tickle" in her throat that seems to trigger her cough. Exertion also triggers her cough. She reports she has been on an albuterol inhaler without significant help.   Review of Systems No rashes or bruising. She reports she has had reflux chronically but doesn't have any morning brash water taste or symptoms as long as she is taking her Omeprazole daily. A pertinent 14 point review of systems is negative except as per the history of presenting illness.  Allergies  Allergen Reactions  . Aspartame And Phenylalanine Other (See Comments)    Headache    Current Outpatient Prescriptions on File Prior to Visit  Medication Sig Dispense Refill  . atorvastatin (LIPITOR) 10 MG tablet Take 1 tablet (10 mg total) by mouth daily. 90 tablet 0  . citalopram (CELEXA) 20 MG tablet  TAKE 1 TABLET (20 MG TOTAL) BY MOUTH DAILY. 90 tablet 0  . cyclobenzaprine (FLEXERIL) 5 MG tablet Take 1 tablet (5 mg total) by mouth at bedtime. 30 tablet 0  . diphenhydramine-acetaminophen (TYLENOL PM) 25-500 MG TABS Take 1 tablet by mouth at bedtime as needed (sleep/pain).     . fluticasone (FLONASE) 50 MCG/ACT nasal spray Place into both nostrils as needed for allergies or rhinitis.    [place] HYDROcodone-acetaminophen (NORCO) 5-325 MG tablet Take 1 tablet by mouth every 6 (six) hours as needed. 15 tablet 0  . levofloxacin (LEVAQUIN) 750 MG tablet Take 1 tablet (750 mg total) by mouth daily. 7 tablet 0  . losartan-hydrochlorothiazide (HYZAAR) 50-12.5 MG tablet Take 0.5 tablets by mouth daily. 90 tablet 0  . losartan-hydrochlorothiazide (HYZAAR) 50-12.5 MG tablet TAKE 0.5 TABLETS BY MOUTH DAILY. 45 tablet 1  . meloxicam (MOBIC) 15 MG tablet Take 1 tablet (15 mg total) by mouth daily. 30 tablet 0  . omeprazole (PRILOSEC) 40 MG capsule Take 40 mg by mouth daily.    . Probiotic Product (PROBIOTIC DAILY PO) Take by mouth.     No current facility-administered medications on file prior to visit.    Past Medical History  Diagnosis Date  . HYPERTENSION 10/18/2007    Qualifier: Diagnosis of  By: Sherlynn Stalls, CMA, [place]    . Irritable bowel syndrome 10/18/2007    Qualifier: Diagnosis of  By: Sarajane Jews MD, Ishmael Holter   . GERD 10/18/2007    Qualifier: Diagnosis of  BySherlynn Stalls, CMA, [place]    . Insomnia 03/12/2014  . Hyperlipemia 03/12/2014  . ANXIETY 10/18/2007    Qualifier: Diagnosis of  By: Sarajane Jews MD, Ishmael Holter   . Chronic low back pain   . Recurrent knee pain     R knee hx meniscal tear 2002 with repair    Past Surgical History  Procedure Laterality Date  . Meniscus repair Right   . Breast surgery      fibroid  . Tonsillectomy      Family History  Problem Relation Age of Onset  . Cancer Mother     melanoma  . Hyperlipidemia Mother   . Cancer Father     liver  . Cancer Maternal Grandfather   . Heart  disease Paternal Grandfather   . Lung disease Neg Hx     Social History   Social History  . Marital Status: Widowed    Spouse Name: N/A  . Number of Children: N/A  . Years of Education: N/A   Social History Main Topics  . Smoking status: Never Smoker   . Smokeless tobacco: Never Used  . Alcohol Use: No  . Drug Use: No  . Sexual Activity: Not Asked   Other Topics Concern  . None   Social History Narrative   Work or School: Press photographer - [employer]       Home Situation: lives alone      Spiritual Beliefs: none      Lifestyle: no regular exercise; poor diet      [facility] [facility]:   Originally from [place]. She has lived in [place], [place], [place], [place]. She has a dog currently. No bird exposure. No hot tub exposure. No mold exposure. Currently works as a Adult nurse.             Objective:   Physical Exam BP 136/68 mmHg  Pulse 126  Ht 5\' 6"  (1.676 m)  Wt 188 lb (85.276 kg)  BMI 30.36 kg/m2  SpO2 94% General:  Awake. Alert. No acute distress.   Integument:  Warm & dry. No rash on exposed skin. No bruising. Lymphatics:  No appreciated cervical or supraclavicular lymphadenoapthy. HEENT:  Moist mucus membranes. No oral ulcers. No scleral injection or icterus.  Cardiovascular:  Regular rate. No edema. No appreciable JVD.  Pulmonary:  Good aeration & clear to auscultation bilaterally. Symmetric chest wall expansion. No accessory muscle use. Frequent intermittent coughing. Abdomen: Soft. Normal bowel sounds. Nondistended. Grossly nontender. Musculoskeletal:  Normal bulk and tone. Hand grip strength 5/5 bilaterally. No joint deformity or effusion appreciated. Neurological:  CN 2-12 grossly in tact. No meningismus. Moving all 4 extremities equally. Symmetric brachioradialis deep tendon reflexes. Psychiatric:  Mood and affect congruent. Speech normal rhythm, rate & tone.   IMAGING CXR PA/LAT 07/04/15 (personally reviewed by me): Chronic elevation right  hemidiaphragm. Questionable atelectasis. No appreciated pleural effusion. No nodule or opacity. Heart normal in size. Mediastinum normal in contour.  CTA CHEST 02/20/15 (personally reviewed by me): No pleural effusion or thickening appreciated. No pathologic mediastinal adenopathy. Air bronchograms with right basilar atelectasis versus consolidation. No other parenchymal nodule appreciated. No pericardial effusion.    Assessment & Plan:  60 year old female with history of recurrent acute bronchitis. Also known history of allergic rhinitis. I clear if this patient may have underlying asthma predisposing her to these episodes of severe bronchitis and what seems to be a post viral cough. Her reflux seems to be well-controlled at this time and I feel  is not contributing. I am trying the patient on both inhaled steroids and bronchodilators as well as oral prednisone therapy. I instructed the patient contact my office if she had any new breathing problems before her next appointment.  1. Acute bronchitis: Likely viral in etiology. Cough suppression with Tessalon Perles. Patient given sample of Symbicort inhaler 80/4.5 to use 2 puffs twice daily and prednisone 60 mg by mouth daily for 6 days. Plan for full pulmonary function testing once patient recovers. 2. Allergic rhinitis: Asymptomatic. Continuing on Flonase. 3. GERD: Well-controlled on Prilosec. No changes. 4. Health maintenance: Patient received influenza vaccine September 2016, Pneumovax February 2017, & Tdap December 2015. 5. Follow-up: Patient to return to clinic in 1-2 weeks.  Sonia Baller Ashok Cordia, M.D. [facility] Pulmonary & Critical Care Pager:  [phone] After 3pm or if no response, call [phone] 2:27 PM 07/09/2015

## 2015-07-09 NOTE — Telephone Encounter (Signed)
Pt needs a note stating it is okay for her work from home due to her lung disease (job will not pay until they receive the note).  Pt would like a call back today.

## 2015-07-09 NOTE — Patient Instructions (Signed)
   I'm prescribing a prednisone and Tessalon Perles to help with your cough. Please take both as prescribed.  Use the Symbicort inhaler I have given you. Inhale 2 puffs twice a day until your cough is completely gone or inhaler runs out. Remember to rinse, gargle, and spit to prevent getting oral thrush.  Please call my office if you have any new breathing problems or questions before your next appointment.  We will see you back in 1-2 weeks to make sure you're getting better.

## 2015-07-09 NOTE — Telephone Encounter (Signed)
Letter made and upfront for patient to pick up. Let voicemail for patient to call office to make aware.

## 2015-07-09 NOTE — Telephone Encounter (Signed)
Sharyn Lull, could you speak with pt. I had told Wendie Simmer when she called pt last week that pt should be good to return to work when afebrile 24 hours - Monday likely unless still febrile rhen needed appt to re-eval if so. I had not advised any work restriction beyond that. It looks like from [place] notes pt was supposed to call Monday if not better (still febrile). I did not receive any notice until today. Can give note for return to work this past Monday (4/24) as this is what I advised. Thank you.

## 2015-07-10 NOTE — Telephone Encounter (Signed)
Patient is aware of letter upfront and states she actually found letter in her MyChart.

## 2015-07-14 ENCOUNTER — Other Ambulatory Visit: Payer: Self-pay | Admitting: Family Medicine

## 2015-07-21 ENCOUNTER — Encounter: Payer: Self-pay | Admitting: Pulmonary Disease

## 2015-07-21 ENCOUNTER — Ambulatory Visit (INDEPENDENT_AMBULATORY_CARE_PROVIDER_SITE_OTHER): Payer: BLUE CROSS/BLUE SHIELD | Admitting: Pulmonary Disease

## 2015-07-21 ENCOUNTER — Other Ambulatory Visit: Payer: BLUE CROSS/BLUE SHIELD

## 2015-07-21 VITALS — BP 106/82 | HR 91 | Ht 66.5 in | Wt 190.2 lb

## 2015-07-21 DIAGNOSIS — J42 Unspecified chronic bronchitis: Secondary | ICD-10-CM

## 2015-07-21 DIAGNOSIS — J302 Other seasonal allergic rhinitis: Secondary | ICD-10-CM

## 2015-07-21 DIAGNOSIS — K219 Gastro-esophageal reflux disease without esophagitis: Secondary | ICD-10-CM | POA: Diagnosis not present

## 2015-07-21 HISTORY — DX: Unspecified chronic bronchitis: J42

## 2015-07-21 NOTE — Patient Instructions (Addendum)
   Call me if your cough returns/worsens or you have any new breathing problems before your next appointment.  Continue taking all your medications as prescribed.  We will review your test results at her next appointment in 1-2 months.  TESTS ORDERED: 1. Full pulmonary function testing at follow-up appointment 2. Barium swallow 3. Maxillofacial CT scan without contrast 4. Quantitative immunoglobulin panel today

## 2015-07-21 NOTE — Progress Notes (Signed)
Subjective:    Patient ID: Karen Hall, female    DOB: [birth year], 60 y.o.   MRN: [id number]  C.C.:  Follow-up for Acute Bronchitis, Allergic Rhinitis, & GERD.  HPI Acute bronchitis: Patient given Rx for Prednisone for 6 days and Symbicort sample at last appointment. Continues to use her sample inhaler. Still has a mild cough productive of a "clear" mucus. She reports her cough is "90%" better. She has started walking too.   Allergic Rhinitis: On Flonase. She is doing Zycam as well. No sinus congestion or pressure.   GERD: On Prilosec. No reflux or dyspepsia. No morning brash water taste.   Review of Systems No chest pain or pressure. No fever, chills, or sweats.   Allergies  Allergen Reactions  . Aspartame And Phenylalanine Other (See Comments)    Headache    Current Outpatient Prescriptions on File Prior to Visit  Medication Sig Dispense Refill  . atorvastatin (LIPITOR) 10 MG tablet Take 1 tablet (10 mg total) by mouth daily. 90 tablet 0  . atorvastatin (LIPITOR) 10 MG tablet TAKE 1 TABLET BY MOUTH EVERY DAY 30 tablet 3  . citalopram (CELEXA) 20 MG tablet TAKE 1 TABLET (20 MG TOTAL) BY MOUTH DAILY. 90 tablet 0  . diphenhydramine-acetaminophen (TYLENOL PM) 25-500 MG TABS Take 1 tablet by mouth at bedtime as needed (sleep/pain).     . fluticasone (FLONASE) 50 MCG/ACT nasal spray Place into both nostrils as needed for allergies or rhinitis.    Marland Kitchen losartan-hydrochlorothiazide (HYZAAR) 50-12.5 MG tablet TAKE 0.5 TABLETS BY MOUTH DAILY. 45 tablet 1  . omeprazole (PRILOSEC) 40 MG capsule Take 40 mg by mouth daily.    . Probiotic Product (PROBIOTIC DAILY PO) Take by mouth daily.     . benzonatate (TESSALON) 100 MG capsule Take 1 capsule (100 mg total) by mouth 3 (three) times daily as needed for cough. (Patient not taking: Reported on 07/21/2015) 90 capsule 1   No current facility-administered medications on file prior to visit.    Past Medical History  Diagnosis Date  .  HYPERTENSION 10/18/2007    Qualifier: Diagnosis of  By: Sherlynn Stalls, CMA, [place]    . Irritable bowel syndrome 10/18/2007    Qualifier: Diagnosis of  By: Sarajane Jews MD, Ishmael Holter   . GERD 10/18/2007    Qualifier: Diagnosis of  By: Sherlynn Stalls, CMA, [place]    . Insomnia 03/12/2014  . Hyperlipemia 03/12/2014  . ANXIETY 10/18/2007    Qualifier: Diagnosis of  By: Sarajane Jews MD, Ishmael Holter   . Chronic low back pain   . Recurrent knee pain     R knee hx meniscal tear 2002 with repair    Past Surgical History  Procedure Laterality Date  . Meniscus repair Right   . Breast surgery      fibroid  . Tonsillectomy      Family History  Problem Relation Age of Onset  . Cancer Mother     melanoma  . Hyperlipidemia Mother   . Cancer Father     liver  . Cancer Maternal Grandfather   . Heart disease Paternal Grandfather   . Lung disease Neg Hx     Social History   Social History  . Marital Status: Widowed    Spouse Name: N/A  . Number of Children: N/A  . Years of Education: N/A   Social History Main Topics  . Smoking status: Never Smoker   . Smokeless tobacco: Never Used  . Alcohol Use: No  . Drug Use: No  .  Sexual Activity: Not Asked   Other Topics Concern  . None   Social History Narrative   Work or School: Press photographer - [employer]       Home Situation: lives alone      Spiritual Beliefs: none      Lifestyle: no regular exercise; poor diet      [place] [facility]:   Originally from [place]. She has lived in [place], [place], [place], [place]. She has a dog currently. No bird exposure. No hot tub exposure. No mold exposure. Currently works as a Adult nurse.             Objective:   Physical Exam BP 106/82 mmHg  Pulse 91  Ht 5' 6.5" (1.689 m)  Wt 190 lb 3.2 oz (86.274 kg)  BMI 30.24 kg/m2  SpO2 93% General:  Awake. Alert. No distress.   Integument:  Warm & dry. No rash on exposed skin.  Lymphatics:  No appreciated cervical or supraclavicular lymphadenoapthy. HEENT:  Moist mucus  membranes. No oral ulcers. No scleral injection. Minimal nasal turbinate swelling. Cardiovascular:  Regular rate. No edema. No appreciable JVD.  Pulmonary:   Clear bilaterally to auscultation. Normal work of breathing on room air. Speaking in complete sentences. Abdomen: Soft. Normal bowel sounds. Nontender.  IMAGING CXR PA/LAT 07/04/15 (previously reviewed by me): Chronic elevation right hemidiaphragm. Questionable atelectasis. No appreciated pleural effusion. No nodule or opacity. Heart normal in size. Mediastinum normal in contour.  CTA CHEST 02/20/15 (previously reviewed by me): No pleural effusion or thickening appreciated. No pathologic mediastinal adenopathy. Air bronchograms with right basilar atelectasis versus consolidation. No other parenchymal nodule appreciated. No pericardial effusion.    Assessment & Plan:  60 year old female with history of Chronic bronchitis occurring over the last year. Patient seems to be improving from her recent episode of acute bronchitis. Her allergic rhinitis & reflux seems to be well-controlled at this time as well. Given the frequent pulmonary infections that she is having I feel that evaluating her immune system as well as pulmonary function is necessary. I instructed the patient to contact my office if she had any new breathing problems or questions before next appointment and am releasing her back to work today.  1. Chronic Bronchitis: Checking full pulmonary function testing. Also checking quantitative immunoglobulin panel. 2. Allergic rhinitis: Asymptomatic on Flonase. Checking maxillofacial CT scan without contrast. 3. GERD: Asymptomatic on Prilosec. Checking barium swallow. 4. Health maintenance: Patient received influenza vaccine September 2016, Pneumovax February 2017, & Tdap December 2015. 5. Follow-up: Return to clinic in 1-2 months or sooner if needed.  Sonia Baller Ashok Cordia, M.D. [facility]monary & Critical [facility] Pager:  [phone] After 3pm  or if no response, call [phone] 10:09 AM 07/21/2015

## 2015-07-22 ENCOUNTER — Institutional Professional Consult (permissible substitution): Payer: BLUE CROSS/BLUE SHIELD | Admitting: Internal Medicine

## 2015-07-22 LAB — IGG, IGA, IGM
IgA: 70 mg/dL (ref 69–380)
IgG (Immunoglobin G), Serum: 1230 mg/dL (ref 690–1700)
IgM, Serum: 214 mg/dL (ref 52–322)

## 2015-07-25 ENCOUNTER — Ambulatory Visit (HOSPITAL_COMMUNITY)
Admission: RE | Admit: 2015-07-25 | Discharge: 2015-07-25 | Disposition: A | Discharge status: Home / Self Care | Payer: BLUE CROSS/BLUE SHIELD | Source: Ambulatory Visit | Attending: Pulmonary Disease | Admitting: Pulmonary Disease

## 2015-07-25 DIAGNOSIS — J302 Other seasonal allergic rhinitis: Secondary | ICD-10-CM | POA: Insufficient documentation

## 2015-07-25 DIAGNOSIS — K219 Gastro-esophageal reflux disease without esophagitis: Secondary | ICD-10-CM | POA: Diagnosis present

## 2015-07-29 ENCOUNTER — Other Ambulatory Visit: Payer: Self-pay | Admitting: Pulmonary Disease

## 2015-07-29 MED ORDER — RANITIDINE HCL 150 MG PO TABS: 150.0000 mg | ORAL_TABLET | Freq: Every day | ORAL | Status: DC

## 2015-07-29 MED ORDER — OMEPRAZOLE 40 MG PO CPDR: 40.0000 mg | DELAYED_RELEASE_CAPSULE | Freq: Every day | ORAL | Status: DC

## 2015-07-29 NOTE — Progress Notes (Signed)
Quick Note:  Called spoke with pt. Reviewed results and recs. Verified pharmacy as [facility] on [address]. She also states that she would like a refill on omeprazole due to the cost. Pt voiced understanding and had no further questions. Both prescriptions have been sent to the pharmacy. ______

## 2015-07-29 NOTE — Progress Notes (Signed)
Quick Note:  Called spoke with pt. Reviewed results and recs. Pt voiced understanding and had no further questions. ______ 

## 2015-07-30 ENCOUNTER — Ambulatory Visit (HOSPITAL_COMMUNITY)
Admission: RE | Admit: 2015-07-30 | Discharge: 2015-07-30 | Disposition: A | Discharge status: Home / Self Care | Payer: BLUE CROSS/BLUE SHIELD | Source: Ambulatory Visit | Attending: Pulmonary Disease | Admitting: Pulmonary Disease

## 2015-07-30 DIAGNOSIS — J42 Unspecified chronic bronchitis: Secondary | ICD-10-CM | POA: Insufficient documentation

## 2015-07-30 LAB — PULMONARY FUNCTION TEST
DL/VA % PRED: 63 %
DL/VA: 3.21 ml/min/mmHg/L
DLCO UNC % PRED: 50 %
DLCO unc: 13.72 ml/min/mmHg
FEF 25-75 PRE: 1.43 L/s
FEF 25-75 Post: 1.61 L/sec
FEF2575-%CHANGE-POST: 12 %
FEF2575-%PRED-POST: 67 %
FEF2575-%Pred-Pre: 60 %
FEV1-%CHANGE-POST: 3 %
FEV1-%Pred-Post: 71 %
FEV1-%Pred-Pre: 69 %
FEV1-POST: 1.94 L
FEV1-Pre: 1.87 L
FEV1FVC-%CHANGE-POST: 7 %
FEV1FVC-%Pred-Pre: 94 %
FEV6-%CHANGE-POST: -3 %
FEV6-%Pred-Post: 71 %
FEV6-%Pred-Pre: 74 %
FEV6-PRE: 2.5 L
FEV6-Post: 2.4 L
FEV6FVC-%Change-Post: 1 %
FEV6FVC-%PRED-PRE: 102 %
FEV6FVC-%Pred-Post: 104 %
FVC-%CHANGE-POST: -4 %
FVC-%PRED-POST: 69 %
FVC-%Pred-Pre: 72 %
FVC-Post: 2.44 L
FVC-Pre: 2.54 L
POST FEV1/FVC RATIO: 80 %
PRE FEV6/FVC RATIO: 99 %
Post FEV6/FVC ratio: 100 %
Pre FEV1/FVC ratio: 74 %
RV % PRED: 138 %
RV: 2.95 L
TLC % pred: 102 %
TLC: 5.46 L

## 2015-07-30 MED ORDER — ALBUTEROL SULFATE (2.5 MG/3ML) 0.083% IN NEBU
2.5000 mg | INHALATION_SOLUTION | Freq: Once | RESPIRATORY_TRACT | Status: AC
Start: 2015-07-30 — End: 2015-07-30
  Administered 2015-07-30: 2.5 mg via RESPIRATORY_TRACT

## 2015-08-21 ENCOUNTER — Encounter: Payer: Self-pay | Admitting: Gastroenterology

## 2015-08-21 ENCOUNTER — Ambulatory Visit (INDEPENDENT_AMBULATORY_CARE_PROVIDER_SITE_OTHER): Payer: BLUE CROSS/BLUE SHIELD | Admitting: Pulmonary Disease

## 2015-08-21 ENCOUNTER — Encounter: Payer: Self-pay | Admitting: Pulmonary Disease

## 2015-08-21 ENCOUNTER — Telehealth: Payer: Self-pay | Admitting: Pulmonary Disease

## 2015-08-21 VITALS — BP 102/68 | HR 96 | Ht 66.5 in | Wt 189.6 lb

## 2015-08-21 DIAGNOSIS — J309 Allergic rhinitis, unspecified: Secondary | ICD-10-CM | POA: Diagnosis not present

## 2015-08-21 DIAGNOSIS — R059 Cough, unspecified: Secondary | ICD-10-CM

## 2015-08-21 DIAGNOSIS — R05 Cough: Secondary | ICD-10-CM | POA: Diagnosis not present

## 2015-08-21 DIAGNOSIS — J410 Simple chronic bronchitis: Secondary | ICD-10-CM | POA: Diagnosis not present

## 2015-08-21 DIAGNOSIS — K219 Gastro-esophageal reflux disease without esophagitis: Secondary | ICD-10-CM

## 2015-08-21 MED ORDER — RANITIDINE HCL 300 MG PO TABS: 300.0000 mg | ORAL_TABLET | Freq: Every day | ORAL | Status: DC

## 2015-08-21 MED ORDER — PANTOPRAZOLE SODIUM 40 MG PO TBEC: 40.0000 mg | DELAYED_RELEASE_TABLET | Freq: Two times a day (BID) | ORAL | Status: DC

## 2015-08-21 NOTE — Telephone Encounter (Signed)
PFT 07/30/15: FVC 2.54 L (72%) FEV1 1.87 L (69%) FEV1/FVC 0.74 FEF 25-75 1.43 L (60%) no bronchodilator response TLC 5.46 L (102%) RV 138% ERV 52% DLCO uncorrected 50%  IMAGING BARIUM SWALLOW 07/25/15 (per radiologist): Full column gastroesophageal reflux. Asymmetric elevation right hemidiaphragm.  MAXILLOFACIAL CT W/O 07/25/15 (per radiologist): No significant paranasal sinus disease.  LABS 07/21/15 IgG: 1230 IgA: 70 IVF: 214

## 2015-08-21 NOTE — Addendum Note (Signed)
Addended by: Mathis Bud on: 08/21/2015 09:34 AM   Modules accepted: Orders, Medications

## 2015-08-21 NOTE — Progress Notes (Signed)
Subjective:    Patient ID: Karen Hall, female    DOB: 1955/12/08, 60 y.o.   MRN: [id number]  C.C.:  Follow-up for Chronic Bronchitis/Cough, Allergic Rhinitis, & GERD.  HPI Chronic Bronchitis/Cough: No evidence for immunoglobulin deficiency.  Cough remains intermittent. She is reporting more frequent coughing at night & first thing in the morning. She will wake up at night coughing. No antibiotics since last appointment. No relief from [employer]   Allergic Rhinitis: No evidence of sinus disease on maxillofacial CT scan. She reports the sensation of post-nasal drainage but no sinus congestion or pressure. Has even stopped Flonase.   GERD: Currently on Prilosec. Has full column reflux.  Compliant with Prilosec & Dyspepsia. She is sleeping with her head elevated. Denies any reflux or dyspepsia. No morning brash water taste.  Review of Systems No fever, chills, or sweats. No chest pain or pressure.   Allergies  Allergen Reactions  . Aspartame And Phenylalanine Other (See Comments)    Headache    Current Outpatient Prescriptions on File Prior to Visit  Medication Sig Dispense Refill  . atorvastatin (LIPITOR) 10 MG tablet TAKE 1 TABLET BY MOUTH EVERY DAY 30 tablet 3  . citalopram (CELEXA) 20 MG tablet TAKE 1 TABLET (20 MG TOTAL) BY MOUTH DAILY. 90 tablet 0  . diphenhydramine-acetaminophen (TYLENOL PM) 25-500 MG TABS Take 1 tablet by mouth at bedtime as needed (sleep/pain).     . fluticasone (FLONASE) 50 MCG/ACT nasal spray Place into both nostrils as needed for allergies or rhinitis.    Marland Kitchen losartan-hydrochlorothiazide (HYZAAR) 50-12.5 MG tablet TAKE 0.5 TABLETS BY MOUTH DAILY. 45 tablet 1  . omeprazole (PRILOSEC) 40 MG capsule Take 1 capsule (40 mg total) by mouth daily. 30 capsule 3  . ranitidine (ZANTAC) 150 MG tablet Take 1 tablet (150 mg total) by mouth at bedtime. 30 tablet 3   No current facility-administered medications on file prior to visit.    Past Medical History    Diagnosis Date  . HYPERTENSION 10/18/2007    Qualifier: Diagnosis of  By: Sherlynn Stalls, CMA, [place]    . Irritable bowel syndrome 10/18/2007    Qualifier: Diagnosis of  By: Sarajane Jews MD, Ishmael Holter   . GERD 10/18/2007    Qualifier: Diagnosis of  By: Sherlynn Stalls, CMA, [place]    . Insomnia 03/12/2014  . Hyperlipemia 03/12/2014  . ANXIETY 10/18/2007    Qualifier: Diagnosis of  By: Sarajane Jews MD, Ishmael Holter   . Chronic low back pain   . Recurrent knee pain     R knee hx meniscal tear 2002 with repair    Past Surgical History  Procedure Laterality Date  . Meniscus repair Right   . Breast surgery      fibroid  . Tonsillectomy      Family History  Problem Relation Age of Onset  . Cancer Mother     melanoma  . Hyperlipidemia Mother   . Cancer Father     liver  . Cancer Maternal Grandfather   . Heart disease Paternal Grandfather   . Lung disease Neg Hx     Social History   Social History  . Marital Status: Widowed    Spouse Name: N/A  . Number of Children: N/A  . Years of Education: N/A   Social History Main Topics  . Smoking status: Never Smoker   . Smokeless tobacco: Never Used  . Alcohol Use: No  . Drug Use: No  . Sexual Activity: Not Asked   Other Topics Concern  .  None   Social History Narrative   Work or School: Press photographer - [employer]       Home Situation: lives alone      Spiritual Beliefs: none      Lifestyle: no regular exercise; poor diet      [place] [facility]:   Originally from [place]. She has lived in [place], [place], [place], [place]. She has a dog currently. No bird exposure. No hot tub exposure. No mold exposure. Currently works as a Adult nurse.             Objective:   Physical Exam BP 102/68 mmHg  Pulse 96  Ht 5' 6.5" (1.689 m)  Wt 189 lb 9.6 oz (86.002 kg)  BMI 30.15 kg/m2  SpO2 95% General:  Awake. Alert. No distress.  Mild central obesity. Integument:  Warm & dry. No rash on exposed skin.  Lymphatics:  No appreciated cervical or  supraclavicular lymphadenoapthy. HEENT:  Moist mucus membranes. No oral ulcers. No scleral icterus. Cardiovascular:  Regular rate. No edema. Normal S1 & S2.  Pulmonary:   Clear on auscultation bilaterally. Normal work of breathing. Intermittent coughing noted. Abdomen: Soft. Normal bowel sounds. Nontender.  PFT 07/30/15: FVC 2.54 L (72%) FEV1 1.87 L (69%) FEV1/FVC 0.74 FEF 25-75 1.43 L (60%) no bronchodilator response TLC 5.46 L (102%) RV 138% ERV 52% DLCO uncorrected 50%  IMAGING BARIUM SWALLOW 07/25/15 (per radiologist): Full column gastroesophageal reflux. Asymmetric elevation right hemidiaphragm.  MAXILLOFACIAL CT W/O 07/25/15 (per radiologist): No significant paranasal sinus disease.  CXR PA/LAT 07/04/15 (previously reviewed by me): Chronic elevation right hemidiaphragm. Questionable atelectasis. No appreciated pleural effusion. No nodule or opacity. Heart normal in size. Mediastinum normal in contour.  CTA CHEST 02/20/15 (previously reviewed by me): No pleural effusion or thickening appreciated. No pathologic mediastinal adenopathy. Air bronchograms with right basilar atelectasis versus consolidation. No other parenchymal nodule appreciated. No pericardial effusion.  LABS 07/21/15 IgG: 1230 IgA: 70 IgM: 214    Assessment & Plan:  60 year old female with chronic bronchitis and ongoing cough likely secondary to uncontrolled reflux given barium swallow. Patient spirometry shows no evidence for airway obstruction with normal lung volumes. However, her carbon dioxide diffusion capacity is decreased. This is of unclear significance. I am increasing her gastric acid suppression in an effort to improve her cough control. Due to severity of her reflux I am referring her to GI specialist for further evaluation and possible upper endoscopy. I instructed the patient contact my office if she had any new breathing problems or questions before next appointment.  1. Chronic Bronchitis/Cough:  Likely  secondary to ongoing and recurrent reflux. Increasing gastric acid suppression. 2. GERD: Referring to [facility]. Increasing Zantac to 300 mg daily at bedtime. Switching from Prilosec to Protonix 40 mg by mouth twice a day. Again counseled patient on appropriate dietary and lifestyle modifications. 3. Allergic Rhinitis: Asymptomatic. No evidence of sinus disease on CT imaging. 4. Reduced DLCO: Repeat spirometry with DLCO next appointment. 5. Health maintenance: Patient received influenza vaccine September 2016, Pneumovax February 2017, & Tdap December 2015. 6. Follow-up: Return to clinic in 3 months or sooner if needed.  Sonia Baller Ashok Cordia, M.D. [facility] Pulmonary & Critical Care Pager:  [phone] After 3pm or if no response, call [phone] 9:25 AM 08/21/2015

## 2015-08-21 NOTE — Patient Instructions (Signed)
1. We are stopping and states this is your Prilosec and starting Protonix twice daily. Remember to take this 30 minutes before a meal on an empty stomach.  We are increasing your Zantac to 300 mg at night before bed.  Please try to avoid tomato-based products  I am referring you to a GI specialist to address your reflux  I will see you back in 3 months or sooner if needed  Please call my office if you have any new breathing problems before your next appointment or questions    TESTS ORDERED: spirometry with DLCO next appointment

## 2015-09-09 ENCOUNTER — Other Ambulatory Visit: Payer: Self-pay | Admitting: Family Medicine

## 2015-09-09 LAB — HM DIABETES EYE EXAM

## 2015-10-20 ENCOUNTER — Ambulatory Visit: Payer: BLUE CROSS/BLUE SHIELD | Admitting: Gastroenterology

## 2015-11-07 ENCOUNTER — Other Ambulatory Visit: Payer: Self-pay | Admitting: Family Medicine

## 2015-11-08 ENCOUNTER — Other Ambulatory Visit: Payer: Self-pay | Admitting: Family Medicine

## 2015-11-21 ENCOUNTER — Ambulatory Visit: Payer: BLUE CROSS/BLUE SHIELD | Admitting: Pulmonary Disease

## 2015-12-08 ENCOUNTER — Other Ambulatory Visit: Payer: Self-pay | Admitting: Pulmonary Disease

## 2015-12-08 DIAGNOSIS — K219 Gastro-esophageal reflux disease without esophagitis: Secondary | ICD-10-CM

## 2015-12-11 ENCOUNTER — Other Ambulatory Visit: Payer: Self-pay | Admitting: Family Medicine

## 2015-12-16 ENCOUNTER — Other Ambulatory Visit: Payer: Self-pay | Admitting: Pulmonary Disease

## 2015-12-16 DIAGNOSIS — K219 Gastro-esophageal reflux disease without esophagitis: Secondary | ICD-10-CM

## 2015-12-17 ENCOUNTER — Ambulatory Visit (INDEPENDENT_AMBULATORY_CARE_PROVIDER_SITE_OTHER): Payer: BLUE CROSS/BLUE SHIELD | Admitting: Pulmonary Disease

## 2015-12-17 ENCOUNTER — Other Ambulatory Visit: Payer: BLUE CROSS/BLUE SHIELD

## 2015-12-17 ENCOUNTER — Encounter: Payer: Self-pay | Admitting: Pulmonary Disease

## 2015-12-17 ENCOUNTER — Other Ambulatory Visit: Payer: Self-pay | Admitting: Pulmonary Disease

## 2015-12-17 ENCOUNTER — Ambulatory Visit: Payer: BLUE CROSS/BLUE SHIELD | Admitting: Pulmonary Disease

## 2015-12-17 VITALS — BP 126/74 | HR 85 | Ht 66.0 in | Wt 193.0 lb

## 2015-12-17 DIAGNOSIS — R05 Cough: Secondary | ICD-10-CM | POA: Diagnosis not present

## 2015-12-17 DIAGNOSIS — J309 Allergic rhinitis, unspecified: Secondary | ICD-10-CM | POA: Diagnosis not present

## 2015-12-17 DIAGNOSIS — K219 Gastro-esophageal reflux disease without esophagitis: Secondary | ICD-10-CM | POA: Diagnosis not present

## 2015-12-17 DIAGNOSIS — R059 Cough, unspecified: Secondary | ICD-10-CM

## 2015-12-17 LAB — PULMONARY FUNCTION TEST
FEF 25-75 POST: 1.91 L/s
FEF 25-75 PRE: 1.49 L/s
FEF2575-%Change-Post: 28 %
FEF2575-%PRED-PRE: 63 %
FEF2575-%Pred-Post: 80 %
FEV1-%CHANGE-POST: 7 %
FEV1-%PRED-POST: 70 %
FEV1-%PRED-PRE: 65 %
FEV1-POST: 1.88 L
FEV1-Pre: 1.75 L
FEV1FVC-%Change-Post: 4 %
FEV1FVC-%PRED-PRE: 101 %
FEV6-%Change-Post: 2 %
FEV6-%Pred-Post: 67 %
FEV6-%Pred-Pre: 65 %
FEV6-POST: 2.27 L
FEV6-Pre: 2.2 L
FEV6FVC-%CHANGE-POST: 0 %
FEV6FVC-%Pred-Post: 104 %
FEV6FVC-%Pred-Pre: 103 %
FVC-%Change-Post: 3 %
FVC-%PRED-POST: 65 %
FVC-%Pred-Pre: 63 %
FVC-PRE: 2.21 L
FVC-Post: 2.28 L
POST FEV1/FVC RATIO: 82 %
Post FEV6/FVC ratio: 100 %
Pre FEV1/FVC ratio: 79 %
Pre FEV6/FVC Ratio: 100 %

## 2015-12-17 MED ORDER — MONTELUKAST SODIUM 10 MG PO TABS: 10.0000 mg | ORAL_TABLET | Freq: Every day | ORAL | 0 refills | Status: DC

## 2015-12-17 NOTE — Progress Notes (Signed)
PFT 12/17/15: FVC 2.21 L (63%) FEV1 1.75 L (65%) FEV1/FVC 0.79 FEF 25-75 1.49 L (63%) negative bronchodilator response

## 2015-12-17 NOTE — Patient Instructions (Signed)
   Try going back on the Symbicort inhaler you have. Remember to do 2 puffs twice daily being sure to rinse, gargle & spit afterward to keep from getting thrush.  I'm giving you a month of Singulair to help with your allergies.  Call me if you want refills on the Symbicort or Singulair if they seem to help.  I will see you back in 8 weeks or sooner if needed.  TESTS ORDERED: 1. RAST Panel

## 2015-12-17 NOTE — Progress Notes (Signed)
Subjective:    Patient ID: Karen Hall, female    DOB: May 08, 1955, 60 y.o.   MRN: [id number]  C.C.:  Follow-up for Chronic Bronchitis/Cough, Allergic Rhinitis, & GERD.  HPI Chronic Bronchitis/Cough: Patient has had an episode of post-tussive emesis recently. She does report coughing more with bending over and when she is outdoors. She reports her cough is "random". She denies any cough with exposure to inhaled irritants or fumes. No recent antibiotics. Cough occasionally produces a clear mucus.   Allergic Rhinitis: No evidence of sinus disease on maxillofacial CT scan. Denies any sinus congestion, pressure, or drainage.   GERD: Referred to GI at last appointment & changed to Protonix bid & Zantac qhs at last appointment. Has an appointment on 10/12 with Dr. Havery Moros. No reflux or dyspepsia. No morning brash water taste. She does have the head of her bed angled upward.   Review of Systems Denies any chest tightness, pain, or pressure. No fever, chills, or sweats. No rashes or bruising.   Allergies  Allergen Reactions  . Aspartame And Phenylalanine Other (See Comments)    Headache    Current Outpatient Prescriptions on File Prior to Visit  Medication Sig Dispense Refill  . atorvastatin (LIPITOR) 10 MG tablet TAKE 1 TABLET BY MOUTH EVERY DAY 30 tablet 3  . citalopram (CELEXA) 20 MG tablet TAKE 1 TABLET BY MOUTH EVERY DAY 30 tablet 0  . diphenhydramine-acetaminophen (TYLENOL PM) 25-500 MG TABS Take 1 tablet by mouth at bedtime as needed (sleep/pain).     Marland Kitchen losartan-hydrochlorothiazide (HYZAAR) 50-12.5 MG tablet TAKE 0.5 TABLETS BY MOUTH DAILY. 45 tablet 1  . pantoprazole (PROTONIX) 40 MG tablet TAKE 1 TABLET (40 MG TOTAL) BY MOUTH 2 (TWO) TIMES DAILY. 60 tablet 3  . ranitidine (ZANTAC) 300 MG tablet TAKE 1 TABLET (300 MG TOTAL) BY MOUTH AT BEDTIME. 30 tablet 3   No current facility-administered medications on file prior to visit.     Past Medical History:  Diagnosis Date  .  ANXIETY 10/18/2007   Qualifier: Diagnosis of  By: Sarajane Jews MD, Ishmael Holter   . Chronic low back pain   . GERD 10/18/2007   Qualifier: Diagnosis of  By: Sherlynn Stalls, CMA, [place]    . Hyperlipemia 03/12/2014  . HYPERTENSION 10/18/2007   Qualifier: Diagnosis of  By: Sherlynn Stalls, CMA, [place]    . Insomnia 03/12/2014  . Irritable bowel syndrome 10/18/2007   Qualifier: Diagnosis of  By: Sarajane Jews MD, Ishmael Holter   . Recurrent knee pain    R knee hx meniscal tear 2002 with repair    Past Surgical History:  Procedure Laterality Date  . BREAST SURGERY     fibroid  . MENISCUS REPAIR Right   . TONSILLECTOMY      Family History  Problem Relation Age of Onset  . Cancer Mother     melanoma  . Hyperlipidemia Mother   . Cancer Father     liver  . Cancer Maternal Grandfather   . Heart disease Paternal Grandfather   . Lung disease Neg Hx     Social History   Social History  . Marital status: Widowed    Spouse name: N/A  . Number of children: N/A  . Years of education: N/A   Social History Main Topics  . Smoking status: Never Smoker  . Smokeless tobacco: Never Used  . Alcohol use No  . Drug use: No  . Sexual activity: Not Asked   Other Topics Concern  . None   Social History  Narrative   Work or School: Press photographer - [employer]       Home Situation: lives alone      Spiritual Beliefs: none      Lifestyle: no regular exercise; poor diet      [place] [facility]:   Originally from [place]. She has lived in [place], [place], [place], [place]. She has a dog currently. No bird exposure. No hot tub exposure. No mold exposure. Currently works as a Adult nurse.             Objective:   Physical Exam BP 126/74 (BP Location: Left Arm, Cuff Size: Normal)   Pulse 85   Ht 5\' 6"  (1.676 m)   Wt 193 lb (87.5 kg)   SpO2 97%   BMI 31.15 kg/m  General:  Awake. Alert. Mild central obesity.Comfortable with intermittent witnessed coughing. Integument:  Warm & dry. No rash on exposed skin.  Lymphatics:   No appreciated cervical or supraclavicular lymphadenoapthy. HEENT:  Moist mucus membranes. No oral ulcers. Minimal nasal turbinate swelling bilaterally. Cardiovascular:  Regular rate. No edema. Normal S1 & S2.  Pulmonary:   No accessory muscle use on room air. Clear bilaterally on auscultation with good aeration.  Abdomen: Soft. Normal bowel sounds. Mildly protuberant.  PFT 12/17/15: FVC 2.21 L (63%) FEV1 1.75 L (65%) FEV1/FVC 0.79 FEF 25-75 1.49 L (63%) negative bronchodilator response 07/30/15: FVC 2.54 L (72%) FEV1 1.87 L (69%) FEV1/FVC 0.74 FEF 25-75 1.43 L (60%) negative bronchodilator response TLC 5.46 L (102%) RV 138% ERV 52% DLCO uncorrected 50%  IMAGING BARIUM SWALLOW 07/25/15 (per radiologist): Full column gastroesophageal reflux. Asymmetric elevation right hemidiaphragm.  MAXILLOFACIAL CT W/O 07/25/15 (per radiologist): No significant paranasal sinus disease.  CXR PA/LAT 07/04/15 (previously reviewed by me): Chronic elevation right hemidiaphragm. Questionable atelectasis. No appreciated pleural effusion. No nodule or opacity. Heart normal in size. Mediastinum normal in contour.  CTA CHEST 02/20/15 (previously reviewed by me): No pleural effusion or thickening appreciated. No pathologic mediastinal adenopathy. Air bronchograms with right basilar atelectasis versus consolidation. No other parenchymal nodule appreciated. No pericardial effusion.  LABS 07/21/15 IgG: 1230 IgA: 70 IgM: 214    Assessment & Plan:  60 y.o. female with chronic bronchitis and ongoing cough likely secondary to uncontrolled reflux given barium swallow. It is possible there is some element of seasonal allergies contributing to her reactive airways disease. I am trying the patient again on inhaled steroid therapy and bronchodilator therapy. She has no signs of overt allergic rhinitis or postnasal drainage today. She remains asymptomatic with regards to her reflux. Her spirometry today has significantly worsened but  she continues to have no significant bronchodilator response. I instructed the patient to contact my office if she had any further questions or concerns before next appointment.  1. Chronic Bronchitis/Cough:  Likely secondary to underlying reflux. Starting Singulair 10 mg by mouth daily at bedtime. Patient advised to try using Symbicort 80/4.5 inhaler to help achieve some relief. 2. GERD:  Keeping appointment w/ [facility]. Continuing Protonix & Zantac as prescribed. 3. Allergic Rhinitis: Starting Singulair 10 mg by mouth daily at bedtime. Checking RAST panel.  4. Health Maintenance: S/P Pneumovax 08 May 2015 & Tdap December 2015.Patient declines influenza vaccine. 5. Follow-up: Return to clinic in 8 weeks or sooner if needed.  Sonia Baller Ashok Cordia, M.D. [facility] Pulmonary & Critical Care Pager:  [phone] After 3pm or if no response, call [phone] 3:48 PM 12/17/15

## 2015-12-18 LAB — RESPIRATORY ALLERGY PROFILE REGION II ~~LOC~~
Allergen, A. alternata, m6: <0.1 kU/L
Allergen, Cedar tree, t12: <0.1 kU/L
Allergen, Comm Silver Birch, t9: <0.1 kU/L
Allergen, Mouse Urine Protein, e78: <0.1 kU/L
Allergen, Oak,t7: <0.1 kU/L
Allergen, P. notatum, m1: <0.1 kU/L
Aspergillus fumigatus, m3: <0.1 kU/L
BERMUDA GRASS: 0.12 kU/L — AB
Box Elder IgE: <0.1 kU/L
Cat Dander: <0.1 kU/L
Common Ragweed: <0.1 kU/L
IgE (Immunoglobulin E), Serum: 13 kU/L (ref <115)
Pecan/Hickory Tree IgE: <0.1 kU/L
Rough Pigweed  IgE: <0.1 kU/L
Sheep Sorrel IgE: <0.1 kU/L
TIMOTHY GRASS: 0.11 kU/L — AB

## 2015-12-25 ENCOUNTER — Encounter: Payer: Self-pay | Admitting: Gastroenterology

## 2015-12-25 ENCOUNTER — Ambulatory Visit (INDEPENDENT_AMBULATORY_CARE_PROVIDER_SITE_OTHER): Payer: BLUE CROSS/BLUE SHIELD | Admitting: Gastroenterology

## 2015-12-25 ENCOUNTER — Other Ambulatory Visit: Payer: BLUE CROSS/BLUE SHIELD

## 2015-12-25 VITALS — BP 82/60 | HR 80 | Ht 66.0 in | Wt 193.6 lb

## 2015-12-25 DIAGNOSIS — Z1211 Encounter for screening for malignant neoplasm of colon: Secondary | ICD-10-CM

## 2015-12-25 DIAGNOSIS — K219 Gastro-esophageal reflux disease without esophagitis: Secondary | ICD-10-CM

## 2015-12-25 DIAGNOSIS — R05 Cough: Secondary | ICD-10-CM

## 2015-12-25 DIAGNOSIS — R053 Chronic cough: Secondary | ICD-10-CM

## 2015-12-25 NOTE — Progress Notes (Signed)
HPI :  60 y/o female with a history of GERD, HTN and chronic cough, here for new patient assessment for cough and reflux.   Symptoms of reflux for > 20 years, using OTC regimens mostly. Last year with severe symptoms of cough which has been chronic since it started. She has severe cough for which she sees Dr. Ashok Cordia. She has coughing fits so severe it can be associated with vomiting. She does not feel regurgitation or heartburn while on PPI. No dysphagia or odynophagia. No vomiting or postprandial pains. Coughing mostly during the day, not too much when sleeping. She has tried to sleep with head of bed elevated, coughs when lying with left side down. She has been seen by allergist without clear triggers, currently on singulair for the past week.   She is taking protonix 40mg  BID and zantac 300mg  qHS. NO FH of esophageal cancer. She has had a prior endoscopy in the 1990s without report available. Barium swallow has showed evidence of reflux in May.  Last colonoscopy 2006, normal per patient. No blood in the stools. No FH of colon cancer.    Past Medical History:  Diagnosis Date  . ANXIETY 10/18/2007   Qualifier: Diagnosis of  By: Sarajane Jews MD, Ishmael Holter   . Chronic low back pain   . GERD 10/18/2007   Qualifier: Diagnosis of  By: Sherlynn Stalls, CMA, [place]    . Hyperlipemia 03/12/2014  . HYPERTENSION 10/18/2007   Qualifier: Diagnosis of  By: Sherlynn Stalls, CMA, [place]    . Insomnia 03/12/2014  . Irritable bowel syndrome 10/18/2007   Qualifier: Diagnosis of  By: Sarajane Jews MD, Ishmael Holter   . Recurrent knee pain    R knee hx meniscal tear 2002 with repair     Past Surgical History:  Procedure Laterality Date  . BREAST SURGERY     fibroid  . MENISCUS REPAIR Right   . TONSILLECTOMY     Family History  Problem Relation Age of Onset  . Cancer Mother     melanoma  . Hyperlipidemia Mother   . Liver cancer Father   . Cancer Maternal Grandfather     unknown type  . Heart disease Paternal Grandfather   . Diabetes  Maternal Aunt   . Diabetes Sister   . Lung disease Neg Hx   . Colon cancer Neg Hx    Social History  Substance Use Topics  . Smoking status: Never Smoker  . Smokeless tobacco: Never Used  . Alcohol use 0.0 oz/week     Comment: 1 drink monthly   Current Outpatient Prescriptions  Medication Sig Dispense Refill  . atorvastatin (LIPITOR) 10 MG tablet TAKE 1 TABLET BY MOUTH EVERY DAY 30 tablet 3  . citalopram (CELEXA) 20 MG tablet TAKE 1 TABLET BY MOUTH EVERY DAY 30 tablet 0  . diphenhydramine-acetaminophen (TYLENOL PM) 25-500 MG TABS Take 1 tablet by mouth at bedtime as needed (sleep/pain).     Marland Kitchen losartan-hydrochlorothiazide (HYZAAR) 50-12.5 MG tablet TAKE 0.5 TABLETS BY MOUTH DAILY. 45 tablet 1  . montelukast (SINGULAIR) 10 MG tablet Take 1 tablet (10 mg total) by mouth at bedtime. 30 tablet 0  . pantoprazole (PROTONIX) 40 MG tablet TAKE 1 TABLET (40 MG TOTAL) BY MOUTH 2 (TWO) TIMES DAILY. 60 tablet 3  . ranitidine (ZANTAC) 300 MG tablet TAKE 1 TABLET (300 MG TOTAL) BY MOUTH AT BEDTIME. 30 tablet 3   No current facility-administered medications for this visit.    Allergies  Allergen Reactions  . Aspartame And Phenylalanine  Other (See Comments)    Headache     Review of Systems: All systems reviewed and negative except where noted in HPI.    Lab Results  Component Value Date   WBC 9.0 02/20/2015   HGB 13.5 02/20/2015   HCT 41.1 02/20/2015   MCV 86.0 02/20/2015   PLT 275 02/20/2015    Lab Results  Component Value Date   CREATININE 1.40 (H) 02/20/2015   BUN 19 02/20/2015   NA 137 02/20/2015   K 3.7 02/20/2015   CL 104 02/20/2015   CO2 23 02/20/2015    No results found for: ALT, AST, GGT, ALKPHOS, BILITOT    Physical Exam: BP (!) 82/60   Pulse 80   Ht 5\' 6"  (1.676 m)   Wt 193 lb 9.6 oz (87.8 kg)   BMI 31.25 kg/m  Constitutional: Pleasant,well-developed, female in no acute distress. HEENT: Normocephalic and atraumatic. Conjunctivae are normal. No scleral  icterus. Neck supple.  Cardiovascular: Normal rate, regular rhythm.  Pulmonary/chest: Effort normal and breath sounds normal. No wheezing, rales or rhonchi. Abdominal: Soft, nondistended, nontender. There are no masses palpable. No hepatomegaly. Extremities: no edema Lymphadenopathy: No cervical adenopathy noted. Neurological: Alert and oriented to person place and time. Skin: Skin is warm and dry. No rashes noted. Psychiatric: Normal mood and affect. Behavior is normal.   ASSESSMENT AND PLAN: 60 year old female with medical history as above, here for evaluation of the following issues:  GERD/chronic cough - it is quite possible that she has GERD causing her cough though she is not responded much at all to high-dose PPI and Zantac. She has had chronic long-standing GERD symptoms otherwise with a remote EGD in the 1990s and we don't have results of this. To further evaluate her symptoms and reflux recommending an upper endoscopy. I discussed the risks and benefits of endoscopy with her, and following this discussion she wanted to proceed. I otherwise anticipate we are more than likely going to perform esophageal manometry with 24-hour pH impedance testing, pending  the results of her endoscopy, which would be done on twice daily PPI and Zantac to assess for breakthrough acid reflux versus nonacid reflux versus hypersensitive esophagus. She may be a candidate for surgical reflux therapy pending this workup. She agrees with the plan, she will continue her regimen until endoscopy.  Colon cancer screening -she is at average risk for colon cancer and is due for screening. I recommended optical colonoscopy as first-line screening tests which we can perform at the time of her upper endoscopy. After discussion of risks and benefits history strongly desires to avoid any future colonoscopies at all possible. In this light I offered her FIT testing. We will perform this now and then yearly. She understands if  this is positive she will be agreeable to colonoscopy.   Section Cellar, MD [facility] Pager [phone]  CC: [name], DO

## 2015-12-25 NOTE — Patient Instructions (Addendum)
If you are age 62 or older, your body mass index should be between 23-30. Your Body mass index is 31.25 kg/m. If this is out of the aforementioned range listed, please consider follow up with your Primary Care Provider.  If you are age 8 or younger, your body mass index should be between 19-25. Your Body mass index is 31.25 kg/m. If this is out of the aformentioned range listed, please consider follow up with your Primary Care Provider.   Your physician has requested that you go to the basement for lab work before leaving today: Stool test  Please call back in January to schedule your endoscopy procedure.

## 2016-01-08 ENCOUNTER — Other Ambulatory Visit (INDEPENDENT_AMBULATORY_CARE_PROVIDER_SITE_OTHER): Payer: BLUE CROSS/BLUE SHIELD

## 2016-01-08 ENCOUNTER — Other Ambulatory Visit: Payer: Self-pay

## 2016-01-08 ENCOUNTER — Encounter: Payer: Self-pay | Admitting: Gastroenterology

## 2016-01-08 DIAGNOSIS — K219 Gastro-esophageal reflux disease without esophagitis: Secondary | ICD-10-CM

## 2016-01-08 DIAGNOSIS — R053 Chronic cough: Secondary | ICD-10-CM

## 2016-01-08 DIAGNOSIS — Z1211 Encounter for screening for malignant neoplasm of colon: Secondary | ICD-10-CM | POA: Diagnosis not present

## 2016-01-08 DIAGNOSIS — R05 Cough: Secondary | ICD-10-CM

## 2016-01-08 LAB — FECAL OCCULT BLOOD, IMMUNOCHEMICAL: FECAL OCCULT BLD: NEGATIVE

## 2016-01-08 MED ORDER — ATORVASTATIN CALCIUM 10 MG PO TABS: 10.0000 mg | ORAL_TABLET | Freq: Every day | ORAL | 3 refills | Status: DC

## 2016-01-08 MED ORDER — CITALOPRAM HYDROBROMIDE 20 MG PO TABS: 20.0000 mg | ORAL_TABLET | Freq: Every day | ORAL | 5 refills | Status: DC

## 2016-01-09 ENCOUNTER — Other Ambulatory Visit: Payer: Self-pay

## 2016-01-09 DIAGNOSIS — K219 Gastro-esophageal reflux disease without esophagitis: Secondary | ICD-10-CM

## 2016-01-09 MED ORDER — MONTELUKAST SODIUM 10 MG PO TABS: 10.0000 mg | ORAL_TABLET | Freq: Every day | ORAL | 1 refills | Status: DC

## 2016-01-09 MED ORDER — RANITIDINE HCL 300 MG PO TABS: 300.0000 mg | ORAL_TABLET | Freq: Every day | ORAL | 1 refills | Status: DC

## 2016-01-09 NOTE — Progress Notes (Signed)
Letter mailed. jf 

## 2016-01-22 ENCOUNTER — Telehealth: Payer: Self-pay | Admitting: Family Medicine

## 2016-01-22 MED ORDER — CITALOPRAM HYDROBROMIDE 20 MG PO TABS: 20.0000 mg | ORAL_TABLET | Freq: Every day | ORAL | 0 refills | Status: DC

## 2016-01-22 NOTE — Telephone Encounter (Signed)
Rx done. 

## 2016-01-22 NOTE — Telephone Encounter (Signed)
° °  Pt request refill of the following:  citalopram (CELEXA) 20 MG tablet   Phamacy: [facility] [address]   Pt is scheduled for a physical in December

## 2016-02-18 ENCOUNTER — Other Ambulatory Visit: Payer: Self-pay | Admitting: Family Medicine

## 2016-02-23 ENCOUNTER — Ambulatory Visit: Payer: BLUE CROSS/BLUE SHIELD | Admitting: Pulmonary Disease

## 2016-02-24 ENCOUNTER — Ambulatory Visit (INDEPENDENT_AMBULATORY_CARE_PROVIDER_SITE_OTHER): Payer: BLUE CROSS/BLUE SHIELD | Admitting: Family Medicine

## 2016-02-24 ENCOUNTER — Encounter: Payer: Self-pay | Admitting: Family Medicine

## 2016-02-24 VITALS — BP 100/82 | HR 79 | Temp 98.1°F | Ht 66.0 in | Wt 196.3 lb

## 2016-02-24 DIAGNOSIS — Z0000 Encounter for general adult medical examination without abnormal findings: Secondary | ICD-10-CM | POA: Diagnosis not present

## 2016-02-24 DIAGNOSIS — E119 Type 2 diabetes mellitus without complications: Secondary | ICD-10-CM

## 2016-02-24 DIAGNOSIS — E785 Hyperlipidemia, unspecified: Secondary | ICD-10-CM

## 2016-02-24 DIAGNOSIS — I10 Essential (primary) hypertension: Secondary | ICD-10-CM

## 2016-02-24 DIAGNOSIS — D229 Melanocytic nevi, unspecified: Secondary | ICD-10-CM | POA: Insufficient documentation

## 2016-02-24 DIAGNOSIS — R1011 Right upper quadrant pain: Secondary | ICD-10-CM

## 2016-02-24 HISTORY — DX: Type 2 diabetes mellitus without complications: E11.9

## 2016-02-24 HISTORY — DX: Melanocytic nevi, unspecified: D22.9

## 2016-02-24 NOTE — Progress Notes (Signed)
Pre visit review using our clinic review tool, if applicable. No additional management support is needed unless otherwise documented below in the visit note. 

## 2016-02-24 NOTE — Progress Notes (Signed)
HPI:  Here for CPE:  -Concerns and/or follow up today: none for me today -PMH mild diabetes, hld, obesity, htn, chronic cough -wants labs -had a rough year - [place] and had smal tibial fx -has seen a number of specialist for her chronic cough and seeing GI for silent reflux -she reports she had eye exam with Dr. Katy Fitch -diet and exercise not great -Taking folic acid, vitamin D or calcium: no -also has hx gallstones and had episode RUQ pain for a few hours about 1 week ago, no vomiting or diarrhea, no pain since  -Diabetes and Dyslipidemia Screening:not fasting  -Hx of HTN: no  -Vaccines: declined flu vaccine  -pap history: reports all normal, normal 2 years ago  -FDLMP: n/a  -sexual activity: yes, female partner, no new partners  -wants STI testing (Hep C if born [birth year]): no  -FH breast, colon or ovarian ca: see FH Last mammogram: normal 2 years ago, agrees to schedule Last colon cancer screening: utd, seeing GI  -Alcohol, Tobacco, drug use: see social history  Review of Systems - no fevers, unintentional weight loss, vision loss, hearing loss, chest pain, sob, hemoptysis, melena, hematochezia, hematuria, genital discharge, changing or concerning skin lesions, bleeding, bruising, loc, thoughts of self harm or SI  Past Medical History:  Diagnosis Date  . ANXIETY 10/18/2007   Qualifier: Diagnosis of  By: Sarajane Jews MD, Ishmael Holter   . Chronic low back pain   . Diabetes ([place]) 02/24/2016  . GERD 10/18/2007   Qualifier: Diagnosis of  By: Sherlynn Stalls, CMA, [place]    . Hyperlipemia 03/12/2014  . HYPERTENSION 10/18/2007   Qualifier: Diagnosis of  By: Sherlynn Stalls, CMA, [place]    . Insomnia 03/12/2014  . Irritable bowel syndrome 10/18/2007   Qualifier: Diagnosis of  By: Sarajane Jews MD, Ishmael Holter   . Recurrent knee pain    R knee hx meniscal tear 2002 with repair    Past Surgical History:  Procedure Laterality Date  . BREAST SURGERY     fibroid  . MENISCUS REPAIR Right   . TONSILLECTOMY       Family History  Problem Relation Age of Onset  . Cancer Mother     melanoma  . Hyperlipidemia Mother   . Liver cancer Father   . Cancer Maternal Grandfather     unknown type  . Heart disease Paternal Grandfather   . Diabetes Maternal Aunt   . Diabetes Sister   . Lung disease Neg Hx   . Colon cancer Neg Hx     Social History   Social History  . Marital status: Widowed    Spouse name: N/A  . Number of children: N/A  . Years of education: N/A   Social History Main Topics  . Smoking status: Never Smoker  . Smokeless tobacco: Never Used  . Alcohol use 0.0 oz/week     Comment: 1 drink monthly  . Drug use: No  . Sexual activity: Not Asked   Other Topics Concern  . None   Social History Narrative   Work or School: Press photographer - [employer]       Home Situation: lives alone      Spiritual Beliefs: none      Lifestyle: no regular exercise; poor diet      [facility]:   Originally from [place]. She has lived in [place], [place], [place], [place]. She has a dog currently. No bird exposure. No hot tub exposure. No mold exposure. Currently works as  a Adult nurse.            Current Outpatient Prescriptions:  .  atorvastatin (LIPITOR) 10 MG tablet, Take 1 tablet (10 mg total) by mouth daily., Disp: 30 tablet, Rfl: 3 .  citalopram (CELEXA) 20 MG tablet, TAKE 1 TABLET BY MOUTH EVERY DAY, Disp: 30 tablet, Rfl: 0 .  diphenhydramine-acetaminophen (TYLENOL PM) 25-500 MG TABS, Take 1 tablet by mouth at bedtime as needed (sleep/pain). , Disp: , Rfl:  .  losartan-hydrochlorothiazide (HYZAAR) 50-12.5 MG tablet, TAKE 0.5 TABLETS BY MOUTH DAILY., Disp: 45 tablet, Rfl: 1 .  montelukast (SINGULAIR) 10 MG tablet, Take 1 tablet (10 mg total) by mouth at bedtime., Disp: 90 tablet, Rfl: 1 .  pantoprazole (PROTONIX) 40 MG tablet, TAKE 1 TABLET (40 MG TOTAL) BY MOUTH 2 (TWO) TIMES DAILY., Disp: 60 tablet, Rfl: 3 .  ranitidine (ZANTAC) 300 MG tablet, Take 1 tablet (300 mg  total) by mouth at bedtime., Disp: 90 tablet, Rfl: 1  EXAM:  Vitals:   02/24/16 1458  BP: 100/82  Pulse: 79  Temp: 98.1 F (36.7 C)   Body mass index is 31.68 kg/m.  GENERAL: vitals reviewed and listed below, alert, oriented, appears well hydrated and in no acute distress  HEENT: head atraumatic, PERRLA, normal appearance of eyes, ears, nose and mouth. moist mucus membranes.  NECK: supple, no masses or lymphadenopathy  LUNGS: clear to auscultation bilaterally, no rales, rhonchi or wheeze  CV: HRRR, no peripheral edema or cyanosis, normal pedal pulses  ABDOMEN: bowel sounds normal, soft, non tender to palpation, no masses, no rebound or guarding  BREAST: normal appearance - no skin lesions or discharge noted on inspection of both breasts, on palpation of both breast and axillary region no suspicious lesions appreciated today  GU: declined  RECTAL: deferred  SKIN: no rash or abnormal lesions, 2x3 mm dark nevus R upper back  MS: normal gait, moves all extremities normally  NEURO: normal gait, speech and thought processing grossly intact, muscle tone grossly intact throughout  PSYCH: normal affect, pleasant and cooperative  ASSESSMENT AND PLAN:  Discussed the following assessment and plan:  Encounter for preventive health examination - Plan: Hepatic function panel  Essential hypertension - Plan: Basic metabolic panel, CBC (no diff)  Hyperlipidemia, unspecified hyperlipidemia type - Plan: Cholesterol, Total, HDL cholesterol  Type 2 diabetes mellitus without complication, without long-term current use of insulin (HCC) - Plan: Hemoglobin A1c  RUQ pain  Nevus - r upper back  -labs  -advised to let us know provider name whom did pap so we can obtain report   -advised assistant to obtain eye report  -advised biopsy atypical nevus R upper back, she declined  -advised to schedule mammo and # provided to call  -discussed causes RUQ pain, she reports known  gallstones and episode pain after meal that she thinks was biliary colic, normal exam today, labs pending, offered referral to gen surg if recurs or if wishes to discuss chole, she opted to hold off for now  -Discussed and advised all [place] preventive services health task force level A and B recommendations for age, sex and risks.  -Advised at least 150 minutes of exercise per week and a healthy diet with avoidance of (less then 1 serving per week) processed foods, white starches, red meat, fast foods and sweets and consisting of: * 5-9 servings of fresh fruits and vegetables (not corn or potatoes) *nuts and seeds, beans *olives and olive oil *lean meats such as fish and white  chicken  *whole grains  -labs, studies and vaccines per orders this encounter  Orders Placed This Encounter  Procedures  . Hemoglobin A1c  . Basic metabolic panel  . CBC (no diff)  . Cholesterol, Total  . HDL cholesterol  . Hepatic function panel    Standing Status:   Future    Standing Expiration Date:   02/23/2017    Patient advised to return to clinic immediately if symptoms worsen or persist or new concerns.  Patient Instructions  BEFORE YOU LEAVE: -labs -obtain eye report -follow up: 3-4 months  Call to schedule your mammogram at the breast center  We have ordered labs or studies at this visit. It can take up to 1-2 weeks for results and processing. IF results require follow up or explanation, we will call you with instructions. Clinically stable results will be released to your [facility]. If you have not heard from [place] or cannot find your results in [facility] in 2 weeks please contact our office at [phone].  If you are not yet signed up for [facility], please consider signing up.   We recommend the following healthy lifestyle for LIFE: 1) Small portions.   Tip: eat off of a salad plate instead of a dinner plate.  Tip: It is ok to feel hungry after a meal of proper portion sizes: if you need more or a  snack choose fruits, veggies and/or a handful of nuts or seeds.  2) Eat a healthy clean diet.  * Tip: Avoid (less then 1 serving per week): processed foods, sweets, sweetened drinks, white starches (rice, flour, bread, potatoes, pasta, etc), red meat, fast foods, butter  *Tip: CHOOSE instead   * 5-9 servings per day of fresh or frozen fruits and vegetables (but not corn, potatoes, bananas, canned or dried fruit)   *nuts and seeds, beans   *olives and olive oil   *small portions of lean meats such as fish and white chicken    *small portions of whole grains  3)Get at least 150 minutes of sweaty aerobic exercise per week.  4)Reduce stress - consider counseling, meditation and relaxation to balance other aspects of your life.         No Follow-up on file.  Colin Benton R., DO

## 2016-02-24 NOTE — Patient Instructions (Signed)
BEFORE YOU LEAVE: -labs -obtain eye report -follow up: 3-4 months  Call to schedule your mammogram at the breast center  We have ordered labs or studies at this visit. It can take up to 1-2 weeks for results and processing. IF results require follow up or explanation, we will call you with instructions. Clinically stable results will be released to your [facility]. If you have not heard from [place] or cannot find your results in [facility] in 2 weeks please contact our office at [phone].  If you are not yet signed up for [facility], please consider signing up.   We recommend the following healthy lifestyle for LIFE: 1) Small portions.   Tip: eat off of a salad plate instead of a dinner plate.  Tip: It is ok to feel hungry after a meal of proper portion sizes: if you need more or a snack choose fruits, veggies and/or a handful of nuts or seeds.  2) Eat a healthy clean diet.  * Tip: Avoid (less then 1 serving per week): processed foods, sweets, sweetened drinks, white starches (rice, flour, bread, potatoes, pasta, etc), red meat, fast foods, butter  *Tip: CHOOSE instead   * 5-9 servings per day of fresh or frozen fruits and vegetables (but not corn, potatoes, bananas, canned or dried fruit)   *nuts and seeds, beans   *olives and olive oil   *small portions of lean meats such as fish and white chicken    *small portions of whole grains  3)Get at least 150 minutes of sweaty aerobic exercise per week.  4)Reduce stress - consider counseling, meditation and relaxation to balance other aspects of your life.

## 2016-02-25 LAB — HDL CHOLESTEROL: HDL: 46.5 mg/dL (ref >39.00)

## 2016-02-25 LAB — BASIC METABOLIC PANEL
BUN: 23 mg/dL (ref 6–23)
CO2: 29 mEq/L (ref 19–32)
Calcium: 9.7 mg/dL (ref 8.4–10.5)
Chloride: 105 mEq/L (ref 96–112)
Creatinine, Ser: 1.24 mg/dL — ABNORMAL HIGH (ref 0.40–1.20)
GFR: 46.5 mL/min — AB (ref >60.00)
Glucose, Bld: 79 mg/dL (ref 70–99)
POTASSIUM: 4 meq/L (ref 3.5–5.1)
SODIUM: 141 meq/L (ref 135–145)

## 2016-02-25 LAB — CBC
HCT: 41.1 % (ref 36.0–46.0)
HEMOGLOBIN: 13.5 g/dL (ref 12.0–15.0)
MCHC: 32.8 g/dL (ref 30.0–36.0)
MCV: 84.9 fl (ref 78.0–100.0)
PLATELETS: 268 10*3/uL (ref 150.0–400.0)
RBC: 4.84 Mil/uL (ref 3.87–5.11)
RDW: 13.4 % (ref 11.5–15.5)
WBC: 8.3 10*3/uL (ref 4.0–10.5)

## 2016-02-25 LAB — HEMOGLOBIN A1C: Hgb A1c MFr Bld: 6.3 % (ref 4.6–6.5)

## 2016-02-25 LAB — CHOLESTEROL, TOTAL: Cholesterol: 165 mg/dL (ref 0–200)

## 2016-02-26 ENCOUNTER — Telehealth: Payer: Self-pay | Admitting: Family Medicine

## 2016-02-26 ENCOUNTER — Encounter: Payer: Self-pay | Admitting: Family Medicine

## 2016-02-26 NOTE — Telephone Encounter (Signed)
Karen Hall with [facility] would like you to call back.  They are confused about the message you left and need clarification. Please call back.

## 2016-02-26 NOTE — Telephone Encounter (Signed)
I called Cassandra and she stated according to the pts chart she does not have diabetic retinopathy and this was updated in the pts chart.

## 2016-03-11 LAB — HM DIABETES EYE EXAM

## 2016-03-18 ENCOUNTER — Other Ambulatory Visit: Payer: Self-pay | Admitting: Family Medicine

## 2016-03-23 ENCOUNTER — Encounter: Payer: Self-pay | Admitting: Family Medicine

## 2016-04-05 ENCOUNTER — Encounter: Payer: Self-pay | Admitting: Gastroenterology

## 2016-04-10 ENCOUNTER — Other Ambulatory Visit: Payer: Self-pay | Admitting: Pulmonary Disease

## 2016-04-10 DIAGNOSIS — K219 Gastro-esophageal reflux disease without esophagitis: Secondary | ICD-10-CM

## 2016-05-03 ENCOUNTER — Encounter: Payer: BLUE CROSS/BLUE SHIELD | Admitting: Gastroenterology

## 2016-05-04 ENCOUNTER — Other Ambulatory Visit: Payer: Self-pay | Admitting: Family Medicine

## 2016-05-25 ENCOUNTER — Ambulatory Visit: Payer: BLUE CROSS/BLUE SHIELD | Admitting: Family Medicine

## 2016-06-01 ENCOUNTER — Other Ambulatory Visit: Payer: Self-pay | Admitting: *Deleted

## 2016-06-01 MED ORDER — LOSARTAN POTASSIUM-HCTZ 50-12.5 MG PO TABS: ORAL_TABLET | ORAL | 1 refills | Status: DC

## 2016-06-01 MED ORDER — CITALOPRAM HYDROBROMIDE 20 MG PO TABS: 20.0000 mg | ORAL_TABLET | Freq: Every day | ORAL | 1 refills | Status: DC

## 2016-06-01 MED ORDER — ATORVASTATIN CALCIUM 10 MG PO TABS: 10.0000 mg | ORAL_TABLET | Freq: Every day | ORAL | 1 refills | Status: DC

## 2016-06-01 NOTE — Telephone Encounter (Signed)
Rx done. 

## 2016-07-06 ENCOUNTER — Other Ambulatory Visit: Payer: Self-pay | Admitting: *Deleted

## 2016-07-06 MED ORDER — ATORVASTATIN CALCIUM 10 MG PO TABS: 10.0000 mg | ORAL_TABLET | Freq: Every day | ORAL | 0 refills | Status: DC

## 2016-07-06 MED ORDER — CITALOPRAM HYDROBROMIDE 20 MG PO TABS: 20.0000 mg | ORAL_TABLET | Freq: Every day | ORAL | 0 refills | Status: DC

## 2016-07-06 NOTE — Telephone Encounter (Signed)
Rx done. 

## 2016-07-29 ENCOUNTER — Other Ambulatory Visit: Payer: Self-pay | Admitting: Pulmonary Disease

## 2016-08-21 ENCOUNTER — Other Ambulatory Visit: Payer: Self-pay | Admitting: Pulmonary Disease

## 2016-09-04 ENCOUNTER — Other Ambulatory Visit: Payer: Self-pay | Admitting: Family Medicine

## 2016-10-22 ENCOUNTER — Other Ambulatory Visit: Payer: Self-pay | Admitting: Pulmonary Disease

## 2016-10-22 DIAGNOSIS — K219 Gastro-esophageal reflux disease without esophagitis: Secondary | ICD-10-CM

## 2016-10-29 ENCOUNTER — Other Ambulatory Visit: Payer: Self-pay | Admitting: Pulmonary Disease

## 2016-10-31 ENCOUNTER — Other Ambulatory Visit: Payer: Self-pay | Admitting: Family Medicine

## 2016-11-25 ENCOUNTER — Other Ambulatory Visit: Payer: Self-pay

## 2016-11-25 DIAGNOSIS — Z1211 Encounter for screening for malignant neoplasm of colon: Secondary | ICD-10-CM

## 2016-12-22 ENCOUNTER — Other Ambulatory Visit: Payer: Self-pay | Admitting: Pulmonary Disease

## 2017-01-19 ENCOUNTER — Other Ambulatory Visit: Payer: Self-pay | Admitting: Pulmonary Disease

## 2017-01-19 DIAGNOSIS — K219 Gastro-esophageal reflux disease without esophagitis: Secondary | ICD-10-CM

## 2017-01-25 ENCOUNTER — Other Ambulatory Visit: Payer: Self-pay | Admitting: Family Medicine

## 2017-02-19 ENCOUNTER — Other Ambulatory Visit: Payer: Self-pay | Admitting: Pulmonary Disease

## 2017-02-20 ENCOUNTER — Other Ambulatory Visit: Payer: Self-pay | Admitting: Family Medicine

## 2017-02-28 ENCOUNTER — Other Ambulatory Visit: Payer: Self-pay | Admitting: Family Medicine

## 2017-03-04 ENCOUNTER — Other Ambulatory Visit: Payer: Self-pay | Admitting: Family Medicine

## 2017-03-14 ENCOUNTER — Other Ambulatory Visit: Payer: Self-pay | Admitting: Family Medicine

## 2017-04-05 ENCOUNTER — Other Ambulatory Visit: Payer: Self-pay | Admitting: *Deleted

## 2017-04-05 ENCOUNTER — Other Ambulatory Visit: Payer: Self-pay

## 2017-04-05 MED ORDER — LOSARTAN POTASSIUM-HCTZ 50-12.5 MG PO TABS: ORAL_TABLET | ORAL | 0 refills | Status: DC

## 2017-04-05 MED ORDER — ATORVASTATIN CALCIUM 10 MG PO TABS: 10.0000 mg | ORAL_TABLET | Freq: Every day | ORAL | 0 refills | Status: DC

## 2017-04-05 MED ORDER — CITALOPRAM HYDROBROMIDE 20 MG PO TABS: 20.0000 mg | ORAL_TABLET | Freq: Every day | ORAL | 0 refills | Status: DC

## 2017-04-05 NOTE — Telephone Encounter (Signed)
Rx done. 

## 2017-04-11 NOTE — Progress Notes (Signed)
HPI:  Here for CPE and follow-up: Due for Pap smear, colonoscopy, foot exam, mammogram, labs and eye exam  -Concerns and/or follow up today: Past medical history of borderline diet controlled diabetes, anxiety, hyperlipidemia, obesity, hypertension and chronic cough.  She rarely comes in for a visit.  Last seen over one year ago for physical.  Here for follow-up and her physical today.   She has several new concerns she wants to address today.  First of all she has had a rash under her breast and in her groin region on and off.  This is an itchy rash.  Tried essential oils for this.  No rash elsewhere.   She has right upper quadrant pain intermittently after meals.  Reports she was diagnosed with gallstones about 6 years ago recommended that she have her gallbladder out.  [place].  Now she thinks she is ready to pursue this.  She did not know who she need to see.  She has intermittent diarrhea as well, sees a gastroenterologist and has irritable bowel syndrome.  She reports she did her colon cancer screening last year with her gastroenterologist with the Cologuard test.  Reports she was seeing Dr. Ashok Cordia who is now retired.  She also was seeing him for acid reflux, which they reported was the cause of her cough.  She is on treatment for this.  She takes Celexa for the anxiety.  Reports her mood is okay.  She was under a lot of stress last year.  Lost her insurance, mother passed away with breast cancer.  She moved.  He now is doing much better.  She takes losartan and hydrochlorothiazide for her blood pressure.  She takes Lipitor for the cholesterol.  She is on Singulair and Protonix.  Denies chest pain, shortness of breath, headaches, swelling or palpitations.  -Diet: variety of foods, balance and well rounded, larger portion sizes -Exercise: no regular exercise -Taking folic acid, vitamin D or calcium: no -Diabetes and Dyslipidemia Screening: Fasting for labs -Vaccines: see  vaccine section EPIC -pap history: Reports all normal in the past, unsure of last Pap smear -FDLMP: see nursing notes -sexual activity: yes, female partner, no new partners -wants STI testing (Hep C if born [birth year]): no -FH breast, colon or ovarian ca: see FH Last mammogram: She agrees to schedule Last colon cancer screening: Reports done last year (2018)with Dr. Ashok Cordia, reports they did a Cologuard test Breast Ca Risk Assessment: see family history and pt history DEXA (>/= 65): Not applicable  -Alcohol, Tobacco, drug use: see social history  Review of Systems - no fevers, unintentional weight loss, vision loss, hearing loss, chest pain, sob, hemoptysis, melena, hematochezia, hematuria, genital discharge, changing or concerning skin lesions, bleeding, bruising, loc, thoughts of self harm or SI  Past Medical History:  Diagnosis Date  . ANXIETY 10/18/2007   Qualifier: Diagnosis of  By: Sarajane Jews MD, Ishmael Holter   . Chronic low back pain   . Diabetes ([place]) 02/24/2016  . GERD 10/18/2007   Qualifier: Diagnosis of  By: Sherlynn Stalls, CMA, [place]    . Hyperlipemia 03/12/2014  . HYPERTENSION 10/18/2007   Qualifier: Diagnosis of  By: Sherlynn Stalls, CMA, [place]    . Insomnia 03/12/2014  . Irritable bowel syndrome 10/18/2007   Qualifier: Diagnosis of  By: Sarajane Jews MD, Ishmael Holter   . Recurrent knee pain    R knee hx meniscal tear 2002 with repair    Past Surgical History:  Procedure Laterality Date  . BREAST SURGERY  fibroid  . MENISCUS REPAIR Right   . TONSILLECTOMY      Family History  Problem Relation Age of Onset  . Cancer Mother        melanoma  . Hyperlipidemia Mother   . Breast cancer Mother   . Liver cancer Father   . Cancer Maternal Grandfather        unknown type  . Heart disease Paternal Grandfather   . Diabetes Maternal Aunt   . Diabetes Sister   . Lung disease Neg Hx   . Colon cancer Neg Hx     Social History   Socioeconomic History  . Marital status: Widowed    Spouse name: None  . Number  of children: None  . Years of education: None  . Highest education level: None  Social Needs  . Financial resource strain: None  . Food insecurity - worry: None  . Food insecurity - inability: None  . Transportation needs - medical: None  . Transportation needs - non-medical: None  Occupational History  . None  Tobacco Use  . Smoking status: Never Smoker  . Smokeless tobacco: Never Used  Substance and Sexual Activity  . Alcohol use: Yes    Alcohol/week: 0.0 oz    Comment: 1 drink monthly  . Drug use: No  . Sexual activity: None  Other Topics Concern  . None  Social History Narrative   Work or School: Press photographer - [employer]       Home Situation: lives alone      Spiritual Beliefs: none      Lifestyle: no regular exercise; poor diet      [facility]:   Originally from [place]. She has lived in [place], [place], [place], [place]. She has a dog currently. No bird exposure. No hot tub exposure. No mold exposure. Currently works as a Adult nurse.         Current Outpatient Medications:  .  atorvastatin (LIPITOR) 10 MG tablet, Take 1 tablet (10 mg total) by mouth daily., Disp: 90 tablet, Rfl: 3 .  citalopram (CELEXA) 20 MG tablet, Take 1 tablet (20 mg total) by mouth daily., Disp: 90 tablet, Rfl: 3 .  diphenhydramine-acetaminophen (TYLENOL PM) 25-500 MG TABS, Take 1 tablet by mouth at bedtime as needed (sleep/pain). , Disp: , Rfl:  .  losartan-hydrochlorothiazide (HYZAAR) 50-12.5 MG tablet, TAKE 1/2 TABLET BY MOUTH EVERY DAY **PT NEEDS OFFICE VISIT, Disp: 90 tablet, Rfl: 3 .  montelukast (SINGULAIR) 10 MG tablet, TAKE 1 TABLET BY MOUTH AT BEDTIME, Disp: 90 tablet, Rfl: 1 .  pantoprazole (PROTONIX) 40 MG tablet, TAKE 1 TABLET BY MOUTH TWICE A DAY, Disp: 60 tablet, Rfl: 2 .  ranitidine (ZANTAC) 300 MG tablet, Take 1 tablet (300 mg total) by mouth at bedtime., Disp: 90 tablet, Rfl: 1 .  fluconazole (DIFLUCAN) 150 MG tablet, Take 1 tablet (150 mg total) by mouth  once for 1 dose. Repeat in 3 days., Disp: 2 tablet, Rfl: 0  EXAM:  Vitals:   04/12/17 1116  BP: 100/60  Pulse: 89  Temp: 98 F (36.7 C)    GENERAL: vitals reviewed and listed below, alert, oriented, appears well hydrated and in no acute distress  HEENT: head atraumatic, PERRLA, normal appearance of eyes, ears, nose and mouth. moist mucus membranes.  NECK: supple, no masses or lymphadenopathy  LUNGS: clear to auscultation bilaterally, no rales, rhonchi or wheeze  CV: HRRR, no peripheral edema or cyanosis, normal pedal pulses  ABDOMEN: bowel sounds normal,  soft, non tender to palpation, no masses, no rebound or guarding  BREAST: Erythematous, sharply demarcated mildly scaly rash under both breasts inframammary folds, no discharge noted on inspection of both breasts, on palpation of both breast and axillary region no suspicious lesions appreciated today  GU: normal appearance of external genitalia - no lesions or masses appreciated, normal appearing vaginal mucosa - no abnormal discharge, normal appearance of cervix - no lesions or abnormal discharge observed  RECTAL: deferred  SKIN: no rash or abnormal lesions  MS: normal gait, moves all extremities normally  NEURO: normal gait, speech and thought processing grossly intact, muscle tone grossly intact throughout  PSYCH: normal affect, pleasant and cooperative  ASSESSMENT AND PLAN:  Discussed the following assessment and plan:  1. PREVENTIVE EXAM: -Discussed and advised all [place] preventive services health task force level A and B recommendations for age, sex and risks. -Advised at least 150 minutes of exercise per week and a healthy diet with avoidance of (less then 1 serving per week) processed foods, white starches, red meat, fast foods and sweets and consisting of: * 5-9 servings of fresh fruits and vegetables (not corn or potatoes) *nuts and seeds, beans *olives and olive oil *lean meats such as fish and white chicken   *whole grains -labs, studies and vaccines per orders this encounter  2. Intertrigo -diflucan and topical azole -lifestyle/diet recs  3. RUQ pain - Ambulatory referral to General Surgery - Hepatic function panel  4. Gallstones - Ambulatory referral to General Surgery  5. Essential hypertension -continue current tx - Basic metabolic panel - CBC  6. Type 2 diabetes mellitus without complication, without long-term current use of insulin (HCC) -lifestyle recs, labs - Hemoglobin A1c  7. Hyperlipidemia, unspecified hyperlipidemia type -lifestyle recs, continue statin - Lipid panel  8. Cervical cancer screening - PAP [[place]]  9. Obesity -advised healthy Mediterranean low sugar diet and regular aerobic exercise  10: Anxiety -stable -continue celeax   Patient advised to return to clinic immediately if symptoms worsen or persist or new concerns.  Patient Instructions  BEFORE YOU LEAVE: -Wendie Simmer, please call GI office for cologuard date/results to update HM -labs -follow up: 3-4 months  -We placed a referral for you as discussed to the general surgeon about your right upper quadrant abdominal pain and gallstones. It usually takes about 1-2 weeks to process and schedule this referral. If you have not heard from [place] regarding this appointment in 2 weeks please contact our office.  -Call to schedule your diabetic eye exam and your mammogram.  -For the rash, take the Diflucan once today and once in 3 days.  Use topical clotrimazole cream or Monistat twice daily for about 2-3 weeks.  Eat a healthy low sugar diet.  Avoid sweets, processed foods and simple starches.  Follow-up promptly if this rash persists.  We have ordered labs or studies at this visit. It can take up to 1-2 weeks for results and processing. IF results require follow up or explanation, we will call you with instructions. Clinically stable results will be released to your [facility]. If you have not heard from  [place] or cannot find your results in [facility] in 2 weeks please contact our office at [phone].  If you are not yet signed up for [facility], please consider signing up.            No Follow-up on file.  [name], DO

## 2017-04-12 ENCOUNTER — Telehealth: Payer: Self-pay | Admitting: *Deleted

## 2017-04-12 ENCOUNTER — Encounter: Payer: Self-pay | Admitting: Family Medicine

## 2017-04-12 ENCOUNTER — Other Ambulatory Visit (HOSPITAL_COMMUNITY)
Admission: RE | Admit: 2017-04-12 | Discharge: 2017-04-12 | Disposition: A | Discharge status: Home / Self Care | Payer: BLUE CROSS/BLUE SHIELD | Source: Ambulatory Visit | Attending: Family Medicine | Admitting: Family Medicine

## 2017-04-12 ENCOUNTER — Ambulatory Visit (INDEPENDENT_AMBULATORY_CARE_PROVIDER_SITE_OTHER): Payer: BLUE CROSS/BLUE SHIELD | Admitting: Family Medicine

## 2017-04-12 VITALS — BP 100/60 | HR 89 | Temp 98.0°F | Ht 65.25 in | Wt 198.1 lb

## 2017-04-12 DIAGNOSIS — I10 Essential (primary) hypertension: Secondary | ICD-10-CM

## 2017-04-12 DIAGNOSIS — R945 Abnormal results of liver function studies: Secondary | ICD-10-CM | POA: Diagnosis not present

## 2017-04-12 DIAGNOSIS — E785 Hyperlipidemia, unspecified: Secondary | ICD-10-CM | POA: Diagnosis not present

## 2017-04-12 DIAGNOSIS — R1011 Right upper quadrant pain: Secondary | ICD-10-CM | POA: Diagnosis not present

## 2017-04-12 DIAGNOSIS — Z124 Encounter for screening for malignant neoplasm of cervix: Secondary | ICD-10-CM | POA: Insufficient documentation

## 2017-04-12 DIAGNOSIS — Z1151 Encounter for screening for human papillomavirus (HPV): Secondary | ICD-10-CM | POA: Insufficient documentation

## 2017-04-12 DIAGNOSIS — L304 Erythema intertrigo: Secondary | ICD-10-CM

## 2017-04-12 DIAGNOSIS — K8020 Calculus of gallbladder without cholecystitis without obstruction: Secondary | ICD-10-CM

## 2017-04-12 DIAGNOSIS — Z0000 Encounter for general adult medical examination without abnormal findings: Secondary | ICD-10-CM

## 2017-04-12 DIAGNOSIS — E119 Type 2 diabetes mellitus without complications: Secondary | ICD-10-CM

## 2017-04-12 DIAGNOSIS — R7989 Other specified abnormal findings of blood chemistry: Secondary | ICD-10-CM

## 2017-04-12 LAB — LIPID PANEL
CHOLESTEROL: 135 mg/dL (ref 0–200)
HDL: 39.8 mg/dL (ref >39.00)
LDL CALC: 58 mg/dL (ref 0–99)
NonHDL: 95.41
Total CHOL/HDL Ratio: 3
Triglycerides: 185 mg/dL — ABNORMAL HIGH (ref 0.0–149.0)
VLDL: 37 mg/dL (ref 0.0–40.0)

## 2017-04-12 LAB — CBC
HCT: 42.4 % (ref 36.0–46.0)
Hemoglobin: 13.8 g/dL (ref 12.0–15.0)
MCHC: 32.6 g/dL (ref 30.0–36.0)
MCV: 86.6 fl (ref 78.0–100.0)
Platelets: 230 10*3/uL (ref 150.0–400.0)
RBC: 4.89 Mil/uL (ref 3.87–5.11)
RDW: 13.7 % (ref 11.5–15.5)
WBC: 6.4 10*3/uL (ref 4.0–10.5)

## 2017-04-12 LAB — HEPATIC FUNCTION PANEL
ALBUMIN: 4.2 g/dL (ref 3.5–5.2)
ALT: 30 U/L (ref 0–35)
AST: 31 U/L (ref 0–37)
Alkaline Phosphatase: 144 U/L — ABNORMAL HIGH (ref 39–117)
BILIRUBIN TOTAL: 0.6 mg/dL (ref 0.2–1.2)
Bilirubin, Direct: 0.1 mg/dL (ref 0.0–0.3)
TOTAL PROTEIN: 7.1 g/dL (ref 6.0–8.3)

## 2017-04-12 LAB — BASIC METABOLIC PANEL
BUN: 20 mg/dL (ref 6–23)
CALCIUM: 9.8 mg/dL (ref 8.4–10.5)
CHLORIDE: 104 meq/L (ref 96–112)
CO2: 27 meq/L (ref 19–32)
Creatinine, Ser: 1.16 mg/dL (ref 0.40–1.20)
GFR: 50.04 mL/min — ABNORMAL LOW (ref >60.00)
Glucose, Bld: 126 mg/dL — ABNORMAL HIGH (ref 70–99)
Potassium: 3.8 mEq/L (ref 3.5–5.1)
Sodium: 140 mEq/L (ref 135–145)

## 2017-04-12 LAB — HEMOGLOBIN A1C: Hgb A1c MFr Bld: 6.7 % — ABNORMAL HIGH (ref 4.6–6.5)

## 2017-04-12 MED ORDER — CITALOPRAM HYDROBROMIDE 20 MG PO TABS: 20.0000 mg | ORAL_TABLET | Freq: Every day | ORAL | 3 refills | Status: DC

## 2017-04-12 MED ORDER — ATORVASTATIN CALCIUM 10 MG PO TABS: 10.0000 mg | ORAL_TABLET | Freq: Every day | ORAL | 3 refills | Status: DC

## 2017-04-12 MED ORDER — FLUCONAZOLE 150 MG PO TABS: 150.0000 mg | ORAL_TABLET | Freq: Once | ORAL | 0 refills | Status: AC

## 2017-04-12 MED ORDER — LOSARTAN POTASSIUM-HCTZ 50-12.5 MG PO TABS: ORAL_TABLET | ORAL | 3 refills | Status: DC

## 2017-04-12 NOTE — Patient Instructions (Signed)
BEFORE YOU LEAVE: -Wendie Simmer, please call GI office for cologuard date/results to update HM -labs -follow up: 3-4 months  -We placed a referral for you as discussed to the general surgeon about your right upper quadrant abdominal pain and gallstones. It usually takes about 1-2 weeks to process and schedule this referral. If you have not heard from [place] regarding this appointment in 2 weeks please contact our office.  -Call to schedule your diabetic eye exam and your mammogram.  -For the rash, take the Diflucan once today and once in 3 days.  Use topical clotrimazole cream or Monistat twice daily for about 2-3 weeks.  Eat a healthy low sugar diet.  Avoid sweets, processed foods and simple starches.  Follow-up promptly if this rash persists.  We have ordered labs or studies at this visit. It can take up to 1-2 weeks for results and processing. IF results require follow up or explanation, we will call you with instructions. Clinically stable results will be released to your [facility]. If you have not heard from [place] or cannot find your results in [facility] in 2 weeks please contact our office at [phone].  If you are not yet signed up for [facility], please consider signing up.

## 2017-04-12 NOTE — Telephone Encounter (Signed)
Per Dr Maudie Mercury I called the pt and informed her the FIT test was noted in her chart and this needs to be repeated once a year instead of every 3 years like the Cologuard. Patient stated she will check on this.

## 2017-04-13 LAB — CYTOLOGY - PAP
DIAGNOSIS: NEGATIVE
HPV: NOT DETECTED

## 2017-04-15 ENCOUNTER — Other Ambulatory Visit: Payer: Self-pay | Admitting: *Deleted

## 2017-04-15 ENCOUNTER — Telehealth: Payer: Self-pay | Admitting: Family Medicine

## 2017-04-15 ENCOUNTER — Encounter: Payer: Self-pay | Admitting: Family Medicine

## 2017-04-15 MED ORDER — HYDROXYZINE HCL 25 MG PO TABS: ORAL_TABLET | ORAL | 0 refills | Status: DC

## 2017-04-15 NOTE — Telephone Encounter (Signed)
Rx done and pt notified via Mychart message. 

## 2017-04-15 NOTE — Telephone Encounter (Signed)
See Mychart note 

## 2017-04-15 NOTE — Telephone Encounter (Signed)
Copied from [place] [id number]. Topic: General - Other >> Apr 15, 2017  2:38 PM Lolita Rieger, RMA wrote: Reason for CRM: pt would like something stronger for her itch can you please call her @ [phone]

## 2017-04-19 NOTE — Addendum Note (Signed)
Addended by: Agnes Lawrence on: 04/19/2017 09:20 AM   Modules accepted: Orders

## 2017-04-21 ENCOUNTER — Ambulatory Visit: Payer: BLUE CROSS/BLUE SHIELD | Admitting: Family Medicine

## 2017-04-21 ENCOUNTER — Encounter: Payer: Self-pay | Admitting: Family Medicine

## 2017-04-21 VITALS — BP 122/82 | HR 100 | Temp 98.1°F | Ht 65.25 in | Wt 198.0 lb

## 2017-04-21 DIAGNOSIS — R748 Abnormal levels of other serum enzymes: Secondary | ICD-10-CM

## 2017-04-21 DIAGNOSIS — R7989 Other specified abnormal findings of blood chemistry: Secondary | ICD-10-CM

## 2017-04-21 DIAGNOSIS — R945 Abnormal results of liver function studies: Secondary | ICD-10-CM | POA: Diagnosis not present

## 2017-04-21 DIAGNOSIS — R21 Rash and other nonspecific skin eruption: Secondary | ICD-10-CM

## 2017-04-21 DIAGNOSIS — K8020 Calculus of gallbladder without cholecystitis without obstruction: Secondary | ICD-10-CM | POA: Diagnosis not present

## 2017-04-21 LAB — HEPATIC FUNCTION PANEL
ALBUMIN: 4.1 g/dL (ref 3.5–5.2)
ALT: 28 U/L (ref 0–35)
AST: 29 U/L (ref 0–37)
Alkaline Phosphatase: 161 U/L — ABNORMAL HIGH (ref 39–117)
BILIRUBIN TOTAL: 0.5 mg/dL (ref 0.2–1.2)
Bilirubin, Direct: 0.1 mg/dL (ref 0.0–0.3)
Total Protein: 7 g/dL (ref 6.0–8.3)

## 2017-04-21 LAB — VITAMIN D 25 HYDROXY (VIT D DEFICIENCY, FRACTURES): VITD: 27.7 ng/mL — AB (ref 30.00–100.00)

## 2017-04-21 LAB — GAMMA GT: GGT: 167 U/L — ABNORMAL HIGH (ref 7–51)

## 2017-04-21 MED ORDER — PREDNISONE 20 MG PO TABS: 40.0000 mg | ORAL_TABLET | Freq: Every day | ORAL | 0 refills | Status: DC

## 2017-04-21 NOTE — Progress Notes (Signed)
HPI:  Acute visit for Rash: -for several days -had rash under breasts and grion last visit - treated with diflucan 2 pills - much better -but now rash everywhere that is very itchy  -tried a number of otc lotions and atarax which did not help -denies fevers, malaise, SOB, swelling, dysphagia -she can't think of any new exposures other then the diflucan -denies tick bites  Gallstones: -sent referral last visit -she wonders on referral as has not heard back -has RUQ pain with meals chronically and reports was told had "2 cm gallstone" "6 years ago" -denies fevers, vomiting, wt loss  Elevated Alk phos: -on recent labs, mild -reports long hx vit d def  ROS: See pertinent positives and negatives per HPI.  Past Medical History:  Diagnosis Date  . ANXIETY 10/18/2007   Qualifier: Diagnosis of  By: Sarajane Jews MD, Ishmael Holter   . Chronic low back pain   . Diabetes ([place]) 02/24/2016  . GERD 10/18/2007   Qualifier: Diagnosis of  By: Sherlynn Stalls, CMA, [place]    . Hyperlipemia 03/12/2014  . HYPERTENSION 10/18/2007   Qualifier: Diagnosis of  By: Sherlynn Stalls, CMA, [place]    . Insomnia 03/12/2014  . Irritable bowel syndrome 10/18/2007   Qualifier: Diagnosis of  By: Sarajane Jews MD, Ishmael Holter   . Recurrent knee pain    R knee hx meniscal tear 2002 with repair    Past Surgical History:  Procedure Laterality Date  . BREAST SURGERY     fibroid  . MENISCUS REPAIR Right   . TONSILLECTOMY      Family History  Problem Relation Age of Onset  . Cancer Mother        melanoma  . Hyperlipidemia Mother   . Breast cancer Mother   . Liver cancer Father   . Cancer Maternal Grandfather        unknown type  . Heart disease Paternal Grandfather   . Diabetes Maternal Aunt   . Diabetes Sister   . Lung disease Neg Hx   . Colon cancer Neg Hx     Social History   Socioeconomic History  . Marital status: Widowed    Spouse name: None  . Number of children: None  . Years of education: None  . Highest education level: None   Social Needs  . Financial resource strain: None  . Food insecurity - worry: None  . Food insecurity - inability: None  . Transportation needs - medical: None  . Transportation needs - non-medical: None  Occupational History  . None  Tobacco Use  . Smoking status: Never Smoker  . Smokeless tobacco: Never Used  Substance and Sexual Activity  . Alcohol use: Yes    Alcohol/week: 0.0 oz    Comment: 1 drink monthly  . Drug use: No  . Sexual activity: None  Other Topics Concern  . None  Social History Narrative   Work or School: Press photographer - [employer]       Home Situation: lives alone      Spiritual Beliefs: none      Lifestyle: no regular exercise; poor diet      [place] [facility]:   Originally from [place]. She has lived in [place], [place], [place], [place]. She has a dog currently. No bird exposure. No hot tub exposure. No mold exposure. Currently works as a Adult nurse.         Current Outpatient Medications:  .  atorvastatin (LIPITOR) 10 MG tablet, Take 1 tablet (10 mg total)  by mouth daily., Disp: 90 tablet, Rfl: 3 .  citalopram (CELEXA) 20 MG tablet, Take 1 tablet (20 mg total) by mouth daily., Disp: 90 tablet, Rfl: 3 .  diphenhydramine-acetaminophen (TYLENOL PM) 25-500 MG TABS, Take 1 tablet by mouth at bedtime as needed (sleep/pain). , Disp: , Rfl:  .  hydrOXYzine (ATARAX/VISTARIL) 25 MG tablet, Take 1-2 tablets every 8 hours as needed, Disp: 30 tablet, Rfl: 0 .  losartan-hydrochlorothiazide (HYZAAR) 50-12.5 MG tablet, TAKE 1/2 TABLET BY MOUTH EVERY DAY **PT NEEDS OFFICE VISIT, Disp: 90 tablet, Rfl: 3 .  montelukast (SINGULAIR) 10 MG tablet, TAKE 1 TABLET BY MOUTH AT BEDTIME, Disp: 90 tablet, Rfl: 1 .  pantoprazole (PROTONIX) 40 MG tablet, TAKE 1 TABLET BY MOUTH TWICE A DAY, Disp: 60 tablet, Rfl: 2 .  predniSONE (DELTASONE) 20 MG tablet, Take 2 tablets (40 mg total) by mouth daily with breakfast., Disp: 8 tablet, Rfl: 0 .  ranitidine (ZANTAC) 300 MG  tablet, Take 1 tablet (300 mg total) by mouth at bedtime., Disp: 90 tablet, Rfl: 1  EXAM:  Vitals:   04/21/17 1348  BP: 122/82  Pulse: 100  Temp: 98.1 F (36.7 C)    Body mass index is 32.7 kg/m.  GENERAL: vitals reviewed and listed above, alert, oriented, appears well hydrated and in no acute distress  HEENT: atraumatic, conjunttiva clear, no obvious abnormalities on inspection of external nose and ears  NECK: no obvious masses on inspection  LUNGS: clear to auscultation bilaterally, no wheezes, rales or rhonchi, good air movement  CV: HRRR, no peripheral edema  SKIN fine erythematous maculopapular rash with coalescence in flexor creases arms, neck, trunk, legs  MS: moves all extremities without noticeable abnormality  PSYCH: pleasant and cooperative, no obvious depression or anxiety  ASSESSMENT AND PLAN:  Discussed the following assessment and plan:  Rash -? Contact dermatitis, drug reaction vs other -opted to tx with prednisone as she reports "I need something" after discuss options and risks, pepcid and hypoallergenic skin regimen with good emollient -advised follow up promptly if worsening or not improving  Gallstones - Plan: Ambulatory referral to General Surgery -referral coordinator checked on referral and reports surgery office advised they will call her today  Elevated alkaline phosphatase level - Plan: Hepatic function panel, VITAMIN D 25 Hydroxy (Vit-D Deficiency, Fractures), Gamma GT  -Patient advised to return or notify a doctor immediately if symptoms worsen or persist or new concerns arise.  Patient Instructions  BEFORE YOU LEAVE: -labs -follow up 1 month  For the rash: -prednisone -pepcid instead of zantac and benadryl for 2 weeks -hypoallergenic soap, detergent, lotion -eucerin hypoallergenic skin cream or aquaphor for moisture I hope you are feeling better soon! Seek care promptly if your symptoms worsen, new concerns arise or you are not  improving with treatment.  -We placed a referral for you as discussed to general surgery for the gallbladder. It usually takes about 1-2 weeks to process and schedule this referral. If you have not heard from [place] regarding this appointment in 2 weeks please contact our office.        [name], DO

## 2017-04-21 NOTE — Patient Instructions (Addendum)
BEFORE YOU LEAVE: -labs -follow up 1 month  For the rash: -prednisone -pepcid instead of zantac and benadryl for 2 weeks -hypoallergenic soap, detergent, lotion -eucerin hypoallergenic skin cream or aquaphor for moisture I hope you are feeling better soon! Seek care promptly if your symptoms worsen, new concerns arise or you are not improving with treatment.  -We placed a referral for you as discussed to general surgery for the gallbladder. It usually takes about 1-2 weeks to process and schedule this referral. If you have not heard from [place] regarding this appointment in 2 weeks please contact our office.

## 2017-04-25 NOTE — Addendum Note (Signed)
Addended by: Agnes Lawrence on: 04/25/2017 04:02 PM   Modules accepted: Orders

## 2017-05-04 ENCOUNTER — Ambulatory Visit: Payer: Self-pay | Admitting: General Surgery

## 2017-05-04 NOTE — H&P (Signed)
History of Present Illness Ralene Ok MD; 05/04/2017 1:42 PM) The patient is a 62 year old female who presents for evaluation of gall stones. Referred by: Dr. Colin Benton Chief Complaint: Right upper quadrant abdominal pain  Patient is a 62 year old female with a approximately 3-4 week history of right upper quadrant abdominal pain. She states that the pain began after eating dinner. She states that at the same time she was moving was unsure the patient was secondary to muscular pain. Patient states the pain was very intense proceed to the ER for further evaluation. While waiting to be seen in the ER the pain didn't resolve the patient went Home secondary to wait.   Patient states that she previously underwent ultrasound approximately 2 years ago which revealed a large gallstone. [place] was [place]. Patient recently had LFTs. Patient had an elevated alkaline phosphatase of 161, GGT of 167. Patient's T bili was normal    Allergies (Danielle Gerrigner, CMA; 05/04/2017 1:28 PM) No Known Drug Allergies [05/04/2017]: Allergies Reconciled   Medication History Levonne Spiller, CMA; 05/04/2017 1:29 PM) Atorvastatin Calcium (10MG  Tablet, Oral) Active. Citalopram Hydrobromide (20MG  Tablet, Oral) Active. Losartan Potassium-HCTZ (100-25MG  Tablet, Oral) Active. Pantoprazole Sodium (Oral) Specific strength unknown - Active. Medications Reconciled    Review of Systems Ralene Ok, MD; 05/04/2017 1:49 PM) General Present- Feeling well. Not Present- Fever. Respiratory Not Present- Cough and Difficulty Breathing. Cardiovascular Not Present- Chest Pain. Gastrointestinal Present- Abdominal Pain and Nausea. Musculoskeletal Not Present- Myalgia. Neurological Not Present- Weakness. All other systems negative  Vitals Andee Poles Gerrigner CMA; 05/04/2017 1:29 PM) 05/04/2017 1:29 PM Weight: 191.38 lb Height: 65.5in Body Surface Area: 1.95 m Body Mass Index: 31.36 kg/m   Temp.: 97.76F(Oral)  Pulse: 113 (Regular)  BP: 112/84 (Sitting, Left Arm, Standard)       Physical Exam Ralene Ok MD; 05/04/2017 1:48 PM) The physical exam findings are as follows: Note:Constitutional: No acute distress, conversant, appears stated age  Eyes: Anicteric sclerae, moist conjunctiva, no lid lag  Neck: No thyromegaly, trachea midline, no cervical lymphadenopathy  Lungs: Clear to auscultation biilaterally, normal respiratory effot  Cardiovascular: regular rate & rhythm, no murmurs, no peripheal edema, pedal pulses 2+  GI: Soft, no masses or hepatosplenomegaly, non-tender to palpation  MSK: Normal gait, no clubbing cyanosis, edema  Skin: No rashes, palpation reveals normal skin turgor  Psychiatric: Appropriate judgment and insight, oriented to person, place, and time    Assessment & Plan Ralene Ok MD; 05/04/2017 1:49 PM) SYMPTOMATIC CHOLELITHIASIS (K80.20) Impression: Patient is a 62 year old female with symptomatic cholelithiasis  1. We will proceed to the operating room for a laparoscopic cholecystectomy  2. Risks and benefits were discussed with the patient to generally include, but not limited to: infection, bleeding, possible need for post op ERCP, damage to the bile ducts, bile leak, and possible need for further surgery. Alternatives were offered and described. All questions were answered and the patient voiced understanding of the procedure and wishes to proceed at this point with a laparoscopic cholecystectomy

## 2017-05-05 ENCOUNTER — Ambulatory Visit
Admission: RE | Admit: 2017-05-05 | Discharge: 2017-05-05 | Disposition: A | Discharge status: Home / Self Care | Payer: BLUE CROSS/BLUE SHIELD | Source: Ambulatory Visit | Attending: Family Medicine | Admitting: Family Medicine

## 2017-05-05 DIAGNOSIS — R748 Abnormal levels of other serum enzymes: Secondary | ICD-10-CM

## 2017-05-06 ENCOUNTER — Other Ambulatory Visit: Payer: Self-pay | Admitting: *Deleted

## 2017-05-06 DIAGNOSIS — K760 Fatty (change of) liver, not elsewhere classified: Secondary | ICD-10-CM

## 2017-05-16 ENCOUNTER — Other Ambulatory Visit: Payer: Self-pay | Admitting: General Surgery

## 2017-07-05 ENCOUNTER — Encounter: Payer: Self-pay | Admitting: Family Medicine

## 2017-07-05 MED ORDER — HYDROCHLOROTHIAZIDE 12.5 MG PO TABS: ORAL_TABLET | ORAL | 0 refills | Status: DC

## 2017-07-05 MED ORDER — LOSARTAN POTASSIUM 50 MG PO TABS: ORAL_TABLET | ORAL | 0 refills | Status: DC

## 2017-07-05 NOTE — Telephone Encounter (Signed)
I called the pt and informed her of the message below.  Patient stated she has called a few pharmacies and none of them have the Rx.  Rx for the 2 separate medications were sent to the pts pharmacy and she is aware of this.  She also stated her blood pressure has been high at home and I offered to schedule an appt as she is overdue.  Patient stated she will call back for an appt as she has had a lot going on and does not have time right now.

## 2017-07-05 NOTE — Telephone Encounter (Signed)
Please call pt. This is still available at most pharmacies as not all manufactures or batches were recalled. Advise can send rx for each individual component at the same dose or she can check with the pharmacy if they have a product that was not impacted for her same medication. Let use know and we can send. If wants to change medication altogether would need appointment.

## 2017-07-26 ENCOUNTER — Encounter: Payer: Self-pay | Admitting: Family Medicine

## 2017-07-28 ENCOUNTER — Encounter: Payer: Self-pay | Admitting: Family Medicine

## 2017-07-28 ENCOUNTER — Ambulatory Visit: Payer: BLUE CROSS/BLUE SHIELD | Admitting: Family Medicine

## 2017-07-28 DIAGNOSIS — Z0289 Encounter for other administrative examinations: Secondary | ICD-10-CM

## 2017-07-29 ENCOUNTER — Other Ambulatory Visit: Payer: Self-pay | Admitting: Family Medicine

## 2017-07-29 MED ORDER — LOSARTAN POTASSIUM 50 MG PO TABS: ORAL_TABLET | ORAL | 0 refills | Status: DC

## 2017-07-29 MED ORDER — HYDROCHLOROTHIAZIDE 12.5 MG PO TABS: ORAL_TABLET | ORAL | 0 refills | Status: DC

## 2017-08-01 ENCOUNTER — Telehealth: Payer: Self-pay | Admitting: Family Medicine

## 2017-08-01 NOTE — Telephone Encounter (Signed)
Copied from [place] [id number]. Topic: Quick Communication - See Telephone Encounter >> Aug 01, 2017  4:08 PM Ether Griffins B wrote: CRM for notification. See Telephone encounter for: 08/01/17. Pt is at pharmacy and they are stating they do not have her medication. Pt is leaving town Wednesday morning. hydrochlorothiazide (HYDRODIURIL) 12.5 MG tablet,    losartan (COZAAR) 50 MG tablet.

## 2017-08-01 NOTE — Telephone Encounter (Signed)
I left a detailed message at the pts cell number stating both Rx were sent to [facility] in [place] on 5/17.

## 2017-08-24 ENCOUNTER — Other Ambulatory Visit: Payer: Self-pay | Admitting: Family Medicine

## 2017-09-13 ENCOUNTER — Encounter: Payer: Self-pay | Admitting: Family Medicine

## 2017-09-13 ENCOUNTER — Ambulatory Visit (INDEPENDENT_AMBULATORY_CARE_PROVIDER_SITE_OTHER): Payer: BLUE CROSS/BLUE SHIELD | Admitting: Family Medicine

## 2017-09-13 VITALS — BP 100/80 | HR 84 | Temp 98.2°F | Wt 191.0 lb

## 2017-09-13 DIAGNOSIS — R740 Nonspecific elevation of levels of transaminase and lactic acid dehydrogenase [LDH]: Secondary | ICD-10-CM

## 2017-09-13 DIAGNOSIS — R7401 Elevation of levels of liver transaminase levels: Secondary | ICD-10-CM

## 2017-09-13 DIAGNOSIS — F411 Generalized anxiety disorder: Secondary | ICD-10-CM

## 2017-09-13 DIAGNOSIS — E669 Obesity, unspecified: Secondary | ICD-10-CM

## 2017-09-13 DIAGNOSIS — I10 Essential (primary) hypertension: Secondary | ICD-10-CM

## 2017-09-13 DIAGNOSIS — E119 Type 2 diabetes mellitus without complications: Secondary | ICD-10-CM

## 2017-09-13 DIAGNOSIS — E785 Hyperlipidemia, unspecified: Secondary | ICD-10-CM

## 2017-09-13 NOTE — Patient Instructions (Addendum)
BEFORE YOU LEAVE: -labs -follow up: 3 months   We have ordered labs or studies at this visit. It can take up to 1-2 weeks for results and processing. IF results require follow up or explanation, we will call you with instructions. Clinically stable results will be released to your [facility]. If you have not heard from [place] or cannot find your results in [facility] in 2 weeks please contact our office at [phone].  If you are not yet signed up for [facility], please consider signing up.   We recommend the following healthy lifestyle for LIFE: 1) Small portions. But, make sure to get regular (at least 3 per day), healthy meals and small healthy snacks if needed.  2) Eat a healthy clean diet.   TRY TO EAT: -at least 5-7 servings of low sugar, colorful, and nutrient rich vegetables per day (not corn, potatoes or bananas.) -berries are the best choice if you wish to eat fruit (only eat small amounts if trying to reduce weight)  -lean meets (fish, white meat of chicken or [place]) -vegan proteins for some meals - beans or tofu, whole grains, nuts and seeds -Replace bad fats with good fats - good fats include: fish, nuts and seeds, canola oil, olive oil -small amounts of low fat or non fat dairy -small amounts of100 % whole grains - check the lables -drink plenty of water  AVOID: -SUGAR, sweets, anything with added sugar, corn syrup or sweeteners - must read labels as even foods advertised as "healthy" often are loaded with sugar -if you must have a sweetener, small amounts of stevia may be best -sweetened beverages and artificially sweetened beverages -simple starches (rice, bread, potatoes, pasta, chips, etc - small amounts of 100% whole grains are ok) -red meat, pork, butter -fried foods, fast food, processed food, excessive dairy, eggs and coconut.  3)Get at least 150 minutes of sweaty aerobic exercise per week - when able!  4)Reduce stress - consider counseling, meditation and relaxation to  balance other aspects of your life.

## 2017-09-13 NOTE — Progress Notes (Signed)
HPI:  Using dictation device. Unfortunately this device frequently misinterprets words/phrases.  Karen Hall is a pleasant 62 y.o. here for follow up. Chronic medical problems summarized below were reviewed for changes and stability and were updated as needed below. These issues and their treatment remain stable for the most part. trying to improved diet. Trying to lose wt. she had gallbladder surgery this year and then has been dealing with foot issues ever saw the gastroenterologist, but does not have any symptoms since her gallbladder surgery.  Mood stable, though under a lot of stress.  Niece has been sick.  Denies CP, SOB, DOE, treatment intolerance or new symptoms.  HTN: -meds:hctz, losartan -stable  HLD/Obesity: -working on [employer], not currently able ot exercise 2ndary to foot issues -meds: lipitor  DM: -diet controlled -creeping up last visit and diet changes advised  Elevated alk phos/ggt: -referred to GI 04/2017 -she did not go as had GB surgery and then dealing with foot issues - agrees to call -denies any further abd pain, unexplained wt loss, malaise, bowel changes, NV  GAD: -takes celexa long term  Foot pain/hx foot fx: -seeing orthopedic specialist   ROS: See pertinent positives and negatives per HPI.  Past Medical History:  Diagnosis Date  . ANXIETY 10/18/2007   Qualifier: Diagnosis of  By: Sarajane Jews MD, Ishmael Holter   . Chronic low back pain   . Diabetes ([place]) 02/24/2016  . GERD 10/18/2007   Qualifier: Diagnosis of  By: Sherlynn Stalls, CMA, [place]    . Hyperlipemia 03/12/2014  . HYPERTENSION 10/18/2007   Qualifier: Diagnosis of  By: Sherlynn Stalls, CMA, [place]    . Insomnia 03/12/2014  . Irritable bowel syndrome 10/18/2007   Qualifier: Diagnosis of  By: Sarajane Jews MD, Ishmael Holter   . Recurrent knee pain    R knee hx meniscal tear 2002 with repair    Past Surgical History:  Procedure Laterality Date  . BREAST SURGERY     fibroid  . MENISCUS REPAIR Right   . TONSILLECTOMY       Family History  Problem Relation Age of Onset  . Cancer Mother        melanoma  . Hyperlipidemia Mother   . Breast cancer Mother   . Liver cancer Father   . Cancer Maternal Grandfather        unknown type  . Heart disease Paternal Grandfather   . Diabetes Maternal Aunt   . Diabetes Sister   . Lung disease Neg Hx   . Colon cancer Neg Hx     SOCIAL HX: See HPI   Current Outpatient Medications:  .  atorvastatin (LIPITOR) 10 MG tablet, Take 1 tablet (10 mg total) by mouth daily., Disp: 90 tablet, Rfl: 3 .  Cholecalciferol (VITAMIN D3 PO), Take 1,000 Units by mouth 2 (two) times daily., Disp: , Rfl:  .  citalopram (CELEXA) 20 MG tablet, Take 1 tablet (20 mg total) by mouth daily., Disp: 90 tablet, Rfl: 3 .  Homeopathic Products (ZICAM ALLERGY RELIEF NA), Place into the nose as needed., Disp: , Rfl:  .  hydrochlorothiazide (HYDRODIURIL) 12.5 MG tablet, TAKE 1/2 TABLET DAILY, Disp: 30 tablet, Rfl: 0 .  Ibuprofen-Famotidine 800-26.6 MG TABS, Take by mouth 2 (two) times daily as needed., Disp: , Rfl:  .  losartan (COZAAR) 50 MG tablet, Take 0.5 tablet by mouth daily, Disp: 30 tablet, Rfl: 0 .  MELATONIN PO, Take by mouth as needed., Disp: , Rfl:  .  Omeprazole 20 MG TBDD, as needed. ,  Disp: , Rfl:   EXAM:  Vitals:   09/13/17 1550  BP: 100/80  Pulse: 84  Temp: 98.2 F (36.8 C)    Body mass index is 31.54 kg/m.  GENERAL: vitals reviewed and listed above, alert, oriented, appears well hydrated and in no acute distress  HEENT: atraumatic, conjunttiva clear, no obvious abnormalities on inspection of external nose and ears  NECK: no obvious masses on inspection  LUNGS: clear to auscultation bilaterally, no wheezes, rales or rhonchi, good air movement  CV: HRRR, no peripheral edema  MS: moves all extremities without noticeable abnormality  PSYCH: pleasant and cooperative, no obvious depression or anxiety  ASSESSMENT AND PLAN:  Discussed the following assessment and  plan:  Essential hypertension - Plan: CBC, Comprehensive metabolic panel  Hyperlipidemia, unspecified hyperlipidemia type  Type 2 diabetes mellitus without complication, without long-term current use of insulin (HCC) - Plan: Hemoglobin A1c  Obesity (BMI 30-39.9)  Anxiety state  Transaminitis - Plan: Comprehensive metabolic panel  -labs per orders -congratulated on lifestyle changes and encouraged to continue with emphasis on diet right now while dealing with foot issues -advised she call GI for eval if liver enzymes remain abnormal -BP/anxiety stable -Patient advised to return or notify a doctor immediately if symptoms worsen or persist or new concerns arise.  Patient Instructions  BEFORE YOU LEAVE: -labs -follow up: 3 months   We have ordered labs or studies at this visit. It can take up to 1-2 weeks for results and processing. IF results require follow up or explanation, we will call you with instructions. Clinically stable results will be released to your [facility]. If you have not heard from [place] or cannot find your results in [facility] in 2 weeks please contact our office at [phone].  If you are not yet signed up for [facility], please consider signing up.   We recommend the following healthy lifestyle for LIFE: 1) Small portions. But, make sure to get regular (at least 3 per day), healthy meals and small healthy snacks if needed.  2) Eat a healthy clean diet.   TRY TO EAT: -at least 5-7 servings of low sugar, colorful, and nutrient rich vegetables per day (not corn, potatoes or bananas.) -berries are the best choice if you wish to eat fruit (only eat small amounts if trying to reduce weight)  -lean meets (fish, white meat of chicken or [place]) -vegan proteins for some meals - beans or tofu, whole grains, nuts and seeds -Replace bad fats with good fats - good fats include: fish, nuts and seeds, canola oil, olive oil -small amounts of low fat or non fat dairy -small amounts  of100 % whole grains - check the lables -drink plenty of water  AVOID: -SUGAR, sweets, anything with added sugar, corn syrup or sweeteners - must read labels as even foods advertised as "healthy" often are loaded with sugar -if you must have a sweetener, small amounts of stevia may be best -sweetened beverages and artificially sweetened beverages -simple starches (rice, bread, potatoes, pasta, chips, etc - small amounts of 100% whole grains are ok) -red meat, pork, butter -fried foods, fast food, processed food, excessive dairy, eggs and coconut.  3)Get at least 150 minutes of sweaty aerobic exercise per week - when able!  4)Reduce stress - consider counseling, meditation and relaxation to balance other aspects of your life.          [name], DO

## 2017-09-14 ENCOUNTER — Encounter: Payer: Self-pay | Admitting: Podiatry

## 2017-09-14 ENCOUNTER — Ambulatory Visit: Payer: PRIVATE HEALTH INSURANCE | Admitting: Podiatry

## 2017-09-14 ENCOUNTER — Ambulatory Visit (INDEPENDENT_AMBULATORY_CARE_PROVIDER_SITE_OTHER): Payer: BLUE CROSS/BLUE SHIELD

## 2017-09-14 VITALS — BP 155/109 | HR 72

## 2017-09-14 DIAGNOSIS — M778 Other enthesopathies, not elsewhere classified: Secondary | ICD-10-CM

## 2017-09-14 DIAGNOSIS — M779 Enthesopathy, unspecified: Secondary | ICD-10-CM | POA: Diagnosis not present

## 2017-09-14 DIAGNOSIS — M722 Plantar fascial fibromatosis: Secondary | ICD-10-CM

## 2017-09-14 LAB — COMPREHENSIVE METABOLIC PANEL
ALT: 38 U/L — ABNORMAL HIGH (ref 0–35)
AST: 36 U/L (ref 0–37)
Albumin: 4 g/dL (ref 3.5–5.2)
Alkaline Phosphatase: 170 U/L — ABNORMAL HIGH (ref 39–117)
BUN: 17 mg/dL (ref 6–23)
CALCIUM: 9.4 mg/dL (ref 8.4–10.5)
CO2: 26 meq/L (ref 19–32)
Chloride: 107 mEq/L (ref 96–112)
Creatinine, Ser: 0.9 mg/dL (ref 0.40–1.20)
GFR: 66.98 mL/min (ref >60.00)
GLUCOSE: 126 mg/dL — AB (ref 70–99)
POTASSIUM: 3.6 meq/L (ref 3.5–5.1)
Sodium: 140 mEq/L (ref 135–145)
Total Bilirubin: 0.5 mg/dL (ref 0.2–1.2)
Total Protein: 7 g/dL (ref 6.0–8.3)

## 2017-09-14 LAB — CBC
HEMATOCRIT: 40.6 % (ref 36.0–46.0)
HEMOGLOBIN: 13.3 g/dL (ref 12.0–15.0)
MCHC: 32.7 g/dL (ref 30.0–36.0)
MCV: 85.4 fl (ref 78.0–100.0)
Platelets: 238 10*3/uL (ref 150.0–400.0)
RBC: 4.76 Mil/uL (ref 3.87–5.11)
RDW: 13.9 % (ref 11.5–15.5)
WBC: 7.4 10*3/uL (ref 4.0–10.5)

## 2017-09-14 LAB — HEMOGLOBIN A1C: HEMOGLOBIN A1C: 6.4 % (ref 4.6–6.5)

## 2017-09-14 NOTE — Patient Instructions (Signed)
Pre-Operative Instructions  Congratulations, you have decided to take an important step towards improving your quality of life.  You can be assured that the doctors and staff at [facility] will be with you every step of the way.  Here are some important things you should know:  1. Plan to be at the surgery center/hospital at least 1 (one) hour prior to your scheduled time, unless otherwise directed by the surgical center/hospital staff.  You must have a responsible adult accompany you, remain during the surgery and drive you home.  Make sure you have directions to the surgical center/hospital to ensure you arrive on time. 2. If you are having surgery at [facility] or [facility] hospitals, you will need a copy of your medical history and physical form from your family physician within one month prior to the date of surgery. We will give you a form for your primary physician to complete.  3. We make every effort to accommodate the date you request for surgery.  However, there are times where surgery dates or times have to be moved.  We will contact you as soon as possible if a change in schedule is required.   4. No aspirin/ibuprofen for one week before surgery.  If you are on aspirin, any non-steroidal anti-inflammatory medications (Mobic, Aleve, Ibuprofen) should not be taken seven (7) days prior to your surgery.  You make take Tylenol for pain prior to surgery.  5. Medications - If you are taking daily heart and blood pressure medications, seizure, reflux, allergy, asthma, anxiety, pain or diabetes medications, make sure you notify the surgery center/hospital before the day of surgery so they can tell you which medications you should take or avoid the day of surgery. 6. No food or drink after midnight the night before surgery unless directed otherwise by surgical center/hospital staff. 7. No alcoholic beverages 24-hours prior to surgery.  No smoking 24-hours prior or 24-hours after  surgery. 8. Wear loose pants or shorts. They should be loose enough to fit over bandages, boots, and casts. 9. Don't wear slip-on shoes. Sneakers are preferred. 10. Bring your boot with you to the surgery center/hospital.  Also bring crutches or a walker if your physician has prescribed it for you.  If you do not have this equipment, it will be provided for you after surgery. 11. If you have not been contacted by the surgery center/hospital by the day before your surgery, call to confirm the date and time of your surgery. 12. Leave-time from work may vary depending on the type of surgery you have.  Appropriate arrangements should be made prior to surgery with your employer. 13. Prescriptions will be provided immediately following surgery by your doctor.  Fill these as soon as possible after surgery and take the medication as directed. Pain medications will not be refilled on weekends and must be approved by the doctor. 14. Remove nail polish on the operative foot and avoid getting pedicures prior to surgery. 15. Wash the night before surgery.  The night before surgery wash the foot and leg well with water and the antibacterial soap provided. Be sure to pay special attention to beneath the toenails and in between the toes.  Wash for at least three (3) minutes. Rinse thoroughly with water and dry well with a towel.  Perform this wash unless told not to do so by your physician.  Enclosed: 1 Ice pack (please put in freezer the night before surgery)   1 Hibiclens skin cleaner     Pre-op instructions  If you have any questions regarding the instructions, please do not hesitate to call our office.  [place]: [address], [place], [place] [address] -- [phone]  [place]: [address] [place], [place] [address] -- [phone]  [place]: [address]  , [place] [address] -- [phone]  [place]: [address], [address], [place], [place] [address] -- [phone]  Website:  [email or web address] 

## 2017-09-21 NOTE — Progress Notes (Signed)
   Subjective: 62 year old female presenting today as a new patient referred by Dr. Gershon Mussel with a chief complaint of plantar fasciitis of the right foot that flared up two months ago. She states she has been wearing a CAM boot intermittently for the past two months secondary to a fractured heel. Patient is here for further evaluation and treatment.   Past Medical History:  Diagnosis Date  . ANXIETY 10/18/2007   Qualifier: Diagnosis of  By: Sarajane Jews MD, Ishmael Holter   . Chronic low back pain   . Diabetes ([place]) 02/24/2016  . GERD 10/18/2007   Qualifier: Diagnosis of  By: Sherlynn Stalls, CMA, [place]    . Hyperlipemia 03/12/2014  . HYPERTENSION 10/18/2007   Qualifier: Diagnosis of  By: Sherlynn Stalls, CMA, [place]    . Insomnia 03/12/2014  . Irritable bowel syndrome 10/18/2007   Qualifier: Diagnosis of  By: Sarajane Jews MD, Ishmael Holter   . Recurrent knee pain    R knee hx meniscal tear 2002 with repair     Objective: Physical Exam General: The patient is alert and oriented x3 in no acute distress.  Dermatology: Skin is warm, dry and supple bilateral lower extremities. Negative for open lesions or macerations bilateral.   Vascular: Dorsalis Pedis and Posterior Tibial pulses palpable bilateral.  Capillary fill time is immediate to all digits.  Neurological: Epicritic and protective threshold intact bilateral.   Musculoskeletal: Tenderness to palpation to the plantar aspect of the right heel along the plantar fascia. All other joints range of motion within normal limits bilateral. Strength 5/5 in all groups bilateral.   Radiographic exam: Normal osseous mineralization. Joint spaces preserved. No fracture/dislocation/boney destruction. No other soft tissue abnormalities or radiopaque foreign bodies.   Assessment: 1. Plantar fasciitis right - chronic  2. Pain in right foot  Plan of Care:  1. Patient evaluated. Xrays reviewed.   2. Today we discussed the conservative versus surgical management of the presenting pathology. The  patient opts for surgical management. All possible complications and details of the procedure were explained. All patient questions were answered. No guarantees were expressed or implied. 3. Authorization for surgery was initiated today. Surgery will consist of EPF of the right foot.  4. Return to clinic one week post op.    Edrick Kins, DPM [facility]  Dr. Edrick Kins, DPM    [address], [place] [address]                Office [phone]  Fax [phone]

## 2017-10-06 ENCOUNTER — Other Ambulatory Visit: Payer: Self-pay | Admitting: Family Medicine

## 2017-10-06 ENCOUNTER — Encounter: Payer: Self-pay | Admitting: Podiatry

## 2017-10-06 DIAGNOSIS — M722 Plantar fascial fibromatosis: Secondary | ICD-10-CM

## 2017-10-07 ENCOUNTER — Telehealth: Payer: Self-pay | Admitting: Podiatry

## 2017-10-07 NOTE — Telephone Encounter (Signed)
Pt had surgery and pain medication is not working. She wants to change to something that will help with the pain. Please give patient a call back

## 2017-10-07 NOTE — Telephone Encounter (Signed)
Per Dr. Amalia Hailey verbal order, patient was contacted and instructed to double up on her Dilaudid for the next two doses to see if that will help with her pain.  She verbalized instructions and will call if her pain continues to be uncontrolled.

## 2017-10-12 ENCOUNTER — Ambulatory Visit (INDEPENDENT_AMBULATORY_CARE_PROVIDER_SITE_OTHER): Payer: BLUE CROSS/BLUE SHIELD | Admitting: Podiatry

## 2017-10-12 ENCOUNTER — Ambulatory Visit: Payer: Self-pay

## 2017-10-12 DIAGNOSIS — M722 Plantar fascial fibromatosis: Secondary | ICD-10-CM

## 2017-10-12 DIAGNOSIS — Z9889 Other specified postprocedural states: Secondary | ICD-10-CM

## 2017-10-18 NOTE — Progress Notes (Signed)
   Subjective:  Patient presents today status post EPF right. DOS: 10/06/17. She states she is doing very well. She reports some mild pain on the lateral aspect of the foot but believes it may be from wearing the CAM boot. Patient is here for further evaluation and treatment.   Past Medical History:  Diagnosis Date  . ANXIETY 10/18/2007   Qualifier: Diagnosis of  By: Sarajane Jews MD, Ishmael Holter   . Chronic low back pain   . Diabetes ([place]) 02/24/2016  . GERD 10/18/2007   Qualifier: Diagnosis of  By: Sherlynn Stalls, CMA, [place]    . Hyperlipemia 03/12/2014  . HYPERTENSION 10/18/2007   Qualifier: Diagnosis of  By: Sherlynn Stalls, CMA, [place]    . Insomnia 03/12/2014  . Irritable bowel syndrome 10/18/2007   Qualifier: Diagnosis of  By: Sarajane Jews MD, Ishmael Holter   . Recurrent knee pain    R knee hx meniscal tear 2002 with repair      Objective/Physical Exam Neurovascular status intact.  Skin incisions appear to be well coapted with sutures and staples intact. No sign of infectious process noted. No dehiscence. No active bleeding noted. Moderate edema noted to the surgical extremity.  Assessment: 1. s/p EPF right. DOS: 10/06/17   Plan of Care:  1. Patient was evaluated.  2. Dressing changed.  3. Continue weightbearing in post op shoe. Discontinue CAM boot.  4. Return to clinic in one week for suture removal.    Edrick Kins, DPM [facility]  Dr. Edrick Kins, [address]                                        [place], [place] [address]                Office [phone]  Fax [phone]

## 2017-10-19 ENCOUNTER — Ambulatory Visit (INDEPENDENT_AMBULATORY_CARE_PROVIDER_SITE_OTHER): Payer: BLUE CROSS/BLUE SHIELD | Admitting: Podiatry

## 2017-10-19 DIAGNOSIS — Z9889 Other specified postprocedural states: Secondary | ICD-10-CM

## 2017-10-19 DIAGNOSIS — M722 Plantar fascial fibromatosis: Secondary | ICD-10-CM

## 2017-10-23 NOTE — Progress Notes (Signed)
   Subjective:  Patient presents today status post EPF right. DOS: 10/06/17. She reports some continued aching of the foot. She has been using the CAM boot as directed. She denies modifying factors. Patient is here for further evaluation and treatment.   Past Medical History:  Diagnosis Date  . ANXIETY 10/18/2007   Qualifier: Diagnosis of  By: Sarajane Jews MD, Ishmael Holter   . Chronic low back pain   . Diabetes ([place]) 02/24/2016  . GERD 10/18/2007   Qualifier: Diagnosis of  By: Sherlynn Stalls, CMA, [place]    . Hyperlipemia 03/12/2014  . HYPERTENSION 10/18/2007   Qualifier: Diagnosis of  By: Sherlynn Stalls, CMA, [place]    . Insomnia 03/12/2014  . Irritable bowel syndrome 10/18/2007   Qualifier: Diagnosis of  By: Sarajane Jews MD, Ishmael Holter   . Recurrent knee pain    R knee hx meniscal tear 2002 with repair      Objective/Physical Exam Neurovascular status intact.  Skin incisions appear to be well coapted with sutures and staples intact. No sign of infectious process noted. No dehiscence. No active bleeding noted. Moderate edema noted to the surgical extremity.  Assessment: 1. s/p EPF right. DOS: 10/06/17   Plan of Care:  1. Patient was evaluated.  2. Sutures removed.  3. Transition into normal shoe gear.  4. Return to clinic in 2 weeks.    Edrick Kins, DPM [facility]  Dr. Edrick Kins, [address]                                        [place], [place] [address]                Office [phone]  Fax [phone]

## 2017-11-02 ENCOUNTER — Ambulatory Visit (INDEPENDENT_AMBULATORY_CARE_PROVIDER_SITE_OTHER): Payer: BLUE CROSS/BLUE SHIELD | Admitting: Podiatry

## 2017-11-02 DIAGNOSIS — M722 Plantar fascial fibromatosis: Secondary | ICD-10-CM

## 2017-11-02 DIAGNOSIS — Z9889 Other specified postprocedural states: Secondary | ICD-10-CM

## 2017-11-06 NOTE — Progress Notes (Signed)
   Subjective:  Patient presents today status post EPF right. DOS: 10/06/17. She states she is doing very well. She denies any pain or new complaints at this time. Patient is here for further evaluation and treatment.   Past Medical History:  Diagnosis Date  . ANXIETY 10/18/2007   Qualifier: Diagnosis of  By: Sarajane Jews MD, Ishmael Holter   . Chronic low back pain   . Diabetes ([place]) 02/24/2016  . GERD 10/18/2007   Qualifier: Diagnosis of  By: Sherlynn Stalls, CMA, [place]    . Hyperlipemia 03/12/2014  . HYPERTENSION 10/18/2007   Qualifier: Diagnosis of  By: Sherlynn Stalls, CMA, [place]    . Insomnia 03/12/2014  . Irritable bowel syndrome 10/18/2007   Qualifier: Diagnosis of  By: Sarajane Jews MD, Ishmael Holter   . Recurrent knee pain    R knee hx meniscal tear 2002 with repair      Objective/Physical Exam Neurovascular status intact.  Skin incisions appear to be well coapted. No sign of infectious process noted. No dehiscence. No active bleeding noted. Moderate edema noted to the surgical extremity.  Assessment: 1. s/p EPF right. DOS: 10/06/17   Plan of Care:  1. Patient was evaluated.  2. May resume full activity with no restrictions.  3. Recommended good shoe gear.  4. Return to clinic as needed.    Edrick Kins, DPM [facility]  Dr. Edrick Kins, [address]                                        [place], [place] [address]                Office [phone]  Fax [phone]

## 2017-11-18 ENCOUNTER — Ambulatory Visit: Payer: BLUE CROSS/BLUE SHIELD | Admitting: Gastroenterology

## 2017-11-18 ENCOUNTER — Encounter

## 2017-11-30 NOTE — Progress Notes (Signed)
DOS: 10-06-2017 Endoscopic plantar Fasciotomy Right  [facility]

## 2017-12-19 ENCOUNTER — Encounter: Payer: Self-pay | Admitting: Family Medicine

## 2017-12-19 ENCOUNTER — Ambulatory Visit: Payer: BLUE CROSS/BLUE SHIELD | Admitting: Family Medicine

## 2017-12-19 VITALS — BP 112/80 | HR 84 | Temp 98.0°F | Ht 65.25 in | Wt 193.8 lb

## 2017-12-19 DIAGNOSIS — F411 Generalized anxiety disorder: Secondary | ICD-10-CM

## 2017-12-19 DIAGNOSIS — R740 Nonspecific elevation of levels of transaminase and lactic acid dehydrogenase [LDH]: Secondary | ICD-10-CM | POA: Diagnosis not present

## 2017-12-19 DIAGNOSIS — R7401 Elevation of levels of liver transaminase levels: Secondary | ICD-10-CM

## 2017-12-19 DIAGNOSIS — J410 Simple chronic bronchitis: Secondary | ICD-10-CM

## 2017-12-19 DIAGNOSIS — E1159 Type 2 diabetes mellitus with other circulatory complications: Secondary | ICD-10-CM

## 2017-12-19 DIAGNOSIS — J9600 Acute respiratory failure, unspecified whether with hypoxia or hypercapnia: Secondary | ICD-10-CM

## 2017-12-19 DIAGNOSIS — E1169 Type 2 diabetes mellitus with other specified complication: Secondary | ICD-10-CM

## 2017-12-19 DIAGNOSIS — I152 Hypertension secondary to endocrine disorders: Secondary | ICD-10-CM

## 2017-12-19 DIAGNOSIS — E785 Hyperlipidemia, unspecified: Secondary | ICD-10-CM

## 2017-12-19 DIAGNOSIS — E669 Obesity, unspecified: Secondary | ICD-10-CM | POA: Insufficient documentation

## 2017-12-19 DIAGNOSIS — I10 Essential (primary) hypertension: Secondary | ICD-10-CM

## 2017-12-19 MED ORDER — LOSARTAN POTASSIUM-HCTZ 50-12.5 MG PO TABS: 1.0000 | ORAL_TABLET | Freq: Every day | ORAL | 1 refills | Status: DC

## 2017-12-19 NOTE — Progress Notes (Signed)
HPI:  Using dictation device. Unfortunately this device frequently misinterprets words/phrases.  Karen Hall is a pleasant 62 y.o. here for follow up. Chronic medical problems summarized below were reviewed for changes and stability and were updated as needed below. These issues and their treatment remain stable for the most part.  Reports is feeling well.  However, she has been monitoring her blood pressure at home with a wrist cuff and it has been running a little high in the 130s to 140s over 80s to low 90.  She reports she was scheduled to see the gastroenterologist about her liver issues, however she got sick that morning and canceled the appointment.  She agrees to call to reschedule today.  She has not done her mammogram yet either, she agrees to call to schedule this as well.  She refuses all flu vaccines.  Reports she did her eye exam with Dr. Carolynn Sayers and it was normal. Denies CP, SOB, DOE, treatment intolerance or new symptoms.   HTN: -meds:hctz, losartan -stable  HLD/Obesity: -working on [employer], not currently able ot exercise 2ndary to foot issues -meds: lipitor  DM: -diet controlled -creeping up last visit and diet changes advised  Elevated alk phos/ggt: -referred to GI 04/2017, advised to see gi on several occassion -she did not go to see GI as had GB surgery and then dealing with foot issues - agreed to reschedule last visit -denies any further abd pain, unexplained wt loss, malaise, bowel changes, NV  GAD: -takes celexa long term  Foot pain/hx foot fx: -seeing orthopedic specialist   ROS: See pertinent positives and negatives per HPI.  Past Medical History:  Diagnosis Date  . ANXIETY 10/18/2007   Qualifier: Diagnosis of  By: Sarajane Jews MD, Ishmael Holter   . Chronic low back pain   . Diabetes ([place]) 02/24/2016  . GERD 10/18/2007   Qualifier: Diagnosis of  By: Sherlynn Stalls, CMA, [place]    . Hyperlipemia 03/12/2014  . HYPERTENSION 10/18/2007   Qualifier: Diagnosis of   By: Sherlynn Stalls, CMA, [place]    . Insomnia 03/12/2014  . Irritable bowel syndrome 10/18/2007   Qualifier: Diagnosis of  By: Sarajane Jews MD, Ishmael Holter   . Recurrent knee pain    R knee hx meniscal tear 2002 with repair    Past Surgical History:  Procedure Laterality Date  . BREAST SURGERY     fibroid  . MENISCUS REPAIR Right   . TONSILLECTOMY      Family History  Problem Relation Age of Onset  . Cancer Mother        melanoma  . Hyperlipidemia Mother   . Breast cancer Mother   . Liver cancer Father   . Cancer Maternal Grandfather        unknown type  . Heart disease Paternal Grandfather   . Diabetes Maternal Aunt   . Diabetes Sister   . Lung disease Neg Hx   . Colon cancer Neg Hx     SOCIAL HX: See HPI   Current Outpatient Medications:  .  atorvastatin (LIPITOR) 10 MG tablet, Take 1 tablet (10 mg total) by mouth daily., Disp: 90 tablet, Rfl: 3 .  Cholecalciferol (VITAMIN D3 PO), Take 1,000 Units by mouth 2 (two) times daily., Disp: , Rfl:  .  citalopram (CELEXA) 20 MG tablet, Take 1 tablet (20 mg total) by mouth daily., Disp: 90 tablet, Rfl: 3 .  Homeopathic Products (ZICAM ALLERGY RELIEF NA), Place into the nose as needed., Disp: , Rfl:  .  losartan-hydrochlorothiazide (HYZAAR) 50-12.5  MG tablet, Take 1 tablet by mouth daily., Disp: 90 tablet, Rfl: 1 .  MELATONIN PO, Take by mouth as needed., Disp: , Rfl:  .  Omeprazole 20 MG TBDD, as needed. , Disp: , Rfl:   EXAM:  Vitals:   12/19/17 1012  BP: 112/80  Pulse: 84  Temp: 98 F (36.7 C)    Body mass index is 32 kg/m.  GENERAL: vitals reviewed and listed above, alert, oriented, appears well hydrated and in no acute distress  HEENT: atraumatic, conjunttiva clear, no obvious abnormalities on inspection of external nose and ears  NECK: no obvious masses on inspection  LUNGS: clear to auscultation bilaterally, no wheezes, rales or rhonchi, good air movement  CV: HRRR, no peripheral edema  MS: moves all extremities without  noticeable abnormality  PSYCH: pleasant and cooperative, no obvious depression or anxiety  ASSESSMENT AND PLAN:  Discussed the following assessment and plan:  Type 2 diabetes mellitus with other specified complication, without long-term current use of insulin (HCC)  Hyperlipidemia associated with type 2 diabetes mellitus ([place])  Hypertension associated with diabetes ([place])  Transaminitis  Anxiety state  -Lifestyle recommendations, recommend healthy diet and regular exercise -Increase antihypertensive to 1 pill daily, recheck in 1 to 2 months with labs, recommended that she bring her cuff with her -Advise she needs to call her gastroenterologist today about an evaluation for the transaminitis, she agrees to call today -She also agrees to call to schedule her mammogram -She is seeing podiatry and also had her diabetic eye exam, recommended assistant to abstract -Did foot exam today -She refuses other health maintenance measures -Patient advised to return or notify a doctor immediately if symptoms worsen or persist or new concerns arise.  Patient Instructions  BEFORE YOU LEAVE: -abstract eye exam - Dr. Katy Fitch -follow up: 1-2 months, bring blood pressure cuff  Call your gastroenterologist today for an appointment about the liver issues.  Called the mammogram center today to schedule your mammogram.  Increase your blood pressure medication to 1 tablet daily.  I sent a refill to your pharmacy.  Eat a healthy low sugar diet and get regular exercise.  Continue current medications otherwise.   [name], DO

## 2017-12-19 NOTE — Patient Instructions (Signed)
BEFORE YOU LEAVE: -abstract eye exam - Dr. Katy Fitch -follow up: 1-2 months, bring blood pressure cuff  Call your gastroenterologist today for an appointment about the liver issues.  Called the mammogram center today to schedule your mammogram.  Increase your blood pressure medication to 1 tablet daily.  I sent a refill to your pharmacy.  Eat a healthy low sugar diet and get regular exercise.  Continue current medications otherwise.

## 2018-01-02 ENCOUNTER — Encounter: Payer: Self-pay | Admitting: Family Medicine

## 2018-01-02 DIAGNOSIS — M84374A Stress fracture, right foot, initial encounter for fracture: Secondary | ICD-10-CM

## 2018-01-02 DIAGNOSIS — E2839 Other primary ovarian failure: Secondary | ICD-10-CM

## 2018-01-03 NOTE — Addendum Note (Signed)
Addended by: Agnes Lawrence on: 01/03/2018 02:42 PM   Modules accepted: Orders

## 2018-01-04 ENCOUNTER — Other Ambulatory Visit: Payer: Self-pay | Admitting: Family Medicine

## 2018-01-04 DIAGNOSIS — Z1231 Encounter for screening mammogram for malignant neoplasm of breast: Secondary | ICD-10-CM

## 2018-01-10 ENCOUNTER — Ambulatory Visit (HOSPITAL_BASED_OUTPATIENT_CLINIC_OR_DEPARTMENT_OTHER)
Admission: RE | Admit: 2018-01-10 | Discharge: 2018-01-10 | Disposition: A | Discharge status: Home / Self Care | Payer: BLUE CROSS/BLUE SHIELD | Source: Ambulatory Visit | Attending: Family Medicine | Admitting: Family Medicine

## 2018-01-10 ENCOUNTER — Encounter (HOSPITAL_BASED_OUTPATIENT_CLINIC_OR_DEPARTMENT_OTHER): Payer: Self-pay

## 2018-01-10 DIAGNOSIS — E2839 Other primary ovarian failure: Secondary | ICD-10-CM | POA: Diagnosis not present

## 2018-01-10 DIAGNOSIS — M84374A Stress fracture, right foot, initial encounter for fracture: Secondary | ICD-10-CM | POA: Diagnosis not present

## 2018-01-10 DIAGNOSIS — M85852 Other specified disorders of bone density and structure, left thigh: Secondary | ICD-10-CM | POA: Insufficient documentation

## 2018-01-10 DIAGNOSIS — Z1231 Encounter for screening mammogram for malignant neoplasm of breast: Secondary | ICD-10-CM | POA: Diagnosis present

## 2018-01-13 ENCOUNTER — Encounter: Payer: Self-pay | Admitting: Family Medicine

## 2018-01-13 DIAGNOSIS — M8580 Other specified disorders of bone density and structure, unspecified site: Secondary | ICD-10-CM | POA: Insufficient documentation

## 2018-01-25 ENCOUNTER — Encounter: Payer: Self-pay | Admitting: Family Medicine

## 2018-02-01 ENCOUNTER — Encounter: Payer: Self-pay | Admitting: Gastroenterology

## 2018-02-01 ENCOUNTER — Ambulatory Visit (INDEPENDENT_AMBULATORY_CARE_PROVIDER_SITE_OTHER): Payer: BLUE CROSS/BLUE SHIELD | Admitting: Gastroenterology

## 2018-02-01 ENCOUNTER — Other Ambulatory Visit (INDEPENDENT_AMBULATORY_CARE_PROVIDER_SITE_OTHER): Payer: BLUE CROSS/BLUE SHIELD

## 2018-02-01 VITALS — BP 114/80 | HR 72 | Ht 65.0 in | Wt 198.0 lb

## 2018-02-01 DIAGNOSIS — Z1211 Encounter for screening for malignant neoplasm of colon: Secondary | ICD-10-CM | POA: Diagnosis not present

## 2018-02-01 DIAGNOSIS — K219 Gastro-esophageal reflux disease without esophagitis: Secondary | ICD-10-CM

## 2018-02-01 DIAGNOSIS — R748 Abnormal levels of other serum enzymes: Secondary | ICD-10-CM

## 2018-02-01 LAB — HEPATIC FUNCTION PANEL
ALK PHOS: 205 U/L — AB (ref 39–117)
ALT: 26 U/L (ref 0–35)
AST: 28 U/L (ref 0–37)
Albumin: 4.1 g/dL (ref 3.5–5.2)
BILIRUBIN TOTAL: 0.3 mg/dL (ref 0.2–1.2)
Bilirubin, Direct: 0.1 mg/dL (ref 0.0–0.3)
Total Protein: 7.3 g/dL (ref 6.0–8.3)

## 2018-02-01 LAB — FERRITIN: Ferritin: 63 ng/mL (ref 10.0–291.0)

## 2018-02-01 MED ORDER — OMEPRAZOLE 20 MG PO CPDR: 20.0000 mg | DELAYED_RELEASE_CAPSULE | Freq: Two times a day (BID) | ORAL | 3 refills | Status: DC

## 2018-02-01 NOTE — Patient Instructions (Addendum)
If you are age 64 or older, your body mass index should be between 23-30. Your Body mass index is 32.95 kg/m. If this is out of the aforementioned range listed, please consider follow up with your Primary Care Provider.  If you are age 97 or younger, your body mass index should be between 19-25. Your Body mass index is 32.95 kg/m. If this is out of the aformentioned range listed, please consider follow up with your Primary Care Provider.   You have been scheduled for an endoscopy. Please follow written instructions given to you at your visit today. If you use inhalers (even only as needed), please bring them with you on the day of your procedure. Your physician has requested that you go to www.startemmi.com and enter the access code given to you at your visit today. This web site gives a general overview about your procedure. However, you should still follow specific instructions given to you by our office regarding your preparation for the procedure.  We have sent the following medications to your pharmacy for you to pick up at your convenience: Omeprazole 20 mg: Take twice a day  Please go to the lab in the basement of our building to have lab work done as you leave today. Hit "B" for basement when you get on the elevator.  When the doors open the lab is on your left.  We will call you with the results. Thank you.  Thank you for entrusting me with your care and for choosing [facility], Dr. [place] Cellar

## 2018-02-01 NOTE — Progress Notes (Signed)
HPI :  62 year old female here for a follow-up visit, I last saw her in 2017 for reflux and colon cancer screening.  She has had symptoms of reflux for more than 20 years, historically had used mostly over-the-counter regimens. When I saw her she was having problems with chronic cough, coughing so hard it could be associated with vomiting. She been seen by allergist for this as well. At the time of our visit she was taking Protonix 40 mg twice daily as well as Zantac 300 mg a day. We had planned for an upper endoscopy however due to his illness in the family it was canceled and she never followed up for it. She reports she is been on omeprazole 20 mg twice daily since of last seen her. For the most part that has been working pretty well for her reflux symptoms, however she continues to have some trouble with chronic cough. She does not have any dysphagia at this time.  She otherwise was told to come back here for evaluation for elevation in her liver enzymes. In July she was noted to have an AP of 170 and ALT of 38, AST of 36. GGT was elevated. Previously she was first noted to have an elevated AP in January at 144 and then in February at 161. She's had an [place] in 05/05/2017 which showed gallstones and a fatty liver. She underwent cholecystectomy in March of this year. She denies any new medications. She previously had lost 20 pounds intentionally, has regained 12 pounds back over the past few months. Her father reportedly had liver cancer at the age of 31 but no reported history of cirrhosis. She thinks rare alcohol, perhaps one drink every 4-6 weeks.  She otherwise had her last colonoscopy in 2006. She was adamant she did not want any further colonoscopies a possible. She denies any blood in her stools or bowel issues. She had a negatives stool FIT in 2017. She continues to wish to avoid a colonoscopy.    Past Medical History:  Diagnosis Date  . ANXIETY 10/18/2007   Qualifier: Diagnosis of  By: Sarajane Jews MD,  Ishmael Holter   . Chronic low back pain   . Diabetes ([place]) 02/24/2016  . GERD 10/18/2007   Qualifier: Diagnosis of  By: Sherlynn Stalls, CMA, [place]    . Hyperlipemia 03/12/2014  . HYPERTENSION 10/18/2007   Qualifier: Diagnosis of  By: Sherlynn Stalls, CMA, [place]    . Insomnia 03/12/2014  . Irritable bowel syndrome 10/18/2007   Qualifier: Diagnosis of  By: Sarajane Jews MD, Ishmael Holter   . Recurrent knee pain    R knee hx meniscal tear 2002 with repair     Past Surgical History:  Procedure Laterality Date  . BREAST EXCISIONAL BIOPSY Left    Fibroid removed, 24 years ago   . BREAST SURGERY     fibroid  . CHOLECYSTECTOMY    . MENISCUS REPAIR Right   . TONSILLECTOMY     Family History  Problem Relation Age of Onset  . Cancer Mother        melanoma  . Hyperlipidemia Mother   . Breast cancer Mother   . Liver cancer Father   . Cancer Maternal Grandfather        unknown type  . Heart disease Paternal Grandfather   . Diabetes Maternal Aunt   . Diabetes Sister   . Lung disease Neg Hx   . Colon cancer Neg Hx    Social History   Tobacco Use  . Smoking  status: Never Smoker  . Smokeless tobacco: Never Used  Substance Use Topics  . Alcohol use: Yes    Alcohol/week: 0.0 standard drinks    Comment: 1 drink monthly  . Drug use: No   Current Outpatient Medications  Medication Sig Dispense Refill  . atorvastatin (LIPITOR) 10 MG tablet Take 1 tablet (10 mg total) by mouth daily. 90 tablet 3  . Cholecalciferol (VITAMIN D3 PO) Take 1,000 Units by mouth 2 (two) times daily.    . citalopram (CELEXA) 20 MG tablet Take 1 tablet (20 mg total) by mouth daily. 90 tablet 3  . Homeopathic Products (ZICAM ALLERGY RELIEF NA) Place into the nose as needed.    Marland Kitchen losartan-hydrochlorothiazide (HYZAAR) 50-12.5 MG tablet Take 1 tablet by mouth daily. 90 tablet 1  . MELATONIN PO Take by mouth as needed.    Marland Kitchen omeprazole (PRILOSEC) 20 MG capsule Take 1 capsule (20 mg total) by mouth 2 (two) times daily before a meal. 180 capsule 3   No  current facility-administered medications for this visit.    Allergies  Allergen Reactions  . Aspartame And Phenylalanine Other (See Comments)    Headache  . Codeine Nausea Only     Review of Systems: All systems reviewed and negative except where noted in HPI.    Dg Bone Density  Result Date: 01/10/2018 EXAM: DUAL X-RAY ABSORPTIOMETRY (DXA) FOR BONE MINERAL DENSITY IMPRESSION: Karen Hall Your patient Karen Hall completed a BMD test on 01/10/2018 using the [place] (analysis version: 16.SP2) manufactured by [employer]. The following summarizes the results of our evaluation. PATIENT: Name: Karen Hall, Karen Hall Patient ID: [id number] Birth Date: 1955/08/15 Height: 65.7 in. Gender: Female Measured: 01/10/2018 Weight: 193.6 lbs. Indications: Caucasian, Estrogen Deficiency, History of Osteopenia, Low Calcium Intake, Post Menopausal, History of Fracture (Adult) Fractures: Foot, Heel, Nose, Tib/Fib Treatments: Multivitamin, Vitamin D ASSESSMENT: The BMD measured at Femur Neck Left is 0.808 g/cm2 with a T-score of -1.7. This patient is considered osteopenic according to [facility]) criteria. Scan quality is good. Site Region Measured Date Measured Age WHO YA BMD Classification T-score AP Spine L1-L4 01[age]10/2018 64.2 Normal -1.0 1.055 g/cm2 DualFemur Neck Left 1/10/2018 [age] years Osteopenia -1.7 0.808 g/cm2 World Health Organization [facility]) criteria for post-menopausal, Caucasian Women: Normal       T-score at or above -1 SD Osteopenia   T-score between -1 and -2.5 SD Osteoporosis T-score at or below -2.5 SD RECOMMENDATION:1. All patients should optimize calcium and vitamin D intake. 2. Consider FDA-approved medical therapies in postmenopausal women and men aged 32 years and older, based on the following: a. A hip or vertebral(clinical or morphometric) fracture. b. T-Score < -2.5 at the femoral neck or spine after appropriate evaluation to exclude secondary causes c. Low  bone mass (T-score between -1.0 and -2.5 at the femoral neck or spine) and a 10 year probability of a hip fracture >3% or a 10 year probability of major osteoporosis-related fracture > 20% based on the [place]-adapted WHO algorithm d. Clinical judgement and/or patient preferences may indicate treatment for people with 10-year fracture probabilities above or below these levels FOLLOW-UP: Patients with diagnosis of osteoporosis or at high risk for fracture should have regular bone mineral density tests. For patients eligible for Medicare, routine testing is allowed once every 2 years. The testing frequency can be increased to one year for patients who have rapidly progressing disease, those who are receiving or discontinuing medical therapy to restore bone mass, or have additional  risk factors. I have reviewed this report and agree with the above findings. [facility] Patient: Karen Hall   Referring Physician: Lucretia Hall Birth Date: [birth year] [age] years Patient ID: [id number] Height: 65.7 in. Weight: 193.6 lbs. Measured: 01/10/2018 12:13:20 PM (16 SP 2) Gender: Female Ethnicity: White Analyzed: 01/10/2018 12:13:56 PM (16 SP 2) FRAX* 10-year Probability of Fracture Based on femoral neck BMD: DualFemur (Left) Major Osteoporotic Fracture: 14.8% Hip Fracture:                1.7% Population:                  [place] (Caucasian) Risk Factors:                History of Fracture (Adult) *FRAX is a [place] [employer] for Metabolic Bone Disease, a World Pharmacologist (WHO) [facility]. ASSESSMENT: The probability of a major osteoporotic fracture is 14.8% within the next ten years. The probability of a hip fracture is 1.7% within the next ten years. Electronically Signed   By: Lowella Grip III M.D.   On: 01/10/2018 12:04   Mm 3d Screen Breast Bilateral  Result Date: 01/10/2018 CLINICAL DATA:  Screening. EXAM: DIGITAL SCREENING BILATERAL  MAMMOGRAM WITH TOMO AND CAD COMPARISON:  Previous exam(s). ACR Breast Density Category b: There are scattered areas of fibroglandular density. FINDINGS: There are no findings suspicious for malignancy. Images were processed with CAD. IMPRESSION: No mammographic evidence of malignancy. A result letter of this screening mammogram will be mailed directly to the patient. RECOMMENDATION: Screening mammogram in one year. (Code:SM-B-01Y) BI-RADS CATEGORY  1: Negative. Electronically Signed   By: Ammie Ferrier M.D.   On: 01/10/2018 15:55   Lab Results  Component Value Date   ALT 26 02/01/2018   AST 28 02/01/2018   ALKPHOS 205 (H) 02/01/2018   BILITOT 0.3 02/01/2018   No results found for: INR, PROTIME  Lab Results  Component Value Date   WBC 7.4 09/13/2017   HGB 13.3 09/13/2017   HCT 40.6 09/13/2017   MCV 85.4 09/13/2017   PLT 238.0 09/13/2017   No results found for: GGT   Physical Exam: BP 114/80   Pulse 72   Ht 5\' 5"  (1.651 m)   Wt 198 lb (89.8 kg)   BMI 32.95 kg/m  Constitutional: Pleasant,well-developed, female in no acute distress. HEENT: Normocephalic and atraumatic. Conjunctivae are normal. No scleral icterus. Neck supple.  Cardiovascular: Normal rate, regular rhythm.  Pulmonary/chest: Effort normal and breath sounds normal. No wheezing, rales or rhonchi. Abdominal: Soft, nondistended, nontender. There are no masses palpable. No hepatomegaly. Extremities: no edema Lymphadenopathy: No cervical adenopathy noted. Neurological: Alert and oriented to person place and time. Skin: Skin is warm and dry. No rashes noted. Psychiatric: Normal mood and affect. Behavior is normal.   ASSESSMENT AND PLAN: 62 year old female here for reassessment of the following issues  Elevated liver enzymes - persistent elevation in alkaline phosphatase, with mild ALT elevation on last lab. We discussed differential for this. She does have fatty liver noted on ultrasound however that'll be unusual  to cause predominant elevation in alkaline phosphatase. I will repeat LFTs today to see where the trending and send additional serologies to workup for chronic liver diseases including AMA/autoimmune. If these are unremarkable and alkaline phosphatase elevation persists, will consider MRCP. Otherwise counseled her on fatty liver, recommend she work on weight loss again and continue to  minimize her alcohol intake. We'll get back to her with results of labs and further recommendations.  GERD - long-standing reflux, improved on omeprazole twice daily however unclear if her cough symptoms are related or not. We discussed with her how she wanted an upper endoscopy to further evaluate, she did want to proceed with this after discussion of options. If she has no large hiatal hernia, given she's been on ferritin dose of PPI long-standing, we could consider having her evaluated for TIF if she wanted to come off medications. We'll await initial EGD first and continue omeprazole in the interim.  Colon cancer screening - total colonoscopy is recommended as first-line screening tests, she wishes to avoid that if at all possible. She is agreeable to another FIT test. If this is positive she is agreeable to colonoscopy, if negative we'll recommend doing it again in 1 year. She agreed  [place] Cellar, MD [facility]

## 2018-02-02 LAB — IRON AND TIBC
Iron Saturation: 19 % (ref 15–55)
Iron: 53 ug/dL (ref 27–139)
Total Iron Binding Capacity: 280 ug/dL (ref 250–450)
UIBC: 227 ug/dL (ref 118–369)

## 2018-02-06 LAB — MITOCHONDRIAL ANTIBODIES: Mitochondrial M2 Ab, IgG: <=20 U

## 2018-02-06 LAB — HEPATITIS B SURFACE ANTIBODY,QUALITATIVE: Hep B S Ab: NONREACTIVE

## 2018-02-06 LAB — HEPATITIS C ANTIBODY
Hepatitis C Ab: NONREACTIVE
SIGNAL TO CUT-OFF: 0.03 (ref <1.00)

## 2018-02-06 LAB — HEPATITIS B SURFACE ANTIGEN: Hepatitis B Surface Ag: NONREACTIVE

## 2018-02-06 LAB — ANTI-NUCLEAR AB-TITER (ANA TITER): ANA Titer 1: 1:1280 {titer} — ABNORMAL HIGH

## 2018-02-06 LAB — ANTI-SMOOTH MUSCLE ANTIBODY, IGG: ACTIN (SMOOTH MUSCLE) ANTIBODY (IGG): 59 U — AB (ref <20)

## 2018-02-06 LAB — ANA: Anti Nuclear Antibody(ANA): POSITIVE — AB

## 2018-02-06 LAB — IGG: IgG (Immunoglobin G), Serum: 1221 mg/dL (ref 600–1540)

## 2018-02-06 LAB — HEPATITIS A ANTIBODY, TOTAL: HEPATITIS A AB,TOTAL: NONREACTIVE

## 2018-02-08 ENCOUNTER — Other Ambulatory Visit: Payer: Self-pay

## 2018-02-08 DIAGNOSIS — R7989 Other specified abnormal findings of blood chemistry: Secondary | ICD-10-CM

## 2018-02-08 DIAGNOSIS — R945 Abnormal results of liver function studies: Principal | ICD-10-CM

## 2018-02-12 HISTORY — PX: LIVER BIOPSY: SHX301

## 2018-02-15 ENCOUNTER — Ambulatory Visit (AMBULATORY_SURGERY_CENTER): Payer: BLUE CROSS/BLUE SHIELD | Admitting: Gastroenterology

## 2018-02-15 ENCOUNTER — Encounter: Payer: Self-pay | Admitting: Gastroenterology

## 2018-02-15 ENCOUNTER — Encounter: Payer: Self-pay | Admitting: Family Medicine

## 2018-02-15 VITALS — BP 125/95 | HR 82 | Temp 98.6°F | Resp 16 | Ht 65.0 in | Wt 198.0 lb

## 2018-02-15 DIAGNOSIS — K449 Diaphragmatic hernia without obstruction or gangrene: Secondary | ICD-10-CM

## 2018-02-15 DIAGNOSIS — K219 Gastro-esophageal reflux disease without esophagitis: Secondary | ICD-10-CM | POA: Diagnosis present

## 2018-02-15 MED ORDER — SODIUM CHLORIDE 0.9 % IV SOLN: 500.0000 mL | Freq: Once | INTRAVENOUS | Status: DC

## 2018-02-15 NOTE — Progress Notes (Signed)
Pt states she has had "laryngitis for 4 weeks."  She has dry cough

## 2018-02-15 NOTE — Progress Notes (Signed)
PT taken to PACU. Monitors in place. VSS. Report given to RN. 

## 2018-02-15 NOTE — Patient Instructions (Signed)
Thank you for allowing [place] to care for you today!  Resume previous diet and medications today.  Return to normal activities tomorrow.   YOU HAD AN ENDOSCOPIC PROCEDURE TODAY AT [facility]:   Refer to the procedure report that was given to you for any specific questions about what was found during the examination.  If the procedure report does not answer your questions, please call your gastroenterologist to clarify.  If you requested that your care partner not be given the details of your procedure findings, then the procedure report has been included in a sealed envelope for you to review at your convenience later.  YOU SHOULD EXPECT: Some feelings of bloating in the abdomen. Passage of more gas than usual.  Walking can help get rid of the air that was put into your GI tract during the procedure and reduce the bloating. If you had a lower endoscopy (such as a colonoscopy or flexible sigmoidoscopy) you may notice spotting of blood in your stool or on the toilet paper. If you underwent a bowel prep for your procedure, you may not have a normal bowel movement for a few days.  Please Note:  You might notice some irritation and congestion in your nose or some drainage.  This is from the oxygen used during your procedure.  There is no need for concern and it should clear up in a day or so.  SYMPTOMS TO REPORT IMMEDIATELY:    Following upper endoscopy (EGD)  Vomiting of blood or coffee ground material  New chest pain or pain under the shoulder blades  Painful or persistently difficult swallowing  New shortness of breath  Fever of 100F or higher  Black, tarry-looking stools  For urgent or emergent issues, a gastroenterologist can be reached at any hour by calling [phone].   DIET:  We do recommend a small meal at first, but then you may proceed to your regular diet.  Drink plenty of fluids but you should avoid alcoholic beverages for 24 hours.  ACTIVITY:  You should  plan to take it easy for the rest of today and you should NOT DRIVE or use heavy machinery until tomorrow (because of the sedation medicines used during the test).    FOLLOW UP: Our staff will call the number listed on your records the next business day following your procedure to check on you and address any questions or concerns that you may have regarding the information given to you following your procedure. If we do not reach you, we will leave a message.  However, if you are feeling well and you are not experiencing any problems, there is no need to return our call.  We will assume that you have returned to your regular daily activities without incident.  If any biopsies were taken you will be contacted by phone or by letter within the next 1-3 weeks.  Please call us at [phone] if you have not heard about the biopsies in 3 weeks.    SIGNATURES/CONFIDENTIALITY: You and/or your care partner have signed paperwork which will be entered into your electronic medical record.  These signatures attest to the fact that that the information above on your After Visit Summary has been reviewed and is understood.  Full responsibility of the confidentiality of this discharge information lies with you and/or your care-partner.

## 2018-02-16 ENCOUNTER — Telehealth: Payer: Self-pay

## 2018-02-16 NOTE — Telephone Encounter (Signed)
Tried to leave message, but mailbox was full.  Will try again this afternoon.

## 2018-02-16 NOTE — Op Note (Signed)
[facility] Patient Name: Karen Hall Procedure Date: 02/15/2018 10:11 AM MRN: [id number] Endoscopist: Remo Lipps P. Havery Moros , MD Age: 62 Referring MD:  Date of Birth: 06/05/1955 Gender: Female Account #: [id number] Procedure:                Upper GI endoscopy Indications:              Follow-up of gastro-esophageal reflux disease -                            longstanding symptoms, with some chronic cough                            unclear if related to reflux, symptoms otherwise                            controlled on high dose PPI and H2 blocker - rule                            out Barrett's, evaluate for candidacy for possible                            TIF Medicines:                Monitored Anesthesia Care Procedure:                Pre-Anesthesia Assessment:                           - Prior to the procedure, a History and Physical                            was performed, and patient medications and                            allergies were reviewed. The patient's tolerance of                            previous anesthesia was also reviewed. The risks                            and benefits of the procedure and the sedation                            options and risks were discussed with the patient.                            All questions were answered, and informed consent                            was obtained. Prior Anticoagulants: The patient has                            taken no previous anticoagulant or antiplatelet  agents. ASA Grade Assessment: III - A patient with                            severe systemic disease. After reviewing the risks                            and benefits, the patient was deemed in                            satisfactory condition to undergo the procedure.                           After obtaining informed consent, the endoscope was                            passed under direct vision. Throughout the                             procedure, the patient's blood pressure, pulse, and                            oxygen saturations were monitored continuously. The                            Endoscope was introduced through the mouth, and                            advanced to the second part of duodenum. The upper                            GI endoscopy was accomplished without difficulty.                            The patient tolerated the procedure well. Scope In: Scope Out: Findings:                 Esophagogastric landmarks were identified: the                            Z-line was found at 35 cm, the gastroesophageal                            junction was found at 35 cm and the upper extent of                            the gastric folds was found at 36 cm from the                            incisors. Z line was slightly irregular but did not                            meet criteria for Barrett's biopsies  A 1 cm hiatal hernia was present.                           The exam of the esophagus was otherwise normal.                           The entire examined stomach was normal.                           The duodenal bulb and second portion of the                            duodenum were normal. Complications:            No immediate complications. Estimated blood loss:                            None. Estimated Blood Loss:     Estimated blood loss: none. Impression:               - Esophagogastric landmarks identified.                           - 1 cm hiatal hernia.                           - Slightly irregular z-line but no obvious Barrett's                           - Normal stomach.                           - Normal duodenal bulb and second portion of the                            duodenum. Recommendation:           - Patient has a contact number available for                            emergencies. The signs and symptoms of potential                             delayed complications were discussed with the                            patient. Return to normal activities tomorrow.                            Written discharge instructions were provided to the                            patient.                           - Resume previous diet.                           -  Continue present medications.                           - Consideration for referral for TIF if you wish to                            come off chronic antacids, will discuss options                            with patient [name]. [name], MD 02/15/2018 10:33:09 AM This report has been signed electronically.

## 2018-02-16 NOTE — Telephone Encounter (Signed)
  Follow up Call-  Call back number 02/15/2018  Post procedure Call Back phone  # [phone]  Permission to leave phone message Yes  Some recent data might be hidden     Patient questions:  Do you have a fever, pain , or abdominal swelling? No. Pain Score  0 *  Have you tolerated food without any problems? Yes.    Have you been able to return to your normal activities? Yes.    Do you have any questions about your discharge instructions: Diet   No. Medications  No. Follow up visit  No.  Do you have questions or concerns about your Care? No.  Actions: * If pain score is 4 or above: No action needed, pain <4.

## 2018-02-17 ENCOUNTER — Ambulatory Visit (INDEPENDENT_AMBULATORY_CARE_PROVIDER_SITE_OTHER): Payer: BLUE CROSS/BLUE SHIELD | Admitting: Gastroenterology

## 2018-02-17 DIAGNOSIS — Z23 Encounter for immunization: Secondary | ICD-10-CM

## 2018-02-21 ENCOUNTER — Telehealth: Payer: Self-pay

## 2018-02-21 NOTE — Telephone Encounter (Signed)
Called patient to set up an appointment as a possible TIF candidate. Voicemail box was full.

## 2018-02-22 NOTE — Telephone Encounter (Signed)
Attempted to call patient again no answer and voicemail box is full. Will try again later.

## 2018-02-23 ENCOUNTER — Other Ambulatory Visit: Payer: Self-pay | Admitting: Radiology

## 2018-02-24 ENCOUNTER — Encounter (HOSPITAL_COMMUNITY): Payer: Self-pay

## 2018-02-24 ENCOUNTER — Other Ambulatory Visit: Payer: Self-pay

## 2018-02-24 ENCOUNTER — Ambulatory Visit (HOSPITAL_COMMUNITY)
Admission: RE | Admit: 2018-02-24 | Discharge: 2018-02-24 | Disposition: A | Discharge status: Home / Self Care | Payer: BLUE CROSS/BLUE SHIELD | Source: Ambulatory Visit | Attending: Gastroenterology | Admitting: Gastroenterology

## 2018-02-24 DIAGNOSIS — K219 Gastro-esophageal reflux disease without esophagitis: Secondary | ICD-10-CM | POA: Diagnosis not present

## 2018-02-24 DIAGNOSIS — E785 Hyperlipidemia, unspecified: Secondary | ICD-10-CM | POA: Diagnosis not present

## 2018-02-24 DIAGNOSIS — K732 Chronic active hepatitis, not elsewhere classified: Secondary | ICD-10-CM | POA: Insufficient documentation

## 2018-02-24 DIAGNOSIS — R945 Abnormal results of liver function studies: Secondary | ICD-10-CM

## 2018-02-24 DIAGNOSIS — R7989 Other specified abnormal findings of blood chemistry: Secondary | ICD-10-CM

## 2018-02-24 DIAGNOSIS — I10 Essential (primary) hypertension: Secondary | ICD-10-CM | POA: Diagnosis not present

## 2018-02-24 DIAGNOSIS — K760 Fatty (change of) liver, not elsewhere classified: Secondary | ICD-10-CM | POA: Insufficient documentation

## 2018-02-24 DIAGNOSIS — Z833 Family history of diabetes mellitus: Secondary | ICD-10-CM | POA: Insufficient documentation

## 2018-02-24 DIAGNOSIS — Z9049 Acquired absence of other specified parts of digestive tract: Secondary | ICD-10-CM | POA: Diagnosis not present

## 2018-02-24 DIAGNOSIS — E119 Type 2 diabetes mellitus without complications: Secondary | ICD-10-CM | POA: Diagnosis not present

## 2018-02-24 DIAGNOSIS — K589 Irritable bowel syndrome without diarrhea: Secondary | ICD-10-CM | POA: Insufficient documentation

## 2018-02-24 DIAGNOSIS — Z8249 Family history of ischemic heart disease and other diseases of the circulatory system: Secondary | ICD-10-CM | POA: Diagnosis not present

## 2018-02-24 DIAGNOSIS — Z79899 Other long term (current) drug therapy: Secondary | ICD-10-CM | POA: Diagnosis not present

## 2018-02-24 LAB — PROTIME-INR
INR: 0.97
Prothrombin Time: 12.8 seconds (ref 11.4–15.2)

## 2018-02-24 LAB — CBC
HCT: 44 % (ref 36.0–46.0)
Hemoglobin: 13.8 g/dL (ref 12.0–15.0)
MCH: 28 pg (ref 26.0–34.0)
MCHC: 31.4 g/dL (ref 30.0–36.0)
MCV: 89.4 fL (ref 80.0–100.0)
Platelets: 267 10*3/uL (ref 150–400)
RBC: 4.92 MIL/uL (ref 3.87–5.11)
RDW: 13.4 % (ref 11.5–15.5)
WBC: 8.7 10*3/uL (ref 4.0–10.5)
nRBC: 0 % (ref 0.0–0.2)

## 2018-02-24 LAB — APTT: aPTT: 26 seconds (ref 24–36)

## 2018-02-24 MED ORDER — SODIUM CHLORIDE 0.9 % IV SOLN
INTRAVENOUS | Status: DC
  Administered 2018-02-24: 11:00:00 via INTRAVENOUS

## 2018-02-24 MED ORDER — FENTANYL CITRATE (PF) 100 MCG/2ML IJ SOLN
INTRAMUSCULAR | Status: AC
  Filled 2018-02-24: qty 2

## 2018-02-24 MED ORDER — LIDOCAINE HCL 1 % IJ SOLN
INTRAMUSCULAR | Status: AC
  Filled 2018-02-24: qty 20

## 2018-02-24 MED ORDER — MIDAZOLAM HCL 2 MG/2ML IJ SOLN
INTRAMUSCULAR | Status: AC
  Filled 2018-02-24: qty 4

## 2018-02-24 MED ORDER — GELATIN ABSORBABLE 12-7 MM EX MISC
CUTANEOUS | Status: AC
  Filled 2018-02-24: qty 1

## 2018-02-24 MED ORDER — LIDOCAINE HCL (PF) 1 % IJ SOLN
INTRAMUSCULAR | Status: AC | PRN
  Administered 2018-02-24: 10 mL

## 2018-02-24 MED ORDER — HYDROCODONE-ACETAMINOPHEN 5-325 MG PO TABS: 1.0000 | ORAL_TABLET | ORAL | Status: DC | PRN

## 2018-02-24 MED ORDER — MIDAZOLAM HCL 2 MG/2ML IJ SOLN
INTRAMUSCULAR | Status: AC | PRN
  Administered 2018-02-24 (×2): 1 mg via INTRAVENOUS

## 2018-02-24 MED ORDER — FENTANYL CITRATE (PF) 100 MCG/2ML IJ SOLN
INTRAMUSCULAR | Status: AC | PRN
  Administered 2018-02-24 (×2): 50 ug via INTRAVENOUS

## 2018-02-24 NOTE — Consult Note (Signed)
Chief Complaint: Patient was seen in consultation today for image guided random core liver biopsy   Referring Physician(s): Armbruster,Steven P  Supervising Physician: Jacqulynn Cadet  Patient Status: [facility] - Out-pt  History of Present Illness: Karen Hall is a 62 y.o. female with history of diabetes, GERD, hypertension, hyperlipidemia, IBS, and now with elevated LFTs, fatty liver by ultrasound, positive anti-smooth muscle antibody and positive ANA. She pesents today for image guided random core liver biopsy to rule out autoimmune hepatitis/other pathology.  Past Medical History:  Diagnosis Date  . Allergy   . ANXIETY 10/18/2007   Qualifier: Diagnosis of  By: Sarajane Jews MD, Ishmael Holter   . Cataract    "beginnings of"  . Chronic low back pain   . Diabetes ([place]) 02/24/2016  . GERD 10/18/2007   Qualifier: Diagnosis of  By: Sherlynn Stalls, CMA, [place]    . Hyperlipemia 03/12/2014  . HYPERTENSION 10/18/2007   Qualifier: Diagnosis of  By: Sherlynn Stalls, CMA, [place]    . Insomnia 03/12/2014  . Irritable bowel syndrome 10/18/2007   Qualifier: Diagnosis of  By: Sarajane Jews MD, Ishmael Holter   . Osteopenia   . Recurrent knee pain    R knee hx meniscal tear 2002 with repair    Past Surgical History:  Procedure Laterality Date  . BREAST EXCISIONAL BIOPSY Left    Fibroid removed, 24 years ago   . BREAST SURGERY     fibroid  . CHOLECYSTECTOMY    . COLONOSCOPY    . FOOT SURGERY Right   . MENISCUS REPAIR Right   . TONSILLECTOMY      Allergies: Aspartame and phenylalanine and Codeine  Medications: Prior to Admission medications   Medication Sig Start Date End Date Taking? Authorizing Provider  atorvastatin (LIPITOR) 10 MG tablet Take 1 tablet (10 mg total) by mouth daily. 04/12/17   [name], DO  Cholecalciferol (VITAMIN D3 PO) Take 1,000 Units by mouth 2 (two) times daily.    [provider]  citalopram (CELEXA) 20 MG tablet Take 1 tablet (20 mg total) by mouth daily. 04/12/17   [name], DO    diphenhydrAMINE-APAP, sleep, (TYLENOL PM EXTRA STRENGTH PO) Take by mouth as needed.    [provider]  Homeopathic Products [facility] ALLERGY RELIEF NA) Place into the nose as needed.    [provider]  losartan-hydrochlorothiazide (HYZAAR) 50-12.5 MG tablet Take 1 tablet by mouth daily. 12/19/17   Colin Benton R, DO  MELATONIN PO Take by mouth as needed.    [provider]  omeprazole (PRILOSEC) 20 MG capsule Take 1 capsule (20 mg total) by mouth 2 (two) times daily before a meal. 02/01/18   Armbruster, [name], MD     Family History  Problem Relation Age of Onset  . Cancer Mother        melanoma  . Hyperlipidemia Mother   . Breast cancer Mother   . Liver cancer Father   . Cancer Maternal Grandfather        unknown type  . Heart disease Paternal Grandfather   . Diabetes Maternal Aunt   . Diabetes Sister   . Lung disease Neg Hx   . Colon cancer Neg Hx   . Esophageal cancer Neg Hx   . Rectal cancer Neg Hx   . Stomach cancer Neg Hx     Social History   Socioeconomic History  . Marital status: Widowed    Spouse name: Not on file  . Number of children: Not on file  .  Years of education: Not on file  . Highest education level: Not on file  Occupational History  . Not on file  Social Needs  . Financial resource strain: Not on file  . Food insecurity:    Worry: Not on file    Inability: Not on file  . Transportation needs:    Medical: Not on file    Non-medical: Not on file  Tobacco Use  . Smoking status: Never Smoker  . Smokeless tobacco: Never Used  Substance and Sexual Activity  . Alcohol use: Yes    Alcohol/week: 0.0 standard drinks    Comment: 1 drink monthly  . Drug use: No  . Sexual activity: Not on file  Lifestyle  . Physical activity:    Days per week: Not on file    Minutes per session: Not on file  . Stress: Not on file  Relationships  . Social connections:    Talks on phone: Not on file    Gets together: Not on file     Attends religious service: Not on file    Active member of club or organization: Not on file    Attends meetings of clubs or organizations: Not on file    Relationship status: Not on file  Other Topics Concern  . Not on file  Social History Narrative   Work or School: Press photographer - [employer]       Home Situation: lives alone      Spiritual Beliefs: none      Lifestyle: no regular exercise; poor diet      [facility]:   Originally from [place]. She has lived in [place], [place], [place], [place]. She has a dog currently. No bird exposure. No hot tub exposure. No mold exposure. Currently works as a Adult nurse.          Review of Systems denies fever, headache, chest pain, dyspnea, abdominal/back pain, nausea, vomiting or bleeding.  She does have occasional cough.  Vital Signs: BP (!) 139/102   Pulse 99   Temp 98.3 F (36.8 C) (Oral)   Resp 20   SpO2 97%   Physical Exam awake, alert.  Chest clear to auscultation bilaterally.  Heart with regular rate and rhythm.  Abdomen soft, positive bowel sounds, nontender.  No significant lower extremity edema.  Imaging: No results found.  Labs:  CBC: Recent Labs    04/12/17 1200 09/13/17 1642  WBC 6.4 7.4  HGB 13.8 13.3  HCT 42.4 40.6  PLT 230.0 238.0    COAGS: No results for input(s): INR, APTT in the last 8760 hours.  BMP: Recent Labs    04/12/17 1200 09/13/17 1642  NA 140 140  K 3.8 3.6  CL 104 107  CO2 27 26  GLUCOSE 126* 126*  BUN 20 17  CALCIUM 9.8 9.4  CREATININE 1.16 0.90    LIVER FUNCTION TESTS: Recent Labs    04/12/17 1200 04/21/17 1416 09/13/17 1642 02/01/18 0950  BILITOT 0.6 0.5 0.5 0.3  AST 31 29 36 28  ALT 30 28 38* 26  ALKPHOS 144* 161* 170* 205*  PROT 7.1 7.0 7.0 7.3  ALBUMIN 4.2 4.1 4.0 4.1    TUMOR MARKERS: No results for input(s): AFPTM, CEA, CA199, CHROMGRNA in the last 8760 hours.  Assessment and Plan: 62 y.o. female with history of diabetes, GERD,  hypertension, hyperlipidemia, IBS, and now with elevated LFTs, fatty liver by ultrasound, positive anti-smooth muscle antibody and positive ANA. She pesents today for image  guided random core liver biopsy to rule out autoimmune hepatitis/other pathology.Risks and benefits discussed with the patient/sister including, but not limited to bleeding, infection, damage to adjacent structures or low yield requiring additional tests.  All of the patient's questions were answered, patient is agreeable to proceed. Consent signed and in chart.  LABS PENDING   Thank you for this interesting consult.  I greatly enjoyed meeting Karen Hall and look forward to participating in their care.  A copy of this report was sent to the requesting provider on this date.  Electronically Signed: D. Rowe Robert, PA-C 02/24/2018, 11:14 AM   I spent a total of  25 minutes   in face to face in clinical consultation, greater than 50% of which was counseling/coordinating care for image guided random core liver biopsy

## 2018-02-24 NOTE — Discharge Instructions (Signed)
Moderate Conscious Sedation, Adult, Care After °These instructions provide you with information about caring for yourself after your procedure. Your health care provider may also give you more specific instructions. Your treatment has been planned according to current medical practices, but problems sometimes occur. Call your health care provider if you have any problems or questions after your procedure. °What can I expect after the procedure? °After your procedure, it is common: °· To feel sleepy for several hours. °· To feel clumsy and have poor balance for several hours. °· To have poor judgment for several hours. °· To vomit if you eat too soon. ° °Follow these instructions at home: °For at least 24 hours after the procedure: ° °· Do not: °? Participate in activities where you could fall or become injured. °? Drive. °? Use heavy machinery. °? Drink alcohol. °? Take sleeping pills or medicines that cause drowsiness. °? Make important decisions or sign legal documents. °? Take care of children on your own. °· Rest. °Eating and drinking °· Follow the diet recommended by your health care provider. °· If you vomit: °? Drink water, juice, or soup when you can drink without vomiting. °? Make sure you have little or no nausea before eating solid foods. °General instructions °· Have a responsible adult stay with you until you are awake and alert. °· Take over-the-counter and prescription medicines only as told by your health care provider. °· If you smoke, do not smoke without supervision. °· Keep all follow-up visits as told by your health care provider. This is important. °Contact a health care provider if: °· You keep feeling nauseous or you keep vomiting. °· You feel light-headed. °· You develop a rash. °· You have a fever. °Get help right away if: °· You have trouble breathing. °This information is not intended to replace advice given to you by your health care provider. Make sure you discuss any questions you have  with your health care provider. °Document Released: 12/20/2012 Document Revised: 08/04/2015 Document Reviewed: 06/21/2015 °Elsevier Interactive Patient Education © 2018 Elsevier Inc[employer] °Liver Biopsy, Care After °Refer to this sheet in the next few weeks. These instructions provide you with information on caring for yourself after your procedure. Your health care provider may also give you more specific instructions. Your treatment has been planned according to current medical practices, but problems sometimes occur. Call your health care provider if you have any problems or questions after your procedure. °What can I expect after the procedure? °After your procedure, it is typical to have the following: °· A small amount of discomfort in the area where the biopsy was done and in the right shoulder or shoulder blade. °· A small amount of bruising around the area where the biopsy was done and on the skin over the liver. °· Sleepiness and fatigue for the rest of the day. ° °Follow these instructions at home: °· Rest at home for 1-2 days or as directed by your health care provider. °· Have a friend or family member stay with you for at least 24 hours. °· Because of the medicines used during the procedure, you should not do the following things in the first 24 hours: °? Drive. °? Use machinery. °? Be responsible for the care of other people. °? Sign legal documents. °? Take a bath or shower. °· There are many different ways to close and cover an incision, including stitches, skin glue, and adhesive strips. Follow your health care provider's instructions on: °? Incision care. °?   Bandage (dressing) changes and removal. °? Incision closure removal. °· Do not drink alcohol in the first week. °· Do not lift more than 5 pounds or play contact sports for 2 weeks after this test. °· Take medicines only as directed by your health care provider. Do not take medicine containing aspirin or non-steroidal anti-inflammatory medicines  such as ibuprofen for 1 week after this test. °· It is your responsibility to get your test results. °Contact a health care provider if: °· You have increased bleeding from an incision that results in more than a small spot of blood. °· You have redness, swelling, or increasing pain in any incisions. °· You notice a discharge or a bad smell coming from any of your incisions. °· You have a fever or chills. °Get help right away if: °· You develop swelling, bloating, or pain in your abdomen. °· You become dizzy or faint. °· You develop a rash. °· You are nauseous or vomit. °· You have difficulty breathing, feel short of breath, or feel faint. °· You develop chest pain. °· You have problems with your speech or vision. °· You have trouble balancing or moving your arms or legs. °This information is not intended to replace advice given to you by your health care provider. Make sure you discuss any questions you have with your health care provider. °Document Released: 09/18/2004 Document Revised: 08/07/2015 Document Reviewed: 04/27/2013 °Elsevier Interactive Patient Education © 2018 Elsevier Inc. ° °

## 2018-02-24 NOTE — Procedures (Signed)
Interventional Radiology Procedure Note  Procedure: US guided random liver biopsy  Complications: None  Estimated Blood Loss: None  Recommendations: - Bedrest x 2 hrs - DC home - Path pending  Signed,  Criselda Peaches, MD

## 2018-03-13 ENCOUNTER — Encounter: Payer: Self-pay | Admitting: Family Medicine

## 2018-03-13 NOTE — Telephone Encounter (Signed)
I called the pt and informed her with these symptoms she does need an appt.  Appt scheduled with Dr Elease Hashimoto tomorrow as Dr Julianne Rice schedule was full.

## 2018-03-14 ENCOUNTER — Other Ambulatory Visit: Payer: Self-pay

## 2018-03-14 ENCOUNTER — Encounter: Payer: Self-pay | Admitting: Family Medicine

## 2018-03-14 ENCOUNTER — Ambulatory Visit: Payer: BLUE CROSS/BLUE SHIELD | Admitting: Family Medicine

## 2018-03-14 VITALS — BP 112/74 | HR 106 | Temp 98.1°F | Ht 65.0 in | Wt 191.5 lb

## 2018-03-14 DIAGNOSIS — J0110 Acute frontal sinusitis, unspecified: Secondary | ICD-10-CM | POA: Diagnosis not present

## 2018-03-14 MED ORDER — AMOXICILLIN-POT CLAVULANATE 875-125 MG PO TABS: 1.0000 | ORAL_TABLET | Freq: Two times a day (BID) | ORAL | 0 refills | Status: DC

## 2018-03-14 NOTE — Patient Instructions (Signed)
Sinusitis, Adult  Sinusitis is inflammation of your sinuses. Sinuses are hollow spaces in the bones around your face. Your sinuses are located:   Around your eyes.   In the middle of your forehead.   Behind your nose.   In your cheekbones.  Mucus normally drains out of your sinuses. When your nasal tissues become inflamed or swollen, mucus can become trapped or blocked. This allows bacteria, viruses, and fungi to grow, which leads to infection. Most infections of the sinuses are caused by a virus.  Sinusitis can develop quickly. It can last for up to 4 weeks (acute) or for more than 12 weeks (chronic). Sinusitis often develops after a cold.  What are the causes?  This condition is caused by anything that creates swelling in the sinuses or stops mucus from draining. This includes:   Allergies.   Asthma.   Infection from bacteria or viruses.   Deformities or blockages in your nose or sinuses.   Abnormal growths in the nose (nasal polyps).   Pollutants, such as chemicals or irritants in the air.   Infection from fungi (rare).  What increases the risk?  You are more likely to develop this condition if you:   Have a weak body defense system (immune system).   Do a lot of swimming or diving.   Overuse nasal sprays.   Smoke.  What are the signs or symptoms?  The main symptoms of this condition are pain and a feeling of pressure around the affected sinuses. Other symptoms include:   Stuffy nose or congestion.   Thick drainage from your nose.   Swelling and warmth over the affected sinuses.   Headache.   Upper toothache.   A cough that may get worse at night.   Extra mucus that collects in the throat or the back of the nose (postnasal drip).   Decreased sense of smell and taste.   Fatigue.   A fever.   Sore throat.   Bad breath.  How is this diagnosed?  This condition is diagnosed based on:   Your symptoms.   Your medical history.   A physical exam.   Tests to find out if your condition is  acute or chronic. This may include:  ? Checking your nose for nasal polyps.  ? Viewing your sinuses using a device that has a light (endoscope).  ? Testing for allergies or bacteria.  ? Imaging tests, such as an MRI or CT scan.  In rare cases, a bone biopsy may be done to rule out more serious types of fungal sinus disease.  How is this treated?  Treatment for sinusitis depends on the cause and whether your condition is chronic or acute.   If caused by a virus, your symptoms should go away on their own within 10 days. You may be given medicines to relieve symptoms. They include:  ? Medicines that shrink swollen nasal passages (topical intranasal decongestants).  ? Medicines that treat allergies (antihistamines).  ? A spray that eases inflammation of the nostrils (topical intranasal corticosteroids).  ? Rinses that help get rid of thick mucus in your nose (nasal saline washes).   If caused by bacteria, your health care provider may recommend waiting to see if your symptoms improve. Most bacterial infections will get better without antibiotic medicine. You may be given antibiotics if you have:  ? A severe infection.  ? A weak immune system.   If caused by narrow nasal passages or nasal polyps, you may need   to have surgery.  Follow these instructions at home:  Medicines   Take, use, or apply over-the-counter and prescription medicines only as told by your health care provider. These may include nasal sprays.   If you were prescribed an antibiotic medicine, take it as told by your health care provider. Do not stop taking the antibiotic even if you start to feel better.  Hydrate and humidify     Drink enough fluid to keep your urine pale yellow. Staying hydrated will help to thin your mucus.   Use a cool mist humidifier to keep the humidity level in your home above 50%.   Inhale steam for 10-15 minutes, 3-4 times a day, or as told by your health care provider. You can do this in the bathroom while a hot shower is  running.   Limit your exposure to cool or dry air.  Rest   Rest as much as possible.   Sleep with your head raised (elevated).   Make sure you get enough sleep each night.  General instructions     Apply a warm, moist washcloth to your face 3-4 times a day or as told by your health care provider. This will help with discomfort.   Wash your hands often with soap and water to reduce your exposure to germs. If soap and water are not available, use hand sanitizer.   Do not smoke. Avoid being around people who are smoking (secondhand smoke).   Keep all follow-up visits as told by your health care provider. This is important.  Contact a health care provider if:   You have a fever.   Your symptoms get worse.   Your symptoms do not improve within 10 days.  Get help right away if:   You have a severe headache.   You have persistent vomiting.   You have severe pain or swelling around your face or eyes.   You have vision problems.   You develop confusion.   Your neck is stiff.   You have trouble breathing.  Summary   Sinusitis is soreness and inflammation of your sinuses. Sinuses are hollow spaces in the bones around your face.   This condition is caused by nasal tissues that become inflamed or swollen. The swelling traps or blocks the flow of mucus. This allows bacteria, viruses, and fungi to grow, which leads to infection.   If you were prescribed an antibiotic medicine, take it as told by your health care provider. Do not stop taking the antibiotic even if you start to feel better.   Keep all follow-up visits as told by your health care provider. This is important.  This information is not intended to replace advice given to you by your health care provider. Make sure you discuss any questions you have with your health care provider.  Document Released: 03/01/2005 Document Revised: 08/01/2017 Document Reviewed: 08/01/2017  Elsevier Interactive Patient Education  2019 [employer] Inc[employer]

## 2018-03-14 NOTE — Progress Notes (Signed)
Subjective:     Patient ID: Karen Hall, female   DOB: [birth year], 62 y.o.   MRN: [id number]  HPI Patient seen for acute illness.  Onset about 15 days ago.  She has had upper respiratory congestion and productive cough.  For the past several days has had some left maxillary and frontal sinus pressure.  Frequent greenish discharge from nose.  She feels that she is getting worse instead of better.  She thinks she has had some low-grade fevers intermittently.  She is tried several over-the-counter medications without improvement.  Past Medical History:  Diagnosis Date  . Allergy   . ANXIETY 10/18/2007   Qualifier: Diagnosis of  By: Sarajane Jews MD, Ishmael Holter   . Cataract    "beginnings of"  . Chronic low back pain   . Diabetes ([place]) 02/24/2016  . GERD 10/18/2007   Qualifier: Diagnosis of  By: Sherlynn Stalls, CMA, [place]    . Hyperlipemia 03/12/2014  . HYPERTENSION 10/18/2007   Qualifier: Diagnosis of  By: Sherlynn Stalls, CMA, [place]    . Insomnia 03/12/2014  . Irritable bowel syndrome 10/18/2007   Qualifier: Diagnosis of  By: Sarajane Jews MD, Ishmael Holter   . Osteopenia   . Recurrent knee pain    R knee hx meniscal tear 2002 with repair   Past Surgical History:  Procedure Laterality Date  . BREAST EXCISIONAL BIOPSY Left    Fibroid removed, 24 years ago   . BREAST SURGERY     fibroid  . CHOLECYSTECTOMY    . COLONOSCOPY    . FOOT SURGERY Right   . LIVER BIOPSY  02/2018  . MENISCUS REPAIR Right   . TONSILLECTOMY      reports that she has never smoked. She has never used smokeless tobacco. She reports current alcohol use. She reports that she does not use drugs. family history includes Breast cancer in her mother; Cancer in her maternal grandfather and mother; Diabetes in her maternal aunt and sister; Heart disease in her paternal grandfather; Hyperlipidemia in her mother; Liver cancer in her father. Allergies  Allergen Reactions  . Aspartame And Phenylalanine Other (See Comments)    Headache  . Codeine Nausea Only      Review of Systems  Constitutional: Positive for fatigue and fever.  HENT: Positive for congestion, sinus pressure and sinus pain.   Respiratory: Positive for cough. Negative for shortness of breath and wheezing.   Cardiovascular: Negative for chest pain.       Objective:   Physical Exam Constitutional:      Appearance: Normal appearance.  HENT:     Right Ear: Tympanic membrane normal.     Left Ear: Tympanic membrane normal.     Nose: Rhinorrhea present.     Mouth/Throat:     Pharynx: No oropharyngeal exudate or posterior oropharyngeal erythema.  Neck:     Musculoskeletal: Normal range of motion.  Cardiovascular:     Rate and Rhythm: Normal rate and regular rhythm.  Pulmonary:     Effort: Pulmonary effort is normal.     Breath sounds: Normal breath sounds.  Lymphadenopathy:     Cervical: No cervical adenopathy.  Neurological:     Mental Status: She is alert.        Assessment:     Probable acute left frontal and maxillary sinusitis    Plan:     -Given duration of symptoms and symptom worsening start Augmentin 875 mg twice daily with food -Continue good hydration -Follow-up for any persistent or worsening symptoms    Dan Europe MD [facility]

## 2018-03-17 ENCOUNTER — Encounter: Payer: Self-pay | Admitting: Family Medicine

## 2018-03-20 ENCOUNTER — Ambulatory Visit: Payer: BLUE CROSS/BLUE SHIELD | Admitting: Family Medicine

## 2018-03-20 NOTE — Progress Notes (Signed)
I sent a referral for you to the hepatology clinic here in [place].  They will contact you directly with an appt.  Please let me know if you have any questions.

## 2018-03-21 ENCOUNTER — Ambulatory Visit (INDEPENDENT_AMBULATORY_CARE_PROVIDER_SITE_OTHER): Payer: BLUE CROSS/BLUE SHIELD | Admitting: Gastroenterology

## 2018-03-21 DIAGNOSIS — Z23 Encounter for immunization: Secondary | ICD-10-CM | POA: Diagnosis not present

## 2018-03-24 ENCOUNTER — Telehealth: Payer: Self-pay

## 2018-03-24 NOTE — Telephone Encounter (Signed)
Dr. Maudie Mercury - Please advise on pt's request to transfer care. Thanks!

## 2018-03-24 NOTE — Telephone Encounter (Signed)
Copied from [place] [id number]. Topic: Appointment Scheduling - Transfer of Care >> Mar 24, 2018  3:59 PM Windy Kalata wrote: Pt is requesting to transfer FROM: Karen Hall Pt is requesting to transfer TO: Karen Hall Reason for requested transfer: Has moved to [place] to patient's current PCP (transferring FROM).

## 2018-03-25 NOTE — Telephone Encounter (Signed)
Ok

## 2018-03-27 ENCOUNTER — Ambulatory Visit (INDEPENDENT_AMBULATORY_CARE_PROVIDER_SITE_OTHER): Payer: BLUE CROSS/BLUE SHIELD | Admitting: Gastroenterology

## 2018-03-27 ENCOUNTER — Encounter: Payer: Self-pay | Admitting: Gastroenterology

## 2018-03-27 VITALS — BP 104/84 | HR 94 | Ht 65.5 in | Wt 196.2 lb

## 2018-03-27 DIAGNOSIS — R05 Cough: Secondary | ICD-10-CM | POA: Diagnosis not present

## 2018-03-27 DIAGNOSIS — K449 Diaphragmatic hernia without obstruction or gangrene: Secondary | ICD-10-CM

## 2018-03-27 DIAGNOSIS — K219 Gastro-esophageal reflux disease without esophagitis: Secondary | ICD-10-CM | POA: Diagnosis not present

## 2018-03-27 DIAGNOSIS — K754 Autoimmune hepatitis: Secondary | ICD-10-CM | POA: Diagnosis not present

## 2018-03-27 DIAGNOSIS — R053 Chronic cough: Secondary | ICD-10-CM

## 2018-03-27 NOTE — Telephone Encounter (Signed)
Please contact pt to schedule.   

## 2018-03-27 NOTE — Telephone Encounter (Signed)
OK w me.  

## 2018-03-27 NOTE — Patient Instructions (Signed)
If you are age 65 or older, your body mass index should be between 23-30. Your Body mass index is 32.16 kg/m. If this is out of the aforementioned range listed, please consider follow up with your Primary Care Provider.  If you are age 54 or younger, your body mass index should be between 19-25. Your Body mass index is 32.16 kg/m. If this is out of the aformentioned range listed, please consider follow up with your Primary Care Provider.   Please call our office at [phone] to set up TIF Procedure after 07/2018.  It was a pleasure to see you today!  Vito Cirigliano, D.O.

## 2018-03-27 NOTE — Progress Notes (Signed)
P  Chief Complaint:    GERD, discussion of TIF  HPI:    Patient is a 63 y.o. female with a longstanding history of reflux, referred to me by Dr. Havery Moros for discussion of Transoral Incision was Fundoplication (TIF).  She states that she has had reflux symptoms for >20 years, having treated with multiple antacids and acid suppression agents over the years.  Index symptoms of belching, sourbrash, regurgitation, along with chronic non-productive cough. Regurgitation less pronounced. No dysphagia, odynophagia. She is currently prescribed Protonix 20 mg p.o. twice daily with overall clinical improvement, but ongoing chronic cough, and she desires to discuss endoscopic antireflux surgery as a means of stopping or significantly reducing these medications.  Sxs exacrbated by pork foods, spicy foods, some tomato based sauces.  Avoids eating within 3 hours of bedtime, +HOB elevation. Will have breakthough with missed doses of Prilosec (can miss 1, but not 2).   Can have post tussive emesis.   Evaluated by Pulmonary Clinic for chronic cough in 12/2015 diagnosed as 2/2 chronic reflux with perhaps some seasonal allergies contributing to reactive airway disease. Prescribed Singulair and Symbicort along with increased acid suppression and referral back to GI at that time.   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  She has been referred by Dr. Havery Moros to the Hepatology clinic with appointment pending.  Recent labs notable for normal CBC, INR, iron panel  Endoscopic history: - EGD (02/15/2018, Dr. Havery Moros): 1 cm hiatal hernia with otherwise normal esophagus, normal stomach and duodenum.  - Barium Esophagram (07/2015): 13 mm barium tablet passes easily. Full column gastroesophageal reflux observed.   Review of systems:     No chest pain, no SOB, no fevers, no urinary sx   Past Medical History:  Diagnosis Date  .  Allergy   . ANXIETY 10/18/2007   Qualifier: Diagnosis of  By: Sarajane Jews MD, Ishmael Holter   . Cataract    "beginnings of"  . Chronic low back pain   . Diabetes ([place]) 02/24/2016  . GERD 10/18/2007   Qualifier: Diagnosis of  By: Sherlynn Stalls, CMA, [place]    . Hyperlipemia 03/12/2014  . HYPERTENSION 10/18/2007   Qualifier: Diagnosis of  By: Sherlynn Stalls, CMA, [place]    . Insomnia 03/12/2014  . Irritable bowel syndrome 10/18/2007   Qualifier: Diagnosis of  By: Sarajane Jews MD, Ishmael Holter   . Osteopenia   . Recurrent knee pain    R knee hx meniscal tear 2002 with repair    Patient's surgical history, family medical history, social history, medications and allergies were all reviewed in Epic    Current Outpatient Medications  Medication Sig Dispense Refill  . atorvastatin (LIPITOR) 10 MG tablet Take 1 tablet (10 mg total) by mouth daily. 90 tablet 3  . Cholecalciferol (VITAMIN D3 PO) Take 1,000 Units by mouth 2 (two) times daily.    . citalopram (CELEXA) 20 MG tablet Take 1 tablet (20 mg total) by mouth daily. 90 tablet 3  . diphenhydrAMINE-APAP, sleep, (TYLENOL PM EXTRA STRENGTH PO) Take by mouth daily.     . Homeopathic Products (ZICAM ALLERGY RELIEF NA) Place into the nose as needed.    Marland Kitchen losartan-hydrochlorothiazide (HYZAAR) 50-12.5 MG tablet Take 1 tablet by mouth daily. 90 tablet 1  . MELATONIN PO Take by mouth daily.     Marland Kitchen omeprazole (PRILOSEC) 20 MG capsule Take 1 capsule (20 mg total) by mouth 2 (two) times daily before a meal. 180  capsule 3  . diphenhydrAMINE HCl (BENADRYL ALLERGY PO) Take 1 tablet by mouth as needed (usuallu spring and fall).     No current facility-administered medications for this visit.     Physical Exam:     Ht 5' 5.5" (1.664 m)   Wt 196 lb 4 oz (89 kg)   BMI 32.16 kg/m   GENERAL:  Pleasant female in NAD PSYCH: : Cooperative, normal affect EENT:  conjunctiva pink, mucous membranes moist, neck supple without masses CARDIAC:  RRR, no murmur heard, no peripheral edema PULM: Normal  respiratory effort, lungs CTA bilaterally, no wheezing ABDOMEN:  Nondistended, soft, nontender. No obvious masses, no hepatomegaly,  normal bowel sounds SKIN:  turgor, no lesions seen Musculoskeletal:  Normal muscle tone, normal strength NEURO: Alert and oriented x 3, no focal neurologic deficits   IMPRESSION and PLAN:    #1. GERD: Karen Hall is a 63 y.o. female with a long-standing history of GERD with significant refluxate noted on barium esophagram, presenting to discuss endoscopic antireflux surgery with goal of improved/resolved symptoms, and stopping or significantly reducing acid suppression medications. Discussed the pathophysiology of GERD at length, to include the risks of untreated reflux (ie, strictures, Barrett's Esophagus, EAC, etc) as well as the possible treatment with medications vs antireflux surgery. In particular, we discussed the risks, benefits, and alternatives of Transoral Incisionless Fundoplication (TIF), to include Nissen fundoplication, and the patient wishes to proceed with TIF.   Of note, she is scheduled to change from her current insurer to [facility] in May, and would like to schedule for this elective procedure after that change.   - Will schedule as above for TIF at [facility] in the Spring with plan for subsequent admission overnight for post-operative observation  - In the meantime, to resume Prilosec as prescribed - NPO at [facility] prior to the procedure - Confirmed allergies with the patient and no allergies to planned perioperative antibiotics - Reviewed postoperative dietary and activity restrictions with patient and provided handout - Anticipated 23 hour stay  - Additional recommendations regarding medications and post-operative diet to follow TIF completion  - All questions answered   #2.  Chronic Cough: Secondary to the above chronic GERD with extraesophageal manifestations. Will plan to treat during admission in order to minimize post  operative risk of suture disruption. Additionally, can send to Pulmonary prior to procedure if sxs worsen in interim.   #3. Hiatal Hernia: 1 cm HH on recent EGD. Will plan to reduce and correct at time of TIF as above. Reviewe EGD images myself and o/w Hill Grade 1-2 valve noted on retroflexed images.   #4. Autoimmune Hepatitis: Recently diagnosed AIH by serologies and liver bx. Referral already placed for Hepatology appt.   I spent a total of 25 minutes of face-to-face time with the patient. Greater than 50% of the time was spent counseling and coordinating care.   Cc: [place], MD      Lavena Bullion ,DO, [facility] 03/27/2018, 2:57 PM

## 2018-04-21 ENCOUNTER — Encounter: Payer: Self-pay | Admitting: Family Medicine

## 2018-04-21 ENCOUNTER — Ambulatory Visit (INDEPENDENT_AMBULATORY_CARE_PROVIDER_SITE_OTHER): Payer: BLUE CROSS/BLUE SHIELD | Admitting: Family Medicine

## 2018-04-21 VITALS — BP 118/72 | HR 91 | Temp 97.5°F | Ht 65.5 in | Wt 195.5 lb

## 2018-04-21 DIAGNOSIS — G4700 Insomnia, unspecified: Secondary | ICD-10-CM

## 2018-04-21 MED ORDER — TRAZODONE HCL 50 MG PO TABS: 25.0000 mg | ORAL_TABLET | Freq: Every evening | ORAL | 3 refills | Status: DC | PRN

## 2018-04-21 NOTE — Progress Notes (Signed)
Chief Complaint  Patient presents with  . New Patient (Initial Visit)    Subjective: Patient is a 63 y.o. female here for transition of care.  4-5 years has had issues falling asleep. Drinks caffeine before 1 PM. Able to stay asleep. Taking Benadryl and melatonin without relief. Tries to go to bed round midnight, wake up around 10 AM. Takes at least 1 hr to fall asleep. No alcohol or naps. Used Ambien intermittently in the past. Has also used Valerian root. Feels mind is racing and she is not particularly worried. Has hx of GAD, controlled on Celexa 20 mg/d.  ROS: Psych: +insomnia  Past Medical History:  Diagnosis Date  . Allergy   . ANXIETY 10/18/2007   Qualifier: Diagnosis of  By: Sarajane Jews MD, Ishmael Holter   . Autoimmune hepatitis ([place])   . Cataract    "beginnings of"  . Chronic low back pain   . Diabetes ([place]) 02/24/2016  . GERD 10/18/2007   Qualifier: Diagnosis of  By: Sherlynn Stalls, CMA, [place]    . Hyperlipemia 03/12/2014  . HYPERTENSION 10/18/2007   Qualifier: Diagnosis of  By: Sherlynn Stalls, CMA, [place]    . Insomnia 03/12/2014  . Irritable bowel syndrome 10/18/2007   Qualifier: Diagnosis of  By: Sarajane Jews MD, Ishmael Holter   . Osteopenia   . Recurrent knee pain    R knee hx meniscal tear 2002 with repair    Objective: BP 118/72 (BP Location: Left Arm, Patient Position: Sitting, Cuff Size: Normal)   Pulse 91   Temp (!) 97.5 F (36.4 C) (Oral)   Ht 5' 5.5" (1.664 m)   Wt 195 lb 8 oz (88.7 kg)   SpO2 96%   BMI 32.04 kg/m  General: Awake, appears stated age Heart: RRR, no LE edema Lungs: CTAB, no rales, wheezes or rhonchi. No accessory muscle use Psych: Age appropriate judgment and insight, normal affect and mood  Assessment and Plan: Insomnia, unspecified type - Plan: traZODone (DESYREL) 50 MG tablet  Orders as above. Sleepy hygiene discussed. CBT/counseling info given. F/u in 6 weeks.  The patient voiced understanding and agreement to the plan.  [place], DO 04/21/18  1:46  PM

## 2018-04-21 NOTE — Patient Instructions (Addendum)
Sleep is important to [place] all. Getting good sleep is imperative to adequate functioning during the day. Work with our counselors who are trained to help people obtain quality sleep. Call [phone] to schedule an appointment or if you are curious about insurance coverage/cost.  Sleep Hygiene Tips:  Do not watch TV or look at screens within 1 hour of going to bed. If you do, make sure there is a blue light filter (nighttime mode) involved.  Try to go to bed around the same time every night. Wake up at the same time within 1 hour of regular time. Ex: If you wake up at 7 AM for work, do not sleep past 8 AM on days that you don't work.  Do not drink alcohol before bedtime.  Do not consume caffeine-containing beverages after noon or within 9 hours of intended bedtime.  Get regular exercise/physical activity in your life, but not within 2 hours of planned bedtime.  Do not take naps.   Do not eat within 2 hours of planned bedtime.  The bed should be for sleep or sex only. If after 20-30 minutes you are unable to fall asleep, get up and do something relaxing. Do this until you feel ready to go to sleep again.   Let us know if you need anything.

## 2018-04-28 ENCOUNTER — Other Ambulatory Visit: Payer: Self-pay | Admitting: Family Medicine

## 2018-05-08 ENCOUNTER — Other Ambulatory Visit: Payer: Self-pay | Admitting: Family Medicine

## 2018-05-08 NOTE — Telephone Encounter (Signed)
Requested medication (s) are due for refill today: yes  Requested medication (s) are on the active medication list: yes  Last refill:  Last refilled by Dr. Maudie Mercury  Future visit scheduled: yes  Notes to clinic:  Unable to refill per protocol. Medications last filled by Dr. Maudie Mercury and are now expired    Requested Prescriptions  Pending Prescriptions Disp Refills   atorvastatin (LIPITOR) 10 MG tablet 90 tablet 3    Sig: Take 1 tablet (10 mg total) by mouth daily.     Cardiovascular:  Antilipid - Statins Failed - 05/08/2018 12:51 PM      Failed - Total Cholesterol in normal range and within 360 days    Cholesterol  Date Value Ref Range Status  04/12/2017 135 0 - 200 mg/dL Final    Comment:    ATP III Classification       Desirable:  < 200 mg/dL               Borderline High:  200 - 239 mg/dL          High:  > = 240 mg/dL         Failed - LDL in normal range and within 360 days    LDL Cholesterol  Date Value Ref Range Status  04/12/2017 58 0 - 99 mg/dL Final         Failed - HDL in normal range and within 360 days    HDL  Date Value Ref Range Status  04/12/2017 39.80 >39.00 mg/dL Final         Failed - Triglycerides in normal range and within 360 days    Triglycerides  Date Value Ref Range Status  04/12/2017 185.0 (H) 0.0 - 149.0 mg/dL Final    Comment:    Normal:  <150 mg/dLBorderline High:  150 - 199 mg/dL         Passed - Patient is not pregnant      Passed - Valid encounter within last 12 months    Recent Outpatient Visits          2 weeks ago Insomnia, unspecified type   Archivist at [facility], [place], DO   1 month ago Acute frontal sinusitis, recurrence not specified   Therapist, music at [facility], Alinda Sierras, MD   4 months ago Type 2 diabetes mellitus with other specified complication, without long-term current use of insulin ([place])   Therapist, music at [facility], [place], DO   7 months ago  Essential hypertension   Therapist, music at [facility], [place], DO   1 year ago [place] at [facility], [place], DO      Future Appointments            In 3 weeks [place], [place], [place] at [facility], [facility]          citalopram (CELEXA) 20 MG tablet 90 tablet 3    Sig: Take 1 tablet (20 mg total) by mouth daily.     Psychiatry:  Antidepressants - SSRI Passed - 05/08/2018 12:51 PM      Passed - Valid encounter within last 6 months    Recent Outpatient Visits          2 weeks ago Insomnia, unspecified type   Archivist at [place], [place]   1 month ago Acute frontal sinusitis, recurrence not  specified   Therapist, music at [facility], Alinda Sierras, MD   4 months ago Type 2 diabetes mellitus with other specified complication, without long-term current use of insulin ([place])   Therapist, music at [facility], [place], DO   7 months ago Essential hypertension   Therapist, music at [facility], Nickola Major, DO   1 year ago [place] at [facility], Nickola Major, DO      Future Appointments            In 3 weeks [place], [place], [place] at [facility], [facility]

## 2018-05-08 NOTE — Telephone Encounter (Signed)
Copied from [place] [id number]. Topic: Quick Communication - Rx Refill/Question >> May 08, 2018 11:00 AM Burchel, Abbi R wrote: Medication: atorvastatin (LIPITOR) 10 MG tablet, citalopram (CELEXA) 20 MG tablet   Preferred Pharmacy: [id number] [place], [place] [address] AT [place] [address] Phone: [phone] Fax: [phone] Not a 24 hour pharmacy; exact hours not known.   Pt was advised that RX refills may take up to 3 business days. We ask that you follow-up with your pharmacy.

## 2018-05-09 MED ORDER — CITALOPRAM HYDROBROMIDE 20 MG PO TABS: 20.0000 mg | ORAL_TABLET | Freq: Every day | ORAL | 3 refills | Status: DC

## 2018-05-09 MED ORDER — ATORVASTATIN CALCIUM 10 MG PO TABS: 10.0000 mg | ORAL_TABLET | Freq: Every day | ORAL | 3 refills | Status: DC

## 2018-05-13 ENCOUNTER — Other Ambulatory Visit: Payer: Self-pay | Admitting: Family Medicine

## 2018-05-13 DIAGNOSIS — G4700 Insomnia, unspecified: Secondary | ICD-10-CM

## 2018-05-22 ENCOUNTER — Telehealth: Payer: Self-pay

## 2018-05-22 ENCOUNTER — Other Ambulatory Visit: Payer: Self-pay

## 2018-05-22 NOTE — Telephone Encounter (Signed)
Thanks for letting me know, not sure why she has not followed through with this. I left her a message via Mychart to contact me for reassessment if she is not going to follow through with this referral which is quite important for her.

## 2018-05-22 NOTE — Telephone Encounter (Signed)
Referral was sent to [place] on 03/20/18. [facility] made several attempts to reach patient on 03/24/18, 04/01/18, 04/04/18 and mailed her a letter to which I have a copy.  The patient was asked in the letter to please contact them to schedule the appt. As of today [place] has not had a returned call from the patient. [facility]  Has closed the referral.  I have reached out to the patient and have not had any response as well.

## 2018-05-22 NOTE — Telephone Encounter (Signed)
Thanks Sheri, If that is the case I think okay to wait 2 more months. It would be good for her to have recent LFTs since we have not seen her in a bit, if she can go to the lab at her convenience for a blood draw. Thanks

## 2018-05-22 NOTE — Telephone Encounter (Signed)
See [employer] message.  She plans to wait until she has Medicare in May.  Will need to be referred again at that time.

## 2018-05-23 NOTE — Telephone Encounter (Signed)
My Chart message sent to the patient.

## 2018-06-02 ENCOUNTER — Ambulatory Visit: Payer: BLUE CROSS/BLUE SHIELD | Admitting: Family Medicine

## 2018-06-11 IMAGING — US US ABDOMEN LIMITED
1 series · 14 of 25 positions shown · non-contrast
Comparison: CT chest 02/20/2015.

CLINICAL DATA: Right upper quadrant pain. Elevated alkaline
phosphatase.

EXAM:
ULTRASOUND ABDOMEN LIMITED RIGHT UPPER QUADRANT

[Series 1: us abdomen limited · 0.17mm/px · 14 of 44 slices shown]
[im 1/44]
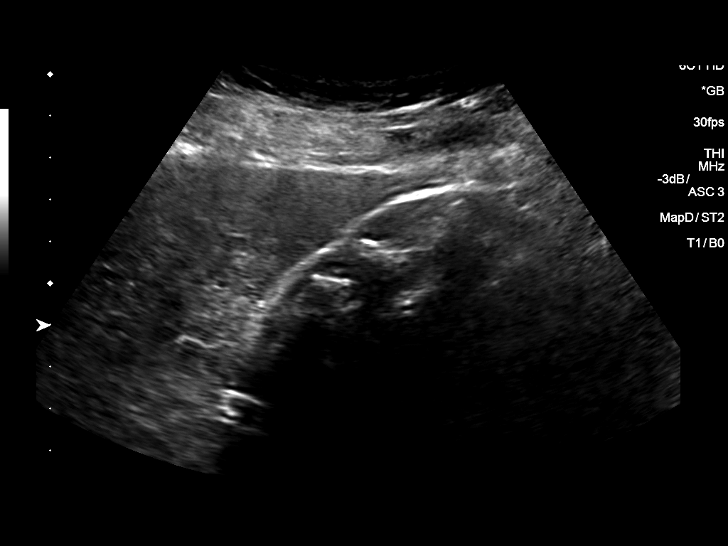
[im 4/44]
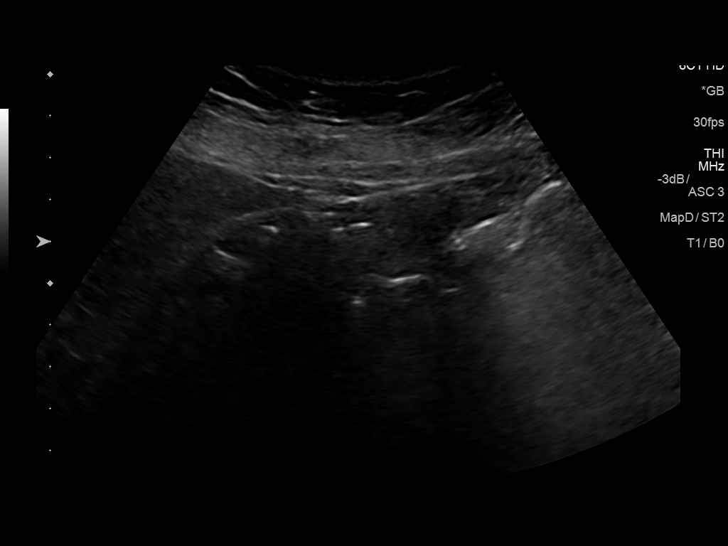
[im 8/44]
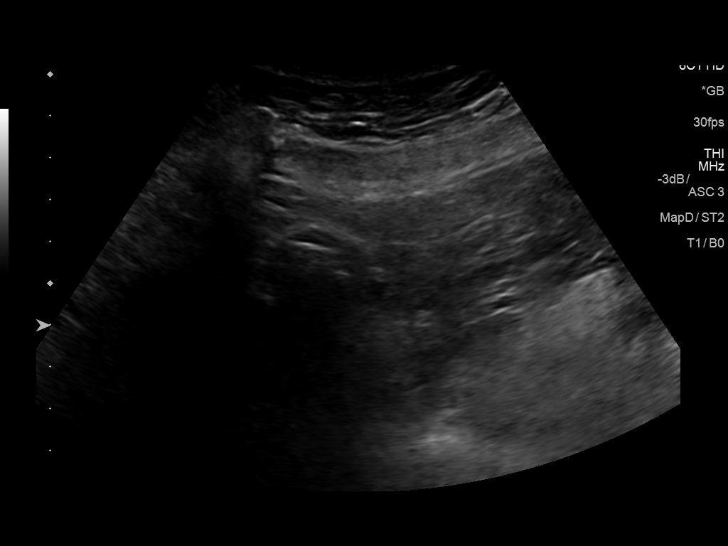
[im 11/44]
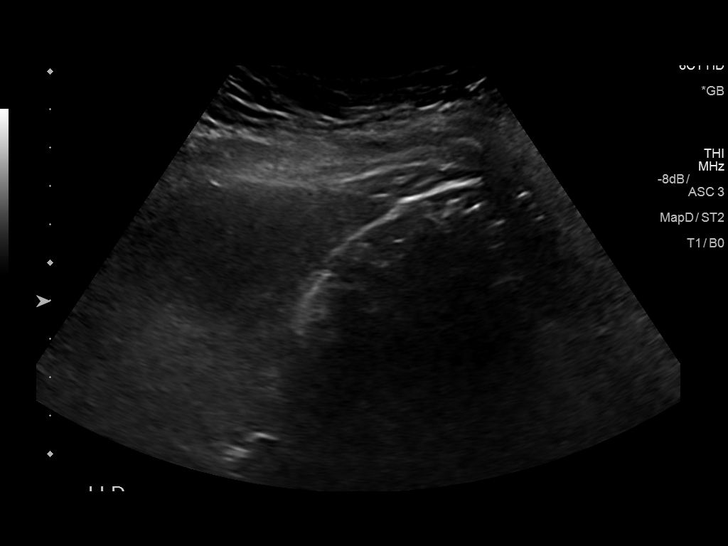
[im 15/44]
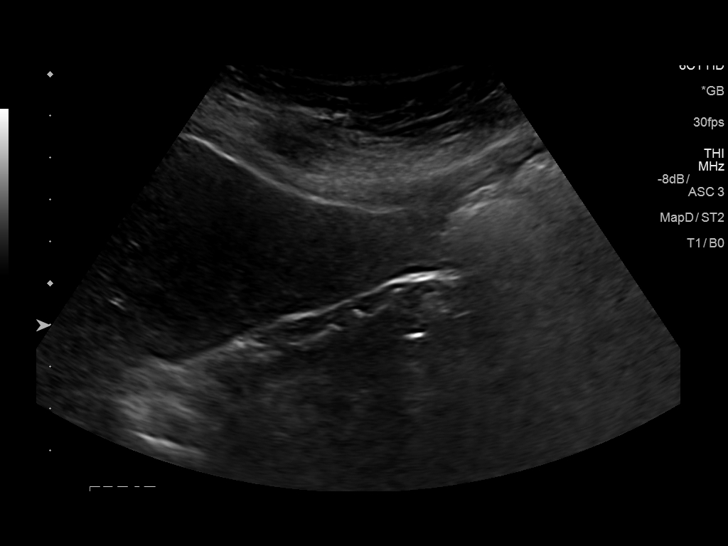
[im 17/44]
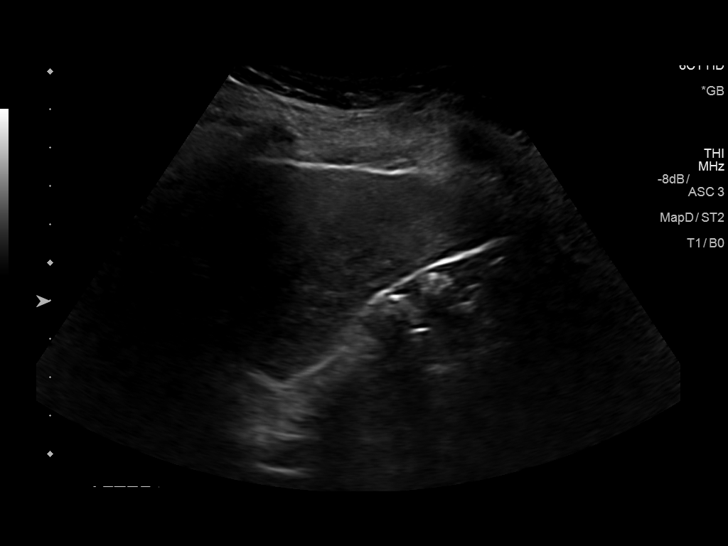
[im 20/44]
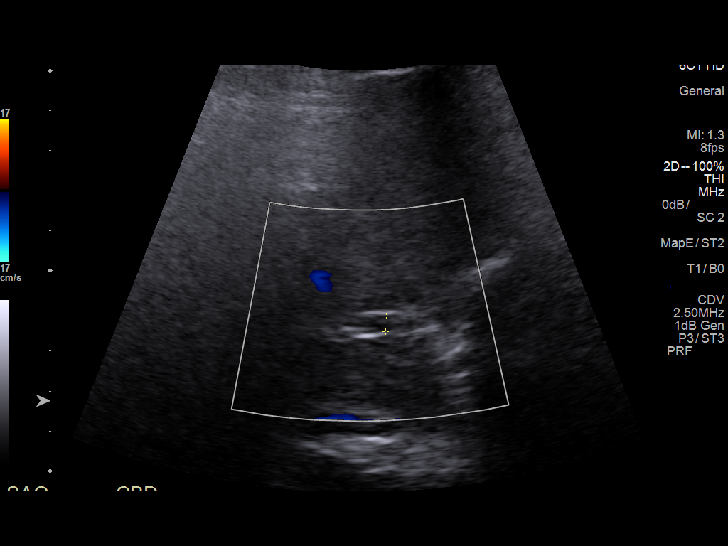
[im 24/44]
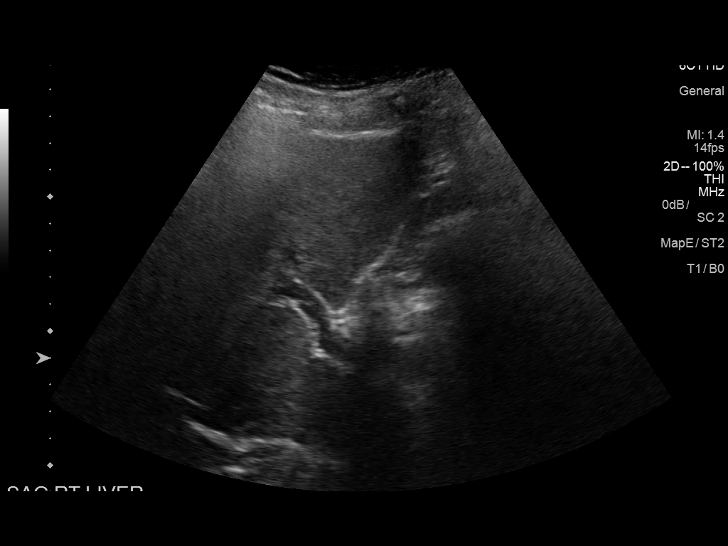
[im 27/44]
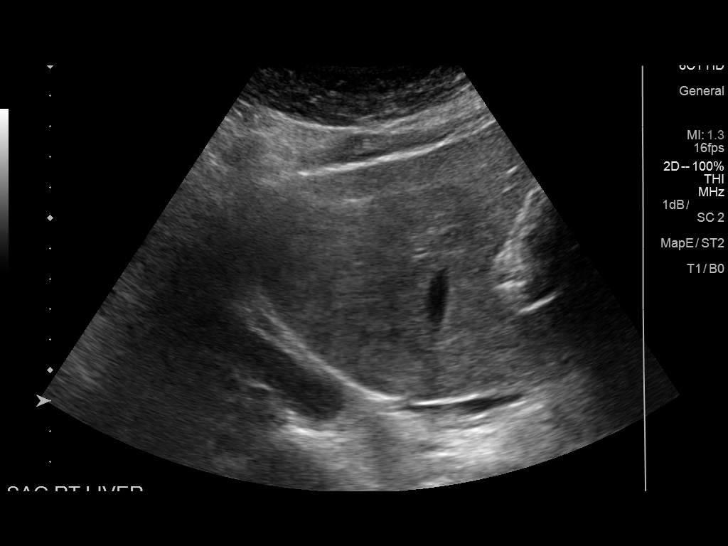
[im 29/44]
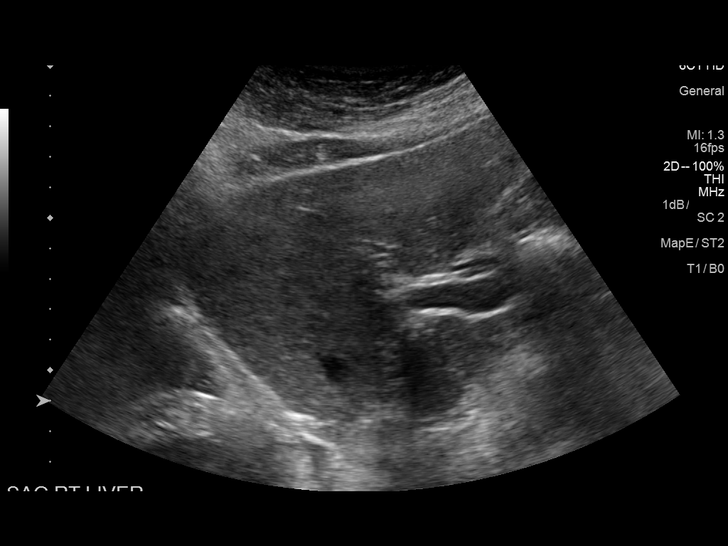
[im 33/44]
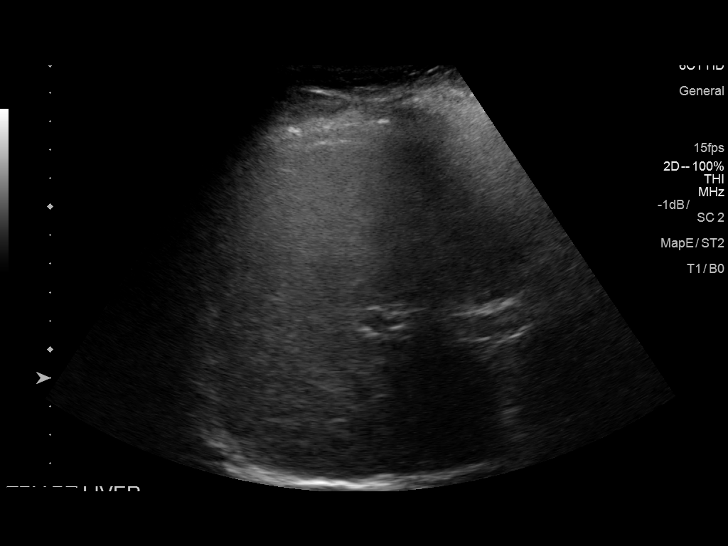
[im 36/44]
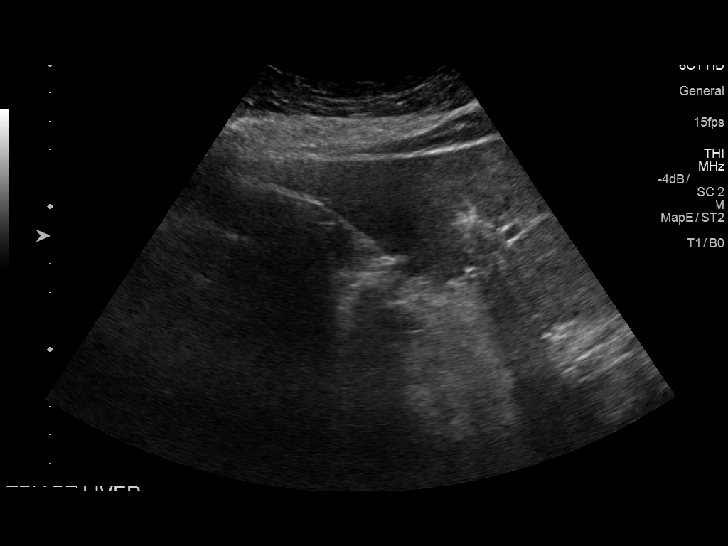
[im 40/44]
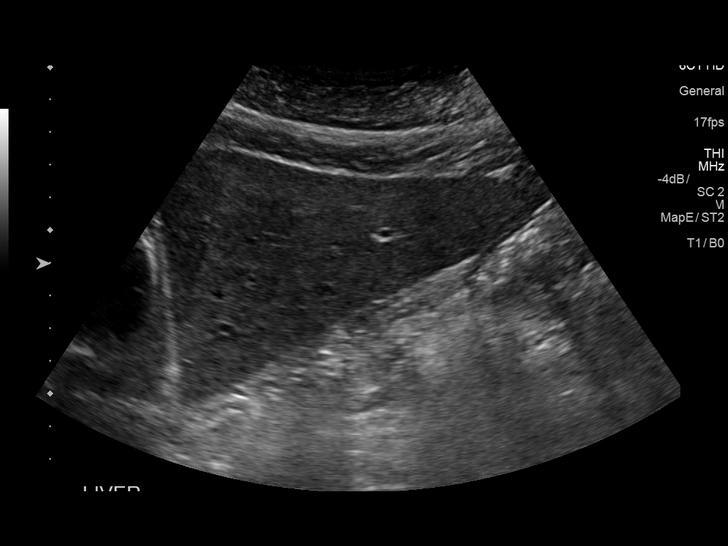
[im 44/44]
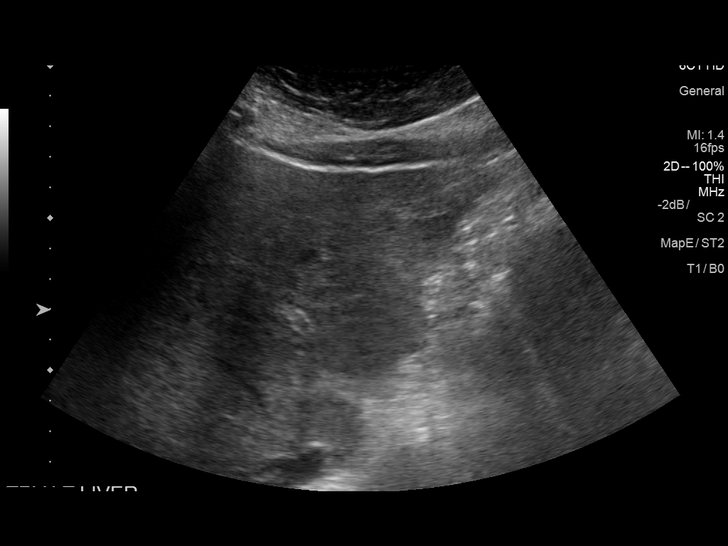

[14 of 25 positions shown; findings below may reference images not displayed]

FINDINGS: Gallbladder:

Multiple non mobile gallstones noted. Gallbladder wall thickness
mm. Positive Murphy sign.

Common bile duct:

Diameter: Heterogeneous hepatic parenchymal pattern consistent with
fatty infiltration and/or hepatocellular disease.

Liver:

Heterogeneous hepatic parenchymal pattern consistent with fatty
infiltration and/or hepatocellular disease. Portal vein is patent on
color Doppler imaging with normal direction of blood flow towards
the liver.
IMPRESSION: 1. Multiple non mobile gallstones. Gallbladder wall thickness
mm. Positive Murphy sign. No biliary distention.

2. Heterogeneous hepatic parenchymal pattern consistent fatty
infiltration or hepatocellular disease.

## 2018-06-13 ENCOUNTER — Other Ambulatory Visit: Payer: Self-pay | Admitting: Family Medicine

## 2018-07-26 ENCOUNTER — Other Ambulatory Visit: Payer: Self-pay

## 2018-07-26 DIAGNOSIS — K754 Autoimmune hepatitis: Secondary | ICD-10-CM

## 2018-07-27 ENCOUNTER — Other Ambulatory Visit: Payer: Self-pay

## 2018-07-27 ENCOUNTER — Other Ambulatory Visit (INDEPENDENT_AMBULATORY_CARE_PROVIDER_SITE_OTHER): Payer: BLUE CROSS/BLUE SHIELD

## 2018-07-27 ENCOUNTER — Telehealth: Payer: Self-pay

## 2018-07-27 DIAGNOSIS — H2513 Age-related nuclear cataract, bilateral: Secondary | ICD-10-CM | POA: Diagnosis not present

## 2018-07-27 DIAGNOSIS — K754 Autoimmune hepatitis: Secondary | ICD-10-CM

## 2018-07-27 DIAGNOSIS — H43813 Vitreous degeneration, bilateral: Secondary | ICD-10-CM | POA: Diagnosis not present

## 2018-07-27 LAB — HEPATIC FUNCTION PANEL
ALT: 25 U/L (ref 0–35)
AST: 29 U/L (ref 0–37)
Albumin: 4.1 g/dL (ref 3.5–5.2)
Alkaline Phosphatase: 226 U/L — ABNORMAL HIGH (ref 39–117)
Bilirubin, Direct: 0.1 mg/dL (ref 0.0–0.3)
Total Bilirubin: 0.4 mg/dL (ref 0.2–1.2)
Total Protein: 7.3 g/dL (ref 6.0–8.3)

## 2018-07-27 NOTE — Telephone Encounter (Signed)
Referral made to Dr. Precious Gilding office. Message sent to patient's My Chart stating this.

## 2018-08-08 ENCOUNTER — Telehealth: Payer: Self-pay

## 2018-08-08 NOTE — Telephone Encounter (Signed)
LM for pt that she needs to be scheduled for 3rd Twinrix Monday, 6-8. Order is in.

## 2018-08-08 NOTE — Telephone Encounter (Signed)
-----   Message from Karen Hall, [place] sent at 03/21/2018  1:13 PM EST ----- Pt is due for 3rd and final standard twinrix 08/19/2018

## 2018-08-15 NOTE — Telephone Encounter (Signed)
LM for pt to call and schedule nurse visit for Monday, 6-8 for 3rd and final Twinrix/[employer].

## 2018-08-16 DIAGNOSIS — H52222 Regular astigmatism, left eye: Secondary | ICD-10-CM | POA: Diagnosis not present

## 2018-08-16 DIAGNOSIS — H25812 Combined forms of age-related cataract, left eye: Secondary | ICD-10-CM | POA: Diagnosis not present

## 2018-09-04 DIAGNOSIS — H2511 Age-related nuclear cataract, right eye: Secondary | ICD-10-CM | POA: Diagnosis not present

## 2018-09-06 DIAGNOSIS — H52221 Regular astigmatism, right eye: Secondary | ICD-10-CM | POA: Diagnosis not present

## 2018-09-06 DIAGNOSIS — H25811 Combined forms of age-related cataract, right eye: Secondary | ICD-10-CM | POA: Diagnosis not present

## 2018-09-13 HISTORY — PX: CATARACT EXTRACTION: SUR2

## 2018-09-18 DIAGNOSIS — H43813 Vitreous degeneration, bilateral: Secondary | ICD-10-CM | POA: Diagnosis not present

## 2018-09-22 ENCOUNTER — Telehealth: Payer: Self-pay

## 2018-09-22 NOTE — Telephone Encounter (Signed)
Left message for patient to call back, to get more info

## 2018-09-25 ENCOUNTER — Encounter (INDEPENDENT_AMBULATORY_CARE_PROVIDER_SITE_OTHER): Payer: Medicare Other | Admitting: Ophthalmology

## 2018-09-25 ENCOUNTER — Other Ambulatory Visit: Payer: Self-pay

## 2018-09-25 DIAGNOSIS — H35033 Hypertensive retinopathy, bilateral: Secondary | ICD-10-CM | POA: Diagnosis not present

## 2018-09-25 DIAGNOSIS — I10 Essential (primary) hypertension: Secondary | ICD-10-CM | POA: Diagnosis not present

## 2018-09-25 DIAGNOSIS — H43813 Vitreous degeneration, bilateral: Secondary | ICD-10-CM

## 2018-09-26 ENCOUNTER — Telehealth: Payer: Self-pay

## 2018-09-26 NOTE — Telephone Encounter (Signed)
Attempted again to contact patient reference Hepatology referral. Left message again to please call us back

## 2018-10-10 DIAGNOSIS — Z961 Presence of intraocular lens: Secondary | ICD-10-CM | POA: Diagnosis not present

## 2018-11-23 ENCOUNTER — Telehealth: Payer: Self-pay

## 2018-11-23 NOTE — Telephone Encounter (Signed)
Attempted to call patient back,( reference her message via My Chart that Dr. Precious Gilding office does not have a referral for her) left message that there are several documented times over the past couple of weeks that they have tried to reach her and that they do have her referral from [place], to please call them at [phone]

## 2018-11-27 ENCOUNTER — Encounter (INDEPENDENT_AMBULATORY_CARE_PROVIDER_SITE_OTHER): Payer: Medicare Other | Admitting: Ophthalmology

## 2018-11-27 ENCOUNTER — Other Ambulatory Visit: Payer: Self-pay

## 2018-11-27 DIAGNOSIS — I10 Essential (primary) hypertension: Secondary | ICD-10-CM | POA: Diagnosis not present

## 2018-11-27 DIAGNOSIS — H35033 Hypertensive retinopathy, bilateral: Secondary | ICD-10-CM | POA: Diagnosis not present

## 2018-11-27 DIAGNOSIS — H43813 Vitreous degeneration, bilateral: Secondary | ICD-10-CM

## 2018-11-29 ENCOUNTER — Other Ambulatory Visit: Payer: Self-pay

## 2018-11-29 ENCOUNTER — Encounter: Payer: Self-pay | Admitting: Family Medicine

## 2018-11-29 ENCOUNTER — Ambulatory Visit (INDEPENDENT_AMBULATORY_CARE_PROVIDER_SITE_OTHER): Payer: Medicare Other | Admitting: Family Medicine

## 2018-11-29 VITALS — BP 128/72 | HR 121 | Temp 96.5°F | Ht 66.0 in | Wt 204.4 lb

## 2018-11-29 DIAGNOSIS — G8929 Other chronic pain: Secondary | ICD-10-CM | POA: Diagnosis not present

## 2018-11-29 DIAGNOSIS — M25571 Pain in right ankle and joints of right foot: Secondary | ICD-10-CM

## 2018-11-29 MED ORDER — LOSARTAN POTASSIUM 50 MG PO TABS: 50.0000 mg | ORAL_TABLET | Freq: Every day | ORAL | 2 refills | Status: DC

## 2018-11-29 NOTE — Patient Instructions (Signed)
Heat (pad or rice pillow in microwave) over affected area, 10-15 minutes twice daily.   Ice/cold pack over area for 10-15 min twice daily.  OK to take Tylenol 1000 mg (2 extra strength tabs) or 975 mg (3 regular strength tabs) every 6 hours as needed.  Ankle Exercises It is normal to feel mild stretching, pulling, tightness, or discomfort as you do these exercises, but you should stop right away if you feel sudden pain or your pain gets worse.  Stretching and range of motion exercises These exercises warm up your muscles and joints and improve the movement and flexibility of your ankle. These exercises also help to relieve pain, numbness, and tingling. Exercise A: Dorsiflexion/Plantar Flexion   1. Sit with your affected knee straight or bent. Do not rest your foot on anything. 2. Flex your affected ankle to tilt the top of your foot toward your shin. 3. Hold this position for 5 seconds. 4. Point your toes downward to tilt the top of your foot away from your shin. 5. Hold this position for 5 seconds. Repeat 2 times. Complete this exercise 3 times per week. Exercise B: Ankle Alphabet   1. Sit with your affected foot supported at your lower leg. ? Do not rest your foot on anything. ? Make sure your foot has room to move freely. 2. Think of your affected foot as a paintbrush, and move your foot to trace each letter of the alphabet in the air. Keep your hip and knee still while you trace. Make the letters as large as you can without increasing any discomfort. 3. Trace every letter from A to Z. Repeat 2 times. Complete this exercise 3 times per week. Strengthening exercises These exercises build strength and endurance in your ankle. Endurance is the ability to use your muscles for a long time, even after they get tired. Exercise D: Dorsiflexors   1. Secure a rubber exercise band or tube to an object, such as a table leg, that will stay still when the band is pulled. Secure the other end  around your affected foot. 2. Sit on the floor, facing the object with your affected leg extended. The band or tube should be slightly tense when your foot is relaxed. 3. Slowly flex your affected ankle and toes to bring your foot toward you. 4. Hold this position for 3 seconds.  5. Slowly return your foot to the starting position, controlling the band as you do that. Do a total of 10 repetitions. Repeat 2 times. Complete this exercise 3 times per week. Exercise E: Plantar Flexors   1. Sit on the floor with your affected leg extended. 2. Loop a rubber exercise band or tube around the ball of your affected foot. The ball of your foot is on the walking surface, right under your toes. The band or tube should be slightly tense when your foot is relaxed. 3. Slowly point your toes downward, pushing them away from you. 4. Hold this position for 3 seconds. 5. Slowly release the tension in the band or tube, controlling smoothly until your foot is back in the starting position. Repeat for a total of 10 repetitions. Repeat 2 times. Complete this exercise 3 times per week. Exercise F: Towel Curls   1. Sit in a chair on a non-carpeted surface, and put your feet on the floor. 2. Place a towel in front of your feet.  3. Keeping your heel on the floor, put your affected foot on the towel. 4. Pull  the towel toward you by grabbing the towel with your toes and curling them under. Keep your heel on the floor. 5. Let your toes relax. 6. Grab the towel again. Keep going until the towel is completely underneath your foot. Repeat for a total of 10 repetitions. Repeat 2 times. Complete this exercise 3 times per week. Exercise G: Heel Raise ( Plantar Flexors, Standing)    1. Stand with your feet shoulder-width apart. 2. Keep your weight spread evenly over the width of your feet while you rise up on your toes. Use a wall or table to steady yourself, but try not to use it for support. 3. If this exercise is  too easy, try these options: ? Shift your weight toward your affected leg until you feel challenged. ? If told by your health care provider, lift your uninjured leg off the floor. 4. Hold this position for 3 seconds. Repeat for a total of 10 repetitions. Repeat 2 times. Complete this exercise 3 times per week. Exercise H: Tandem Walking 1. Stand with one foot directly in front of the other. 2. Slowly raise your back foot up, lifting your heel before your toes, and place it directly in front of your other foot. 3. Continue to walk in this heel-to-toe way for 10 steps or for as long as told by your health care provider. Have a countertop or wall nearby to use if needed to keep your balance, but try not to hold onto anything for support. Repeat 2 times. Complete this exercises 3 times per week. Make sure you discuss any questions you have with your health care provider. Document Released: 01/13/2005 Document Revised: 10/30/2015 Document Reviewed: 11/17/2014 Elsevier Interactive Patient Education  2018 [employer].

## 2018-11-29 NOTE — Progress Notes (Signed)
Musculoskeletal Exam  Patient: Karen Hall DOB: 28-Oct-1955  DOS: 11/29/2018  SUBJECTIVE:  Chief Complaint:   Chief Complaint  Patient presents with  . Ankle Pain    Karen Hall is a 63 y.o.  female for evaluation and treatment of R ankle pain.   Onset:  10 months ago. No inj. Getting worse over past few weeks. Had issues with PF and fx'd calcaneus on R last year. Was in a boot, cannot think of any other change in activity. Location: R outer ankle Character:  aching 2/10, sometimes jumps to 4-5/10.  Progression of issue:  is moderately worse Associated symptoms: Feels a lump on R outer ankle Driving makes it worse, riding bike makes it worse.  Treatment: to date has been ice and Biofreeze.   Neurovascular symptoms: no  ROS: Musculoskeletal/Extremities: +R ankle pain  Past Medical History:  Diagnosis Date  . Allergy   . ANXIETY 10/18/2007   Qualifier: Diagnosis of  By: Sarajane Jews MD, Ishmael Holter   . Autoimmune hepatitis ([place])   . Cataract    "beginnings of"  . Chronic low back pain   . Diabetes ([place]) 02/24/2016  . GERD 10/18/2007   Qualifier: Diagnosis of  By: Sherlynn Stalls, CMA, [place]    . Hyperlipemia 03/12/2014  . HYPERTENSION 10/18/2007   Qualifier: Diagnosis of  By: Sherlynn Stalls, CMA, [place]    . Insomnia 03/12/2014  . Irritable bowel syndrome 10/18/2007   Qualifier: Diagnosis of  By: Sarajane Jews MD, Ishmael Holter   . Osteopenia   . Recurrent knee pain    R knee hx meniscal tear 2002 with repair    Objective: VITAL SIGNS: BP 128/72 (BP Location: Left Arm, Patient Position: Sitting, Cuff Size: Normal)   Pulse (!) 121   Temp (!) 96.5 F (35.8 C) (Temporal)   Ht 5\' 6"  (1.676 m)   Wt 204 lb 6 oz (92.7 kg)   SpO2 97%   BMI 32.99 kg/m  Constitutional: Well formed, well developed. No acute distress. Cardiovascular: Brisk cap refill Thorax & Lungs: No accessory muscle use Musculoskeletal: R ankle.   Normal active range of motion: yes.   Normal passive range of motion: yes Tenderness to  palpation: yes Deformity: There is an elliptical mass of soft tiss just anterior to lateral mall Ecchymosis: no Tests positive: none Tests negative: Squeeze, anterior drawer Neurologic: Normal sensory function. No focal deficits noted.  Psychiatric: Normal mood. Age appropriate judgment and insight. Alert & oriented x 3.    Assessment:  Chronic pain of right ankle  Plan: Stretches/exercises, ice, heat, Tylenol. Keep appt w podiatry team. If no intervention, would do PT in 3-4 weeks if no improvement.  F/u for CPE at convenience. The patient voiced understanding and agreement to the plan.   Laurence Harbor, DO 11/29/18  1:16 PM

## 2018-11-30 DIAGNOSIS — M25571 Pain in right ankle and joints of right foot: Secondary | ICD-10-CM | POA: Diagnosis not present

## 2018-11-30 DIAGNOSIS — G8929 Other chronic pain: Secondary | ICD-10-CM | POA: Diagnosis not present

## 2018-11-30 DIAGNOSIS — M25871 Other specified joint disorders, right ankle and foot: Secondary | ICD-10-CM | POA: Diagnosis not present

## 2019-01-05 NOTE — Telephone Encounter (Signed)
Called and left message for the patient to call back to the office and ask to speak with the nurse;

## 2019-01-08 NOTE — Telephone Encounter (Signed)
Left message for patient to call back to the office;  

## 2019-01-09 ENCOUNTER — Telehealth: Payer: Self-pay

## 2019-01-09 NOTE — Telephone Encounter (Signed)
RN has been trying to get in touch with this patient to schedule a f/u appt -unable to contact patient via phone-please follow up with this patient to get her scheduled to see MD

## 2019-01-09 NOTE — Telephone Encounter (Signed)
Left message for patient to please call back, trying to schedule an office visit with Dr Bryan Lemma for a TIF procedure

## 2019-01-10 NOTE — Telephone Encounter (Signed)
Patient returned call to the office-patient has been scheduled to see Dr. Bryan Lemma on 01/16/2019 at 3:00 pm to discuss the TIF procedure and to be scheduled for this;  Patient reports she was referred to Dr. Zollie Scale with [facility] liver care and that provider is not within her network of covered providers-can you please advise on who to refer her to-an  alternative provider for liver care-

## 2019-01-12 ENCOUNTER — Telehealth: Payer: Self-pay

## 2019-01-12 NOTE — Telephone Encounter (Signed)
Sent patient message via My Chart to please check with her Ins. Co[employer] And get 1 or more names of Hepatologist that are in her network, and we will be glad to do a referral to them

## 2019-01-15 ENCOUNTER — Other Ambulatory Visit: Payer: Self-pay

## 2019-01-15 DIAGNOSIS — K754 Autoimmune hepatitis: Secondary | ICD-10-CM

## 2019-01-16 ENCOUNTER — Other Ambulatory Visit: Payer: Self-pay

## 2019-01-16 ENCOUNTER — Ambulatory Visit: Payer: Medicare Other | Admitting: Gastroenterology

## 2019-01-16 ENCOUNTER — Encounter: Payer: Self-pay | Admitting: Gastroenterology

## 2019-01-16 VITALS — BP 122/74 | HR 95 | Temp 98.3°F | Ht 66.0 in | Wt 208.2 lb

## 2019-01-16 DIAGNOSIS — R05 Cough: Secondary | ICD-10-CM | POA: Diagnosis not present

## 2019-01-16 DIAGNOSIS — K449 Diaphragmatic hernia without obstruction or gangrene: Secondary | ICD-10-CM | POA: Diagnosis not present

## 2019-01-16 DIAGNOSIS — K219 Gastro-esophageal reflux disease without esophagitis: Secondary | ICD-10-CM

## 2019-01-16 DIAGNOSIS — K754 Autoimmune hepatitis: Secondary | ICD-10-CM

## 2019-01-16 DIAGNOSIS — R490 Dysphonia: Secondary | ICD-10-CM

## 2019-01-16 DIAGNOSIS — R053 Chronic cough: Secondary | ICD-10-CM

## 2019-01-16 NOTE — Progress Notes (Signed)
P  Chief Complaint:   GERD, chronic cough  Referring Physician: Athalia Cellar, MD  GI History: 63 yo female referred to me by Dr. Havery Moros for evaluation of possible antireflux intervention with Transoral Incisionless Fundoplication (TIF) with a goal to stop or significantly reduce acid suppression therapy.  She has a longstanding history of reflux symptoms for >20 years, having treated with multiple antacids and acid suppression agents over the years  Evaluated by Pulmonary Clinic for chronic cough in 12/2015 diagnosed as 2/2 chronic reflux with perhaps some seasonal allergies contributing to reactive airway disease. Prescribed Singulair and Symbicort along with increased acid suppression and referral back to GI at that time.   GERD history: -Index symptoms: Belching, sour brash, regurgitation, chronic nonproductive cough.  No dysphagia or odynophagia -Current medications: Protonix 20 mg bid -Complications: Hiatal hernia, chronic cough -Exacerbated by pork, spicy foods, tomato based sauces -Interventions: Avoid eating within 3 hours of bedtime, +HOB elevation, caffeine elimination  GERD-HRQL Questionnaire: 21/50 (on high-dose therapy)  GERD evaluation: - EGD (02/15/2018, Dr. Havery Moros): 1 cm hiatal hernia with otherwise normal esophagus, normal stomach and duodenum.  - Barium Esophagram (07/2015): 13 mm barium tablet passes easily. Full column gastroesophageal reflux observed.   HPI:    Patient is a 63 y.o. female presenting to the Gastroenterology Clinic for follow-up. States the cough has worsened, particularly post dinner and nocturnal. Sleeps with HOB elevated (incline bed). No improvement with Benadryl.   Still with intermittent breakthrough regurgitation and sour brash despite ongoing high-dose PPI therapy. + intermittent belching. Takes Gas-X qhs with some improvement in belch. +Raspy voice. Ongoing nonproductive cough, which is the most bothersome symptom. No  dysphagia.  Weight stable.  No lower GI symptoms.  Review of systems:     No chest pain, no SOB, no fevers, no urinary sx   Past Medical History:  Diagnosis Date  . Allergy   . ANXIETY 10/18/2007   Qualifier: Diagnosis of  By: Sarajane Jews MD, Ishmael Holter   . Autoimmune hepatitis ([place])   . Cataract    "beginnings of"  . Chronic low back pain   . Diabetes ([place]) 02/24/2016  . GERD 10/18/2007   Qualifier: Diagnosis of  By: Sherlynn Stalls, CMA, [place]    . Hyperlipemia 03/12/2014  . HYPERTENSION 10/18/2007   Qualifier: Diagnosis of  By: Sherlynn Stalls, CMA, [place]    . Insomnia 03/12/2014  . Irritable bowel syndrome 10/18/2007   Qualifier: Diagnosis of  By: Sarajane Jews MD, Ishmael Holter   . Osteopenia   . Recurrent knee pain    R knee hx meniscal tear 2002 with repair    Patient's surgical history, family medical history, social history, medications and allergies were all reviewed in Epic    Current Outpatient Medications  Medication Sig Dispense Refill  . atorvastatin (LIPITOR) 10 MG tablet Take 1 tablet (10 mg total) by mouth daily. 90 tablet 3  . cholecalciferol (VITAMIN D) 25 MCG (1000 UT) tablet Take 2 tablets (2,000 Units total) by mouth 2 (two) times daily.    . citalopram (CELEXA) 20 MG tablet Take 1 tablet (20 mg total) by mouth daily. 90 tablet 3  . diphenhydrAMINE HCl (BENADRYL ALLERGY PO) Take 1 tablet by mouth as needed (usuallu spring and fall).    . diphenhydrAMINE-APAP, sleep, (TYLENOL PM EXTRA STRENGTH PO) Take by mouth daily.     . Homeopathic Products (ZICAM ALLERGY RELIEF NA) Place into the nose as needed.    Marland Kitchen losartan (COZAAR) 50 MG tablet Take 1 tablet (  50 mg total) by mouth daily. 90 tablet 2  . MELATONIN PO Take by mouth daily.     Marland Kitchen omeprazole (PRILOSEC) 20 MG capsule Take 1 capsule (20 mg total) by mouth 2 (two) times daily before a meal. 180 capsule 3  . traZODone (DESYREL) 50 MG tablet TAKE 0.5-1 TABLETS (25-50 MG TOTAL) BY MOUTH AT BEDTIME AS NEEDED FOR SLEEP. (Patient not taking: Reported on  01/16/2019) 90 tablet 2   No current facility-administered medications for this visit.     Physical Exam:     BP 122/74   Pulse 95   Temp 98.3 F (36.8 C)   Ht 5\' 6"  (1.676 m)   Wt 208 lb 4 oz (94.5 kg)   BMI 33.61 kg/m   GENERAL:  Pleasant female in NAD PSYCH: : Cooperative, normal affect EENT:  conjunctiva pink, mucous membranes moist, neck supple without masses CARDIAC:  RRR, no murmur heard, no peripheral edema PULM: Normal respiratory effort, lungs CTA bilaterally, no wheezing ABDOMEN:  Nondistended, soft, nontender. 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  Has objective evidence of reflux esophagram in 2017.  Was seen in the Pulmonary Clinic in 12/2015.  Discussed plan as outlined below:  -Referral back to Pulmonary Clinic for evaluation of chronic cough and assistance with maximization of medical therapy -Referral to ENT Clinic for evaluation of chronic cough and LPR and laryngoscopy as appropriate -Resume Prilosec as currently prescribed -Resume antireflux lifestyle/dietary modifications as already employing -Pending ENT and Pulmonary evaluation, if c/w chronic cough 2/2 LPR, discussed proceeding with TIF.  If there is an alternate second primary process, will attempt to maximize medical management of cough so to minimize suture disruption if we do proceed with TIF -Patient is very hopeful for TIF as a means to control reflux, improve cough, and limit/stop need for acid suppression therapy -Hernia repair at time of TIF as above -Discussed the risks, benefits, alternatives of Transoral Incisionless Fundoplication (TIF) at length today.  Reviewed dietary/activity  postoperative restrictions.  5) Autoimmune Hepatitis: -Was recently referred to the Hepatology Clinic for evaluation and treatment.  Appointment pending       I spent a total of 25 minutes of face-to-face time with the patient. Greater than 50% of the time was spent counseling and coordinating care.     Lavena Bullion ,DO, FACG 01/16/2019, 2:57 PM

## 2019-01-16 NOTE — Patient Instructions (Addendum)
You are being referred to ENT and Pulmonary to evaluate your chronic cough.You will be notified by there office with a date and time for your appointment.  It was a pleasure to see you today!  Return office visit in 3 months  [place], D.O.

## 2019-01-22 DIAGNOSIS — R05 Cough: Secondary | ICD-10-CM | POA: Diagnosis not present

## 2019-01-23 ENCOUNTER — Encounter: Payer: Self-pay | Admitting: Internal Medicine

## 2019-01-23 ENCOUNTER — Other Ambulatory Visit: Payer: Self-pay

## 2019-01-23 ENCOUNTER — Ambulatory Visit (INDEPENDENT_AMBULATORY_CARE_PROVIDER_SITE_OTHER): Payer: Medicare Other

## 2019-01-23 ENCOUNTER — Ambulatory Visit: Payer: Medicare Other | Admitting: Internal Medicine

## 2019-01-23 DIAGNOSIS — R05 Cough: Secondary | ICD-10-CM

## 2019-01-23 DIAGNOSIS — R058 Other specified cough: Secondary | ICD-10-CM | POA: Insufficient documentation

## 2019-01-23 HISTORY — DX: Other specified cough: R05.8

## 2019-01-23 MED ORDER — BENZONATATE 200 MG PO CAPS: 200.0000 mg | ORAL_CAPSULE | Freq: Four times a day (QID) | ORAL | 1 refills | Status: DC | PRN

## 2019-01-23 NOTE — Patient Instructions (Signed)
Tessalon 200 mg every 6 hours as needed   For drainage / throat tickle try take CHLORPHENIRAMINE  4 mg  (Chlortab 4mg   at [employer] should be easiest to find in the green box)  take one every 4 hours as needed - available over the counter- may cause drowsiness so start with just a bedtime dose or two and see how you tolerate it before trying in daytime    GERD (REFLUX)  is an extremely common cause of respiratory symptoms just like yours , many times with no obvious heartburn at all.    It can be treated with medication, but also with lifestyle changes including elevation of the head of your bed (ideally with 6 -8inch blocks under the headboard of your bed),  Smoking cessation, avoidance of late meals, excessive alcohol, and avoid fatty foods, chocolate, peppermint, colas, red wine, and acidic juices such as orange juice.  NO MINT OR MENTHOL PRODUCTS SO NO COUGH DROPS  USE SUGARLESS CANDY INSTEAD (Jolley ranchers or Stover's or Life Savers) or even ice chips will also do - the key is to swallow to prevent all throat clearing. NO OIL BASED VITAMINS - use powdered substitutes.  Avoid fish oil when coughing.   Please remember to go to the  x-ray department  for your tests - we will call you with the results when they are available    I would strongly recommend weight loss and surgery for the reflux

## 2019-01-23 NOTE — Progress Notes (Signed)
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 > allergy eval x 3 neg testing and moved to [place] 2014 and lived here ever  Since with  same allergy eval (says neg) rec only relief has been with Zicam gel apply to nose then onset cough x 2017 daily  eval by [facility]:  PFT 12/17/15:FVC 2.21 L (63%) FEV1 1.75 L (65%) FEV1/FVC 0.79 FEF 25-75 1.49 L (63%) negative bronchodilator response 07/30/15: FVC 2.54 L (72%) FEV1 1.87 L (69%) FEV1/FVC 0.74 FEF 25-75 1.43 L (60%) negative bronchodilator response TLC 5.46 L (102%) RV 138% ERV 52% DLCO uncorrected 50%  IMAGING BARIUM SWALLOW 07/25/15   Full column gastroesophageal reflux. Asymmetric elevation right hemidiaphragm.  MAXILLOFACIAL CT W/O 07/25/15   No significant paranasal sinus disease.     Marland Kitchen  LABS 07/21/15 IgG: 1230 IgA: 70 IgM: 214            History of Present Illness  01/23/2019  Pulmonary/ 1st office eval/[facility]  Chief Complaint  Patient presents with  . Pulmonary Consult    Referred by Dr Ivette Loyal. Pt c/o cough x 2 years. Cough is esp worse at night and sometimes to the point of vomiting.   Dyspnea:  Really just with coughing  Cough: worse with meals, not much nasal drainge/ coughs to gag/vomit frequently  Even on gerd rx  Sleep: on side 30 degrees eventually resolves and able to sleep fine then wakes up nl hours with lots of nasal drainage  SABA use: none -no better when she did  No obvious other patterns  day to day or daytime variability or assoc excess/ purulent sputum or mucus plugs or hemoptysis or cp or chest tightness, subjective wheeze or overt sinus or hb symptoms.   Sleeping as above without nocturnal  or early am exacerbation  of respiratory  c/o's or need for noct saba. Also denies any obvious fluctuation of symptoms with weather or  environmental changes or other aggravating or alleviating factors except as outlined above   No unusual exposure hx or h/o childhood pna/ asthma or knowledge of premature birth.  Current Allergies, Complete Past Medical History, Past Surgical History, Family History, and Social History were reviewed in [employer] record.  ROS  The following are not active complaints unless bolded Hoarseness, sore throat, dysphagia, dental problems, itching, sneezing,  nasal congestion or discharge of excess mucus or purulent secretions, ear ache,   fever, chills, sweats, unintended wt loss or wt gain, classically pleuritic or exertional cp,  orthopnea pnd or arm/hand swelling  or leg swelling, presyncope, palpitations, abdominal pain, anorexia, nausea, vomiting, diarrhea  or change in bowel habits or change in bladder habits, change in stools or change in urine, dysuria, hematuria,  rash, arthralgias, visual complaints, headache, numbness, weakness or ataxia or problems with walking or coordination,  change in mood= anxious  or  memory.           Past Medical History:  Diagnosis Date  . Allergy   . ANXIETY 10/18/2007   Qualifier: Diagnosis of  By: Sarajane Jews MD, Ishmael Holter   . Autoimmune hepatitis ([place])   . Cataract    "beginnings of"  . Chronic low back pain   . Diabetes ([place]) 02/24/2016  . GERD 10/18/2007   Qualifier: Diagnosis of  BySherlynn Stalls, CMA, [place]    . Hyperlipemia 03/12/2014  . HYPERTENSION 10/18/2007   Qualifier: Diagnosis of  By: Sherlynn Stalls, CMA, [place]    . Insomnia 03/12/2014  . Irritable bowel syndrome 10/18/2007   Qualifier: Diagnosis of  By: Sarajane Jews MD, Ishmael Holter   . Osteopenia   . Recurrent knee pain    R knee hx meniscal tear 2002 with repair    Outpatient Medications Prior to Visit  Medication Sig Dispense Refill  . atorvastatin (LIPITOR) 10 MG tablet Take 1 tablet (10 mg total) by mouth daily. 90 tablet 3  . Calcium Citrate-Vitamin D (CALCIUM CITRATE + PO) Take 1 tablet by  mouth 2 (two) times daily.    . citalopram (CELEXA) 20 MG tablet Take 1 tablet (20 mg total) by mouth daily. 90 tablet 3  . diphenhydrAMINE HCl (BENADRYL ALLERGY PO) Take 1 tablet by mouth as needed (usuallu spring and fall).    . diphenhydrAMINE-APAP, sleep, (TYLENOL PM EXTRA STRENGTH PO) Take by mouth daily.     . Homeopathic Products (ZICAM ALLERGY RELIEF NA) Place into the nose as needed.    . hydrochlorothiazide (MICROZIDE) 12.5 MG capsule Take 12.5 mg by mouth daily.    Marland Kitchen MELATONIN PO Take by mouth daily.     Marland Kitchen omeprazole (PRILOSEC) 20 MG capsule Take 1 capsule (20 mg total) by mouth 2 (two) times daily before a meal. 180 capsule 3  . Vitamin D, Cholecalciferol, 10 MCG (400 UNIT) CAPS Take 1 capsule by mouth daily.    . cholecalciferol (VITAMIN D) 25 MCG (1000 UT) tablet Take 2 tablets (2,000 Units total) by mouth 2 (two) times daily.    Marland Kitchen losartan (COZAAR) 50 MG tablet Take 1 tablet (50 mg total) by mouth daily. 90 tablet 2  . traZODone (DESYREL) 50 MG tablet TAKE 0.5-1 TABLETS (25-50 MG TOTAL) BY MOUTH AT BEDTIME AS NEEDED FOR SLEEP. 90 tablet 2      Objective:     BP 132/90 (BP Location: Left Arm, Cuff Size: Normal)   Pulse (!) 114   Temp (!) 97.4 F (36.3 C) (Temporal)   Ht 5\' 6"  (1.676 m)   Wt 205 lb 12.8 oz (93.4 kg)   SpO2 95% Comment: on RA  BMI 33.22 kg/m   SpO2: 95 %(on RA)   amb pleasant obese (by BMI) wf nad    HEENT : pt wearing mask not removed for exam due to covid -19 concerns.    NECK :  without JVD/Nodes/TM/ nl carotid upstrokes bilaterally   LUNGS: no acc muscle use,  Nl contour chest which is clear to A and P bilaterally without cough on insp or exp maneuvers   CV:  RRR  no s3 or murmur or increase in P2, and no edema   ABD:  soft and nontender with nl inspiratory excursion in the supine position. No bruits or organomegaly appreciated, bowel sounds nl  MS:  Nl gait/ ext warm without deformities, calf tenderness, cyanosis or clubbing No obvious  joint restrictions   SKIN: warm and dry without lesions    NEURO:  alert, approp, nl sensorium with  no motor or cerebellar deficits apparent.      CXR PA and Lateral:   01/23/2019 :    I personally reviewed images and agree with radiology impression as follows:    No active cardiopulmonary disease. Curvilinear right middle lobe Scarring.  My review Previous elevation R HD markedly improved      Assessment   Upper airway  cough syndrome Onset 2017  BARIUM SWALLOW 07/25/15   Full column gastroesophageal reflux. Asymmetric elevation right hemidiaphragm. -  rec 01/23/2019  1st gen H1 blockers per guidelines  And tessilon 200    Of the three most common causes of  Sub-acute / recurrent or chronic cough, only one (GERD)  can actually contribute to/ trigger  the other two (asthma and post nasal drip syndrome)  and perpetuate the cylce of cough.  While not intuitively obvious, many patients with chronic low grade reflux do not cough until there is a primary insult that disturbs the protective epithelial barrier and exposes sensitive nerve endings.   This is typically viral but can due to [place] probably applies here      >>>The point is that once this occurs, it is difficult to eliminate the cycle  using anything but a maximally effective acid suppression regimen at least in the short run, accompanied by an appropriate diet to address non acid GERD and control pnds with 1st gen H1 blockers per guidelines  (which she has never been able to accomplish by other means despite 3 allergy evals) and eliminate the cough itself to the extent possible with tessilon.  Coughing to point of vomiting is clearly a sign of poorly controlled reflux whether primary or secondary and given the DgEs findings it's very hard not to argue based on her hx she needs to proceed with NIF sooner rather than later and then f/u here prn if not better to her satisfaction.     Total time devoted to counseling   > 50 % of initial 60 min office visit:  review case with pt/ discussion of options/alternatives/ personally creating written customized instructions  in presence of pt  then going over those specific  Instructions directly with the pt including how to use all of the meds but in particular covering each new medication in detail and the difference between the maintenance= "automatic" meds and the prns using an action plan format for the latter (If this problem/symptom => do that organization reading Left to right).  Please see AVS from this visit for a full list of these instructions which I personally wrote for this pt and  are unique to this visit.           Christinia Gully, MD 01/23/2019

## 2019-01-24 ENCOUNTER — Encounter: Payer: Self-pay | Admitting: Internal Medicine

## 2019-01-24 NOTE — Telephone Encounter (Signed)
Dr Melvyn Novas, this message was received for you this afternoon.  I have a question about DG CHEST 2 VIEW resulted on 01/23/19, 8:54 PM.  Should I be concerned about the scarring? What is the cause? [facility] routed to Dr Melvyn Novas

## 2019-01-24 NOTE — Progress Notes (Signed)
Left msg on VM ok per [facility]

## 2019-01-24 NOTE — Assessment & Plan Note (Signed)
Onset 2017  BARIUM SWALLOW 07/25/15   Full column gastroesophageal reflux. Asymmetric elevation right hemidiaphragm. -  rec 01/23/2019  1st gen H1 blockers per guidelines  And tessilon 200    Of the three most common causes of  Sub-acute / recurrent or chronic cough, only one (GERD)  can actually contribute to/ trigger  the other two (asthma and post nasal drip syndrome)  and perpetuate the cylce of cough.  While not intuitively obvious, many patients with chronic low grade reflux do not cough until there is a primary insult that disturbs the protective epithelial barrier and exposes sensitive nerve endings.   This is typically viral but can due to [place] probably applies here     >>>The point is that once this occurs, it is difficult to eliminate the cycle  using anything but a maximally effective acid suppression regimen at least in the short run, accompanied by an appropriate diet to address non acid GERD and control pnds with 1st gen H1 blockers per guidelines  (which she has never been able to accomplish by other means despite 3 allergy evals) and eliminate the cough itself to the extent possible with tessilon.  Coughing to point of vomiting is clearly a sign of poorly controlled reflux whether primary or secondary and given the DgEs findings it's very hard not to argue based on her hx she needs to proceed with NIF sooner rather than later and then f/u here prn if not better to her satisfaction.   Total time devoted to counseling  > 50 % of initial 60 min office visit:  review case with pt/ discussion of options/alternatives/ personally creating written customized instructions  in presence of pt  then going over those specific  Instructions directly with the pt including how to use all of the meds but in particular covering each new medication in detail and the difference between the maintenance= "automatic" meds and the prns using an action plan format for the latter (If this  problem/symptom => do that organization reading Left to right).  Please see AVS from this visit for a full list of these instructions which I personally wrote for this pt and  are unique to this visit.

## 2019-01-25 ENCOUNTER — Other Ambulatory Visit: Payer: Self-pay | Admitting: *Deleted

## 2019-01-25 ENCOUNTER — Telehealth: Payer: Self-pay | Admitting: *Deleted

## 2019-01-25 MED ORDER — BENZONATATE 200 MG PO CAPS: 200.0000 mg | ORAL_CAPSULE | Freq: Four times a day (QID) | ORAL | 1 refills | Status: DC | PRN

## 2019-01-25 NOTE — Telephone Encounter (Signed)
-----   Message from Tanda Rockers, MD sent at 01/24/2019  5:08 AM EST ----- Be sure she understands not to use benadryl or other antihistamines once starts chlorpheniramine

## 2019-01-25 NOTE — Telephone Encounter (Signed)
LMTCB

## 2019-01-29 NOTE — Telephone Encounter (Signed)
LMTCB

## 2019-02-06 NOTE — Telephone Encounter (Signed)
LMTCB

## 2019-02-12 ENCOUNTER — Telehealth: Payer: Self-pay

## 2019-02-12 NOTE — Telephone Encounter (Signed)
Left message for patient to call back to the office to schedule TIF procedure;

## 2019-02-13 NOTE — Telephone Encounter (Signed)
Please see MyChart messages for information

## 2019-02-28 ENCOUNTER — Other Ambulatory Visit: Payer: Self-pay | Admitting: Gastroenterology

## 2019-03-07 ENCOUNTER — Other Ambulatory Visit: Payer: Self-pay | Admitting: Gastroenterology

## 2019-03-07 DIAGNOSIS — R053 Chronic cough: Secondary | ICD-10-CM

## 2019-03-07 DIAGNOSIS — R05 Cough: Secondary | ICD-10-CM

## 2019-03-19 DIAGNOSIS — R748 Abnormal levels of other serum enzymes: Secondary | ICD-10-CM | POA: Diagnosis not present

## 2019-03-19 DIAGNOSIS — K754 Autoimmune hepatitis: Secondary | ICD-10-CM | POA: Diagnosis not present

## 2019-03-20 ENCOUNTER — Other Ambulatory Visit: Payer: Self-pay | Admitting: Nurse Practitioner

## 2019-03-20 ENCOUNTER — Telehealth: Payer: Self-pay

## 2019-03-20 DIAGNOSIS — R748 Abnormal levels of other serum enzymes: Secondary | ICD-10-CM

## 2019-03-20 DIAGNOSIS — K754 Autoimmune hepatitis: Secondary | ICD-10-CM

## 2019-03-20 NOTE — Telephone Encounter (Signed)
Left several messages on patients voice mail to contact the office regarding her TIF procedure on 03/28/2019. [facility] has been notified to cancel this procedure until March due to rising Covid 19 cases. A voice mail has been left with patient  that her procedure has been cancelled until further notice.

## 2019-03-23 ENCOUNTER — Other Ambulatory Visit (HOSPITAL_COMMUNITY): Payer: Medicare Other

## 2019-03-28 ENCOUNTER — Ambulatory Visit (HOSPITAL_COMMUNITY): Admit: 2019-03-28 | Payer: Medicare Other | Admitting: Gastroenterology

## 2019-03-28 ENCOUNTER — Encounter (HOSPITAL_COMMUNITY): Payer: Self-pay

## 2019-03-28 SURGERY — ESOPHAGOGASTRODUODENOSCOPY (EGD) WITH PROPOFOL
Anesthesia: General

## 2019-03-29 ENCOUNTER — Ambulatory Visit
Admission: RE | Admit: 2019-03-29 | Discharge: 2019-03-29 | Disposition: A | Discharge status: Home / Self Care | Payer: Medicare Other | Source: Ambulatory Visit | Attending: Nurse Practitioner | Admitting: Nurse Practitioner

## 2019-03-29 DIAGNOSIS — K754 Autoimmune hepatitis: Secondary | ICD-10-CM

## 2019-03-29 DIAGNOSIS — K7689 Other specified diseases of liver: Secondary | ICD-10-CM | POA: Diagnosis not present

## 2019-03-29 DIAGNOSIS — R748 Abnormal levels of other serum enzymes: Secondary | ICD-10-CM

## 2019-04-03 DIAGNOSIS — K754 Autoimmune hepatitis: Secondary | ICD-10-CM | POA: Diagnosis not present

## 2019-05-23 ENCOUNTER — Ambulatory Visit: Payer: Medicare Other | Admitting: Family Medicine

## 2019-05-24 ENCOUNTER — Other Ambulatory Visit: Payer: Self-pay | Admitting: Family Medicine

## 2019-05-29 DIAGNOSIS — H5213 Myopia, bilateral: Secondary | ICD-10-CM | POA: Diagnosis not present

## 2019-05-29 DIAGNOSIS — H524 Presbyopia: Secondary | ICD-10-CM | POA: Diagnosis not present

## 2019-05-29 DIAGNOSIS — H43813 Vitreous degeneration, bilateral: Secondary | ICD-10-CM | POA: Diagnosis not present

## 2019-05-29 DIAGNOSIS — H52203 Unspecified astigmatism, bilateral: Secondary | ICD-10-CM | POA: Diagnosis not present

## 2019-06-04 ENCOUNTER — Other Ambulatory Visit: Payer: Self-pay | Admitting: Family Medicine

## 2019-06-04 NOTE — Telephone Encounter (Signed)
Medication: atorvastatin (LIPITOR) 10 MG tablet    Has the patient contacted their pharmacy? Yes.   (If no, request that the patient contact the pharmacy for the refill.) (If yes, when and what did the pharmacy advise?)  Preferred Pharmacy (with phone number or street name): [id number] - [place], [address]  [address], [place] [address]  Phone:  [phone] Fax:  [phone]  DEA #:  [id number]  Agent: Please be advised that RX refills may take up to 3 business days. We ask that you follow-up with your pharmacy.

## 2019-06-07 ENCOUNTER — Other Ambulatory Visit: Payer: Self-pay | Admitting: Gastroenterology

## 2019-06-07 DIAGNOSIS — R053 Chronic cough: Secondary | ICD-10-CM

## 2019-06-07 DIAGNOSIS — K219 Gastro-esophageal reflux disease without esophagitis: Secondary | ICD-10-CM

## 2019-06-07 DIAGNOSIS — R05 Cough: Secondary | ICD-10-CM

## 2019-06-15 DIAGNOSIS — M79671 Pain in right foot: Secondary | ICD-10-CM | POA: Diagnosis not present

## 2019-06-15 DIAGNOSIS — M25571 Pain in right ankle and joints of right foot: Secondary | ICD-10-CM | POA: Diagnosis not present

## 2019-06-15 DIAGNOSIS — G8929 Other chronic pain: Secondary | ICD-10-CM | POA: Diagnosis not present

## 2019-06-16 ENCOUNTER — Observation Stay (HOSPITAL_BASED_OUTPATIENT_CLINIC_OR_DEPARTMENT_OTHER)
Admission: EM | Admit: 2019-06-16 | Discharge: 2019-06-17 | Disposition: A | Discharge status: Home / Self Care | Payer: Medicare Other | Attending: Internal Medicine | Admitting: Internal Medicine

## 2019-06-16 ENCOUNTER — Encounter (HOSPITAL_BASED_OUTPATIENT_CLINIC_OR_DEPARTMENT_OTHER): Payer: Self-pay

## 2019-06-16 ENCOUNTER — Emergency Department (HOSPITAL_BASED_OUTPATIENT_CLINIC_OR_DEPARTMENT_OTHER): Payer: Medicare Other

## 2019-06-16 ENCOUNTER — Other Ambulatory Visit: Payer: Self-pay

## 2019-06-16 DIAGNOSIS — R079 Chest pain, unspecified: Secondary | ICD-10-CM | POA: Diagnosis not present

## 2019-06-16 DIAGNOSIS — E1159 Type 2 diabetes mellitus with other circulatory complications: Secondary | ICD-10-CM | POA: Diagnosis not present

## 2019-06-16 DIAGNOSIS — R0602 Shortness of breath: Principal | ICD-10-CM | POA: Insufficient documentation

## 2019-06-16 DIAGNOSIS — F329 Major depressive disorder, single episode, unspecified: Secondary | ICD-10-CM | POA: Diagnosis not present

## 2019-06-16 DIAGNOSIS — Z9842 Cataract extraction status, left eye: Secondary | ICD-10-CM | POA: Insufficient documentation

## 2019-06-16 DIAGNOSIS — Z803 Family history of malignant neoplasm of breast: Secondary | ICD-10-CM | POA: Insufficient documentation

## 2019-06-16 DIAGNOSIS — Z20822 Contact with and (suspected) exposure to covid-19: Secondary | ICD-10-CM | POA: Diagnosis not present

## 2019-06-16 DIAGNOSIS — K449 Diaphragmatic hernia without obstruction or gangrene: Secondary | ICD-10-CM | POA: Insufficient documentation

## 2019-06-16 DIAGNOSIS — Z888 Allergy status to other drugs, medicaments and biological substances status: Secondary | ICD-10-CM | POA: Insufficient documentation

## 2019-06-16 DIAGNOSIS — I110 Hypertensive heart disease with heart failure: Secondary | ICD-10-CM | POA: Insufficient documentation

## 2019-06-16 DIAGNOSIS — E785 Hyperlipidemia, unspecified: Secondary | ICD-10-CM | POA: Diagnosis not present

## 2019-06-16 DIAGNOSIS — N179 Acute kidney failure, unspecified: Secondary | ICD-10-CM | POA: Diagnosis not present

## 2019-06-16 DIAGNOSIS — G4700 Insomnia, unspecified: Secondary | ICD-10-CM | POA: Diagnosis not present

## 2019-06-16 DIAGNOSIS — Z7984 Long term (current) use of oral hypoglycemic drugs: Secondary | ICD-10-CM | POA: Insufficient documentation

## 2019-06-16 DIAGNOSIS — Z8349 Family history of other endocrine, nutritional and metabolic diseases: Secondary | ICD-10-CM | POA: Insufficient documentation

## 2019-06-16 DIAGNOSIS — K219 Gastro-esophageal reflux disease without esophagitis: Secondary | ICD-10-CM | POA: Diagnosis present

## 2019-06-16 DIAGNOSIS — R0600 Dyspnea, unspecified: Secondary | ICD-10-CM

## 2019-06-16 DIAGNOSIS — Z833 Family history of diabetes mellitus: Secondary | ICD-10-CM | POA: Insufficient documentation

## 2019-06-16 DIAGNOSIS — F32A Depression, unspecified: Secondary | ICD-10-CM

## 2019-06-16 DIAGNOSIS — Z6833 Body mass index (BMI) 33.0-33.9, adult: Secondary | ICD-10-CM | POA: Diagnosis not present

## 2019-06-16 DIAGNOSIS — Z9841 Cataract extraction status, right eye: Secondary | ICD-10-CM | POA: Insufficient documentation

## 2019-06-16 DIAGNOSIS — M8580 Other specified disorders of bone density and structure, unspecified site: Secondary | ICD-10-CM | POA: Insufficient documentation

## 2019-06-16 DIAGNOSIS — Z791 Long term (current) use of non-steroidal anti-inflammatories (NSAID): Secondary | ICD-10-CM | POA: Insufficient documentation

## 2019-06-16 DIAGNOSIS — Z800 Family history of malignant neoplasm of digestive organs: Secondary | ICD-10-CM | POA: Diagnosis not present

## 2019-06-16 DIAGNOSIS — Z9049 Acquired absence of other specified parts of digestive tract: Secondary | ICD-10-CM | POA: Insufficient documentation

## 2019-06-16 DIAGNOSIS — R0609 Other forms of dyspnea: Secondary | ICD-10-CM

## 2019-06-16 DIAGNOSIS — Z885 Allergy status to narcotic agent status: Secondary | ICD-10-CM | POA: Diagnosis not present

## 2019-06-16 DIAGNOSIS — E669 Obesity, unspecified: Secondary | ICD-10-CM

## 2019-06-16 DIAGNOSIS — Z808 Family history of malignant neoplasm of other organs or systems: Secondary | ICD-10-CM | POA: Insufficient documentation

## 2019-06-16 DIAGNOSIS — Z8249 Family history of ischemic heart disease and other diseases of the circulatory system: Secondary | ICD-10-CM | POA: Insufficient documentation

## 2019-06-16 DIAGNOSIS — J984 Other disorders of lung: Secondary | ICD-10-CM | POA: Diagnosis not present

## 2019-06-16 DIAGNOSIS — I10 Essential (primary) hypertension: Secondary | ICD-10-CM | POA: Diagnosis present

## 2019-06-16 DIAGNOSIS — F419 Anxiety disorder, unspecified: Secondary | ICD-10-CM | POA: Diagnosis not present

## 2019-06-16 DIAGNOSIS — I5032 Chronic diastolic (congestive) heart failure: Secondary | ICD-10-CM | POA: Diagnosis present

## 2019-06-16 DIAGNOSIS — E119 Type 2 diabetes mellitus without complications: Secondary | ICD-10-CM

## 2019-06-16 DIAGNOSIS — Z79899 Other long term (current) drug therapy: Secondary | ICD-10-CM | POA: Insufficient documentation

## 2019-06-16 HISTORY — DX: Obesity, unspecified: E66.9

## 2019-06-16 HISTORY — DX: Other forms of dyspnea: R06.09

## 2019-06-16 HISTORY — DX: Depression, unspecified: F32.A

## 2019-06-16 LAB — URINALYSIS, ROUTINE W REFLEX MICROSCOPIC
Bilirubin Urine: NEGATIVE
Glucose, UA: >=500 mg/dL — AB
Hgb urine dipstick: NEGATIVE
Ketones, ur: NEGATIVE mg/dL
Leukocytes,Ua: NEGATIVE
Nitrite: NEGATIVE
Protein, ur: NEGATIVE mg/dL
Specific Gravity, Urine: 1.01 (ref 1.005–1.030)
pH: 6 (ref 5.0–8.0)

## 2019-06-16 LAB — COMPREHENSIVE METABOLIC PANEL
ALT: 38 U/L (ref 0–44)
AST: 39 U/L (ref 15–41)
Albumin: 4.1 g/dL (ref 3.5–5.0)
Alkaline Phosphatase: 204 U/L — ABNORMAL HIGH (ref 38–126)
Anion gap: 12 (ref 5–15)
BUN: 21 mg/dL (ref 8–23)
CO2: 19 mmol/L — ABNORMAL LOW (ref 22–32)
Calcium: 9.7 mg/dL (ref 8.9–10.3)
Chloride: 101 mmol/L (ref 98–111)
Creatinine, Ser: 1.3 mg/dL — ABNORMAL HIGH (ref 0.44–1.00)
GFR calc Af Amer: 50 mL/min — ABNORMAL LOW (ref >60)
GFR calc non Af Amer: 43 mL/min — ABNORMAL LOW (ref >60)
Glucose, Bld: 344 mg/dL — ABNORMAL HIGH (ref 70–99)
Potassium: 3.6 mmol/L (ref 3.5–5.1)
Sodium: 132 mmol/L — ABNORMAL LOW (ref 135–145)
Total Bilirubin: 0.6 mg/dL (ref 0.3–1.2)
Total Protein: 7.8 g/dL (ref 6.5–8.1)

## 2019-06-16 LAB — URINALYSIS, MICROSCOPIC (REFLEX)

## 2019-06-16 LAB — CBC WITH DIFFERENTIAL/PLATELET
Abs Immature Granulocytes: 0.09 10*3/uL — ABNORMAL HIGH (ref 0.00–0.07)
Basophils Absolute: 0 10*3/uL (ref 0.0–0.1)
Basophils Relative: 0 %
Eosinophils Absolute: 0 10*3/uL (ref 0.0–0.5)
Eosinophils Relative: 0 %
HCT: 44.5 % (ref 36.0–46.0)
Hemoglobin: 14.3 g/dL (ref 12.0–15.0)
Immature Granulocytes: 1 %
Lymphocytes Relative: 16 %
Lymphs Abs: 1.5 10*3/uL (ref 0.7–4.0)
MCH: 27.3 pg (ref 26.0–34.0)
MCHC: 32.1 g/dL (ref 30.0–36.0)
MCV: 84.9 fL (ref 80.0–100.0)
Monocytes Absolute: 0.4 10*3/uL (ref 0.1–1.0)
Monocytes Relative: 4 %
Neutro Abs: 7.3 10*3/uL (ref 1.7–7.7)
Neutrophils Relative %: 79 %
Platelets: 268 10*3/uL (ref 150–400)
RBC: 5.24 MIL/uL — ABNORMAL HIGH (ref 3.87–5.11)
RDW: 13.4 % (ref 11.5–15.5)
WBC: 9.3 10*3/uL (ref 4.0–10.5)
nRBC: 0 % (ref 0.0–0.2)

## 2019-06-16 LAB — CBC
HCT: 40.1 % (ref 36.0–46.0)
Hemoglobin: 13.1 g/dL (ref 12.0–15.0)
MCH: 27.2 pg (ref 26.0–34.0)
MCHC: 32.7 g/dL (ref 30.0–36.0)
MCV: 83.4 fL (ref 80.0–100.0)
Platelets: 259 10*3/uL (ref 150–400)
RBC: 4.81 MIL/uL (ref 3.87–5.11)
RDW: 13.5 % (ref 11.5–15.5)
WBC: 9.9 10*3/uL (ref 4.0–10.5)
nRBC: 0 % (ref 0.0–0.2)

## 2019-06-16 LAB — TSH: TSH: 0.471 u[IU]/mL (ref 0.350–4.500)

## 2019-06-16 LAB — GLUCOSE, CAPILLARY
Glucose-Capillary: 112 mg/dL — ABNORMAL HIGH (ref 70–99)
Glucose-Capillary: 245 mg/dL — ABNORMAL HIGH (ref 70–99)

## 2019-06-16 LAB — CBG MONITORING, ED
Glucose-Capillary: 122 mg/dL — ABNORMAL HIGH (ref 70–99)
Glucose-Capillary: 125 mg/dL — ABNORMAL HIGH (ref 70–99)

## 2019-06-16 LAB — TROPONIN I (HIGH SENSITIVITY)
Troponin I (High Sensitivity): 4 ng/L (ref <18)
Troponin I (High Sensitivity): 4 ng/L (ref <18)

## 2019-06-16 LAB — HEMOGLOBIN A1C
Hgb A1c MFr Bld: 6.9 % — ABNORMAL HIGH (ref 4.8–5.6)
Mean Plasma Glucose: 151.33 mg/dL

## 2019-06-16 LAB — CREATININE, SERUM
Creatinine, Ser: 1.06 mg/dL — ABNORMAL HIGH (ref 0.44–1.00)
GFR calc Af Amer: >60 mL/min (ref >60)
GFR calc non Af Amer: 55 mL/min — ABNORMAL LOW (ref >60)

## 2019-06-16 LAB — MAGNESIUM: Magnesium: 1.8 mg/dL (ref 1.7–2.4)

## 2019-06-16 LAB — SARS CORONAVIRUS 2 (TAT 6-24 HRS): SARS Coronavirus 2: NEGATIVE

## 2019-06-16 LAB — D-DIMER, QUANTITATIVE: D-Dimer, Quant: 0.93 ug/mL-FEU — ABNORMAL HIGH (ref 0.00–0.50)

## 2019-06-16 LAB — PHOSPHORUS: Phosphorus: 3.4 mg/dL (ref 2.5–4.6)

## 2019-06-16 LAB — HIV ANTIBODY (ROUTINE TESTING W REFLEX): HIV Screen 4th Generation wRfx: NONREACTIVE

## 2019-06-16 MED ORDER — ACETAMINOPHEN 650 MG RE SUPP: 650.0000 mg | Freq: Four times a day (QID) | RECTAL | Status: DC | PRN

## 2019-06-16 MED ORDER — LABETALOL HCL 5 MG/ML IV SOLN: 10.0000 mg | Freq: Four times a day (QID) | INTRAVENOUS | Status: DC | PRN

## 2019-06-16 MED ORDER — CITALOPRAM HYDROBROMIDE 20 MG PO TABS
20.0000 mg | ORAL_TABLET | Freq: Every day | ORAL | Status: DC
  Administered 2019-06-16 – 2019-06-17 (×2): 20 mg via ORAL
  Filled 2019-06-16 (×2): qty 1

## 2019-06-16 MED ORDER — CLONIDINE HCL 0.1 MG PO TABS
0.1000 mg | ORAL_TABLET | Freq: Once | ORAL | Status: AC
  Administered 2019-06-16: 13:00:00 0.1 mg via ORAL
  Filled 2019-06-16: qty 1

## 2019-06-16 MED ORDER — ACETAMINOPHEN 325 MG PO TABS: 650.0000 mg | ORAL_TABLET | Freq: Four times a day (QID) | ORAL | Status: DC | PRN

## 2019-06-16 MED ORDER — ATORVASTATIN CALCIUM 10 MG PO TABS
10.0000 mg | ORAL_TABLET | Freq: Every day | ORAL | Status: DC
  Administered 2019-06-16 – 2019-06-17 (×2): 10 mg via ORAL
  Filled 2019-06-16 (×2): qty 1

## 2019-06-16 MED ORDER — ONDANSETRON HCL 4 MG/2ML IJ SOLN: 4.0000 mg | Freq: Four times a day (QID) | INTRAMUSCULAR | Status: DC | PRN

## 2019-06-16 MED ORDER — ENOXAPARIN SODIUM 40 MG/0.4ML ~~LOC~~ SOLN
40.0000 mg | SUBCUTANEOUS | Status: DC
  Administered 2019-06-16: 21:00:00 40 mg via SUBCUTANEOUS
  Filled 2019-06-16: qty 0.4

## 2019-06-16 MED ORDER — PANTOPRAZOLE SODIUM 40 MG PO TBEC
40.0000 mg | DELAYED_RELEASE_TABLET | Freq: Every day | ORAL | Status: DC
  Administered 2019-06-16: 21:00:00 40 mg via ORAL
  Filled 2019-06-16: qty 1

## 2019-06-16 MED ORDER — SODIUM CHLORIDE 0.9 % IV BOLUS
500.0000 mL | Freq: Once | INTRAVENOUS | Status: AC
  Administered 2019-06-16: 500 mL via INTRAVENOUS

## 2019-06-16 MED ORDER — ONDANSETRON HCL 4 MG PO TABS: 4.0000 mg | ORAL_TABLET | Freq: Four times a day (QID) | ORAL | Status: DC | PRN

## 2019-06-16 MED ORDER — IOHEXOL 350 MG/ML SOLN
100.0000 mL | Freq: Once | INTRAVENOUS | Status: AC | PRN
  Administered 2019-06-16: 12:00:00 93 mL via INTRAVENOUS

## 2019-06-16 NOTE — H&P (Addendum)
History and Physical    Karen Hall [id number] DOB: 04/18/1955 DOA: 06/16/2019  PCP: Shelda Pal, DO  Patient coming from: [place] have personally briefly reviewed patient's old medical records in [place]  Chief Complaint: Dyspnea with exertion  HPI: Karen Hall is a 64 y.o. female with medical history significant of hypertension, hyperlipidemia, GERD, depression, prediabetes, obesity presents to emergency department due to worsening shortness of breath with exertion since 1 to 2 weeks.  Patient tells me that she has shortness of breath especially with exertion (with taking few steps) denies association with leg swelling, orthopnea, PND, wheezing, history of COPD, obstructive sleep apnea, thyroid issues, anxiety/panic attacks, cough, congestion, fever, chills, runny nose, COVID-19 exposure, decreased appetite, weight loss, hematemesis, melena, generalized weakness or lethargy. She has severe GERD so she could not lie flat on the bed at baseline.  Reports chest pain on Thursday night which resolved on its own.  She denies chest pain today.  She lives alone, no history of smoking, alcohol, illicit drug use.  ED Course: Upon arrival to [place] ED patient's blood pressure noted to be elevated, mild AKI, sodium of 132, afebrile with no leukocytosis, UA negative, troponin x2 -, D-dimer: Mildly elevated.  CT angiogram of chest obtained which came back negative for pulmonary embolism.  She received 1 dose of clonidine 0.1 mg in the ED.  Due to persistent shortness of breath patient transferred to [facility] for further evaluation and management.  Review of Systems: As per HPI otherwise negative.    Past Medical History:  Diagnosis Date  . Allergy   . ANXIETY 10/18/2007   Qualifier: Diagnosis of  By: Sarajane Jews MD, Ishmael Holter   . Autoimmune hepatitis ([place])   . Cataract    "beginnings of"  . Chronic low back pain   . Diabetes ([place])  02/24/2016  . GERD 10/18/2007   Qualifier: Diagnosis of  By: Sherlynn Stalls, CMA, [place]    . Hyperlipemia 03/12/2014  . HYPERTENSION 10/18/2007   Qualifier: Diagnosis of  By: Sherlynn Stalls, CMA, [place]    . Insomnia 03/12/2014  . Irritable bowel syndrome 10/18/2007   Qualifier: Diagnosis of  By: Sarajane Jews MD, Ishmael Holter   . Osteopenia   . Recurrent knee pain    R knee hx meniscal tear 2002 with repair    Past Surgical History:  Procedure Laterality Date  . BREAST EXCISIONAL BIOPSY Left    Fibroid removed, 24 years ago   . BREAST SURGERY     fibroid  . CATARACT EXTRACTION Bilateral 09/2018  . CHOLECYSTECTOMY    . COLONOSCOPY     Around 2010  . FOOT SURGERY Right   . LIVER BIOPSY  02/2018  . MENISCUS REPAIR Right   . TONSILLECTOMY       reports that she has never smoked. She has never used smokeless tobacco. She reports previous alcohol use. She reports that she does not use drugs.  Allergies  Allergen Reactions  . Aspartame And Phenylalanine Other (See Comments)    Headache  . Codeine Nausea Only    Family History  Problem Relation Age of Onset  . Cancer Mother        melanoma  . Hyperlipidemia Mother   . Breast cancer Mother   . Liver cancer Father   . Cancer Maternal Grandfather        unknown type  . Heart disease Paternal Grandfather   . Diabetes Maternal Aunt   .  Diabetes Sister   . Lung disease Neg Hx   . Colon cancer Neg Hx   . Esophageal cancer Neg Hx   . Rectal cancer Neg Hx   . Stomach cancer Neg Hx     Prior to Admission medications   Medication Sig Start Date End Date Taking? Authorizing Provider  losartan (COZAAR) 25 MG tablet Take 25 mg by mouth 2 (two) times daily.   Yes [provider]  atorvastatin (LIPITOR) 10 MG tablet TAKE 1 TABLET BY MOUTH EVERY DAY 06/04/19   Wendling, Crosby Oyster, DO  benzonatate (TESSALON) 200 MG capsule Take 1 capsule (200 mg total) by mouth every 6 (six) hours as needed for cough. 01/25/19   Tanda Rockers, MD  Calcium  Citrate-Vitamin D (CALCIUM CITRATE + PO) Take 1 tablet by mouth 2 (two) times daily.    [provider]  citalopram (CELEXA) 20 MG tablet TAKE 1 TABLET BY MOUTH EVERY DAY 05/24/19   Wendling, Crosby Oyster, DO  diphenhydrAMINE-APAP, sleep, (TYLENOL PM EXTRA STRENGTH PO) Take by mouth daily.     [provider]  Homeopathic Products [facility] ALLERGY RELIEF NA) Place into the nose as needed.    [provider]  hydrochlorothiazide (MICROZIDE) 12.5 MG capsule Take 12.5 mg by mouth daily.    [provider]  MELATONIN PO Take by mouth daily.     [provider]  omeprazole (PRILOSEC) 20 MG capsule TAKE 1 CAPSULE (20 MG TOTAL) BY MOUTH 2 (TWO) TIMES DAILY BEFORE A MEAL. 02/28/19   Armbruster, [name], MD  Vitamin D, Cholecalciferol, 10 MCG (400 UNIT) CAPS Take 1 capsule by mouth daily.    [provider]    Physical Exam: Vitals:   06/16/19 1400 06/16/19 1430 06/16/19 1500 06/16/19 1530  BP: (!) 145/109 (!) 144/110 (!) 139/108 (!) 145/106  Pulse: 71 83 76 75  Resp: (!) 21 20 20 18   Temp:      TempSrc:      SpO2: 96% 98% 97% 97%  Weight:      Height:        Constitutional: NAD, calm, comfortable, communicating well, obese Eyes: PERRL, lids and conjunctivae normal ENMT: Mucous membranes are moist. Posterior pharynx clear of any exudate or lesions.Normal dentition.  Neck: normal, supple, no masses, no thyromegaly Respiratory: clear to auscultation bilaterally, no wheezing, no crackles. Normal respiratory effort. No accessory muscle use.  Cardiovascular: Regular rate and rhythm, no murmurs / rubs / gallops. No extremity edema. 2+ pedal pulses. No carotid bruits.  Abdomen: no tenderness, no masses palpated. No hepatosplenomegaly. Bowel sounds positive.  Musculoskeletal: no clubbing / cyanosis. No joint deformity upper and lower extremities. Good ROM, no contractures. Normal muscle tone.  Skin: no rashes, lesions, ulcers. No  induration Neurologic: CN 2-12 grossly intact. Sensation intact, DTR normal. Strength 5/5 in all 4.  Psychiatric: Normal judgment and insight. Alert and oriented x 3. Normal mood.    Labs on Admission: I have personally reviewed following labs and imaging studies  CBC: Recent Labs  Lab 06/16/19 0935  WBC 9.3  NEUTROABS 7.3  HGB 14.3  HCT 44.5  MCV 84.9  PLT [id number]   Basic Metabolic Panel: Recent Labs  Lab 06/16/19 0935  NA 132*  K 3.6  CL 101  CO2 19*  GLUCOSE 344*  BUN 21  CREATININE 1.30*  CALCIUM 9.7   GFR: Estimated Creatinine Clearance: 49.2 mL/min (A) (by C-G formula based on SCr of 1.3 mg/dL (H)). Liver Function Tests: Recent  Labs  Lab 06/16/19 0935  AST 39  ALT 38  ALKPHOS 204*  BILITOT 0.6  PROT 7.8  ALBUMIN 4.1   No results for input(s): LIPASE, AMYLASE in the last 168 hours. No results for input(s): AMMONIA in the last 168 hours. Coagulation Profile: No results for input(s): INR, PROTIME in the last 168 hours. Cardiac Enzymes: No results for input(s): CKTOTAL, CKMB, CKMBINDEX, TROPONINI in the last 168 hours. BNP (last 3 results) No results for input(s): PROBNP in the last 8760 hours. HbA1C: No results for input(s): HGBA1C in the last 72 hours. CBG: Recent Labs  Lab 06/16/19 1442 06/16/19 1443 06/16/19 1655  GLUCAP 125* 122* 112*   Lipid Profile: No results for input(s): CHOL, HDL, LDLCALC, TRIG, CHOLHDL, LDLDIRECT in the last 72 hours. Thyroid Function Tests: No results for input(s): TSH, T4TOTAL, FREET4, T3FREE, THYROIDAB in the last 72 hours. Anemia Panel: No results for input(s): VITAMINB12, FOLATE, FERRITIN, TIBC, IRON, RETICCTPCT in the last 72 hours. Urine analysis:    Component Value Date/Time   COLORURINE YELLOW 06/16/2019 1057   APPEARANCEUR CLEAR 06/16/2019 1057   LABSPEC 1.010 06/16/2019 1057   PHURINE 6.0 06/16/2019 1057   GLUCOSEU >=500 (A) 06/16/2019 1057   HGBUR NEGATIVE 06/16/2019 1057   BILIRUBINUR NEGATIVE  06/16/2019 1057   KETONESUR NEGATIVE 06/16/2019 1057   PROTEINUR NEGATIVE 06/16/2019 1057   NITRITE NEGATIVE 06/16/2019 1057   LEUKOCYTESUR NEGATIVE 06/16/2019 1057    Radiological Exams on Admission: CT Angio Chest PE W and/or Wo Contrast  Result Date: 06/16/2019 CLINICAL DATA:  Shortness of breath on exertion.  Elevated D-dimer. EXAM: CT ANGIOGRAPHY CHEST WITH CONTRAST TECHNIQUE: Multidetector CT imaging of the chest was performed using the standard protocol during bolus administration of intravenous contrast. Multiplanar CT image reconstructions and MIPs were obtained to evaluate the vascular anatomy. CONTRAST:  81mL OMNIPAQUE IOHEXOL 350 MG/ML SOLN COMPARISON:  02/20/2015 FINDINGS: Cardiovascular: There is satisfactory opacification of the pulmonary arteries to the segmental level. There is no evidence of a pulmonary embolism. Heart is normal in size and configuration. No coronary artery calcifications. No pericardial effusion. Great vessels are normal in caliber. No aortic dissection or significant atherosclerosis. Mediastinum/Nodes: No neck base, mediastinal or hilar masses or enlarged lymph nodes. Trachea and esophagus are unremarkable. Lungs/Pleura: Bandlike atelectasis in the right lower lobe, improved compared to the prior CTA. Minor atelectasis in the left lower lobe and left upper lobe lingula adjacent to the heart. Linear atelectasis or scarring in the right middle lobe. Remainder of the lungs is clear. No pleural effusion or pneumothorax. Upper Abdomen: No acute abnormality. Musculoskeletal: No fracture or acute finding. No osteoblastic or osteolytic lesions. Review of the MIP images confirms the above findings. IMPRESSION: 1. No acute findings.  No evidence of a pulmonary embolism. 2. Mild areas of lung scarring/atelectasis. No evidence of pneumonia or pulmonary edema. Electronically Signed   By: Lajean Manes M.D.   On: 06/16/2019 11:55   DG Chest Portable 1 View  Result Date:  06/16/2019 CLINICAL DATA:  Shortness of breath, chest pain. EXAM: PORTABLE CHEST 1 VIEW COMPARISON:  Chest x-ray dated 07/04/2015. FINDINGS: Heart size and mediastinal contours are within normal limits. Lungs are clear. No pleural effusion or pneumothorax is seen. Osseous structures about the chest are unremarkable. Stable chronic elevation of the RIGHT hemidiaphragm. IMPRESSION: No active disease. No evidence of pneumonia or pulmonary edema. Electronically Signed   By: Franki Cabot M.D.   On: 06/16/2019 10:20    EKG: Independently reviewed.  Sinus  rhythm.  Prolonged PR interval.  Prolonged QT interval.  No ST elevation or depression noted.  Assessment/Plan Principal Problem:   Shortness of breath Active Problems:   Essential hypertension   GERD   Hyperlipemia   Prediabetes   Obesity   Depression   AKI (acute kidney injury) ([place])   Shortness of breath: -Unknown etiology?  Could be due to undiagnosed obstructive sleep apnea versus thyroid issues. -Patient's H&H is stable-no history of melena.  Denies history of current smoking or COPD or panic attacks.  COVID-19 pending -Troponin x2 -, EKG: No acute changes.  D-dimer slightly elevated.  CT angiogram obtained which came back negative for pulmonary embolism.  Chest x-ray is negative for pneumonia.  Patient is afebrile with no leukocytosis. -Admit patient under observation. -We will check TSH and get transthoracic echo  -May need outpatient sleep study to rule out obstructive sleep apnea  AKI: Creatinine 1.30, GFR: 43 (baseline creatinine 0.90) -Hold nephrotoxic medication. -Repeat BMP tomorrow AM.  Prediabetes: Check A1c -Blood glucose: 344 upon arrival. -CBG every 4 hours.  Recent CBG: 112. -Continue to monitor.  Hypertension: Blood pressure is elevated -On HCTZ and losartan at home.  Hold for now due to AKI. -Start on labetalol as needed and monitor blood pressure closely.  Resume home meds once kidney function is back to  baseline.  Hyperlipidemia: Continue statin  GERD: Continue omeprazole  Depression: Continue Celexa  Obesity: BMI of 32.60   DVT prophylaxis: Lovenox/SCD/TED Code Status: Full code Family Communication: None present at bedside.  Plan of care discussed with patient in length and she verbalized understanding and agreed with it. Disposition Plan: Likely home tomorrow Consults called: None Admission status: Observation   Mckinley Jewel MD [facility] Pager [phone]  If 7PM-7AM, please contact night-coverage [email or web address] Password [facility]  06/16/2019, 5:34 PM

## 2019-06-16 NOTE — Progress Notes (Signed)
Patient arrives to [facility], c/a/ox4, denying complaints. Pt placed on telemetry.

## 2019-06-16 NOTE — ED Provider Notes (Signed)
[facility] EMERGENCY DEPARTMENT Provider Note   CSN: [id number] Arrival date & time: 06/16/19  [id number]     History Chief Complaint  Patient presents with  . Chest Pain    Karen Hall is a 64 y.o. female.  HPI      Karen Hall is a 64 y.o. female, with a history of anxiety, prediabetes, HTN, hyperlipidemia, obesity, presenting to the ED with shortness of breath for the last week or two, typically with exertion. 2 days ago, while at rest, she did have an incidence of chest pain to the left chest, sharp and stabbing, intermittent over the course of 1-2 minutes.  This resolved and has not recurred. She also endorses some lightheadedness with ambulation as well as fatigue over the past couple weeks.  Over the last several days, she has been measuring her blood pressure and pulse rate and has noted that despite her medications, she has had increased blood pressure and pulses around 100. States she was changed from HCTZ to losartan, prescribed 25 mg bid, but has been taking once a day due to feeling tired and sleepy with it.  Over the last couple days has had higher than normal BP so has been taking it twice daily.  Denies fever/chills, abdominal pain, extremity numbness/weakness, syncope, urinary symptoms, back pain, N/V/D, lower extremity pain/edema, orthopnea, or any other complaints.    Past Medical History:  Diagnosis Date  . Allergy   . ANXIETY 10/18/2007   Qualifier: Diagnosis of  By: Sarajane Jews MD, Ishmael Holter   . Autoimmune hepatitis ([place])   . Cataract    "beginnings of"  . Chronic low back pain   . Diabetes ([place]) 02/24/2016  . GERD 10/18/2007   Qualifier: Diagnosis of  By: Sherlynn Stalls, CMA, [place]    . Hyperlipemia 03/12/2014  . HYPERTENSION 10/18/2007   Qualifier: Diagnosis of  By: Sherlynn Stalls, CMA, [place]    . Insomnia 03/12/2014  . Irritable bowel syndrome 10/18/2007   Qualifier: Diagnosis of  By: Sarajane Jews MD, Ishmael Holter   . Osteopenia   . Recurrent knee pain    R knee hx  meniscal tear 2002 with repair    Patient Active Problem List   Diagnosis Date Noted  . Shortness of breath 06/16/2019  . Upper airway cough syndrome 01/23/2019  . Osteopenia 01/13/2018  . Hyperlipidemia associated with type 2 diabetes mellitus ([place] [place]) 12/19/2017  . Diabetes mellitus ([place]) 02/24/2016  . Nevus 02/24/2016  . Chronic bronchitis ([place]) 07/21/2015  . Hyperlipemia 03/12/2014  . Insomnia 03/12/2014  . Anxiety state 10/18/2007  . Hypertension associated with diabetes ([place]) 10/18/2007  . Allergic rhinitis 10/18/2007  . GERD 10/18/2007  . IRRITABLE BOWEL SYNDROME 10/18/2007  . HEADACHE 10/18/2007    Past Surgical History:  Procedure Laterality Date  . BREAST EXCISIONAL BIOPSY Left    Fibroid removed, 24 years ago   . BREAST SURGERY     fibroid  . CATARACT EXTRACTION Bilateral 09/2018  . CHOLECYSTECTOMY    . COLONOSCOPY     Around 2010  . FOOT SURGERY Right   . LIVER BIOPSY  02/2018  . MENISCUS REPAIR Right   . TONSILLECTOMY       OB History   No obstetric history on file.     Family History  Problem Relation Age of Onset  . Cancer Mother        melanoma  . Hyperlipidemia Mother   . Breast cancer Mother   . Liver cancer Father   . Cancer  Maternal Grandfather        unknown type  . Heart disease Paternal Grandfather   . Diabetes Maternal Aunt   . Diabetes Sister   . Lung disease Neg Hx   . Colon cancer Neg Hx   . Esophageal cancer Neg Hx   . Rectal cancer Neg Hx   . Stomach cancer Neg Hx     Social History   Tobacco Use  . Smoking status: Never Smoker  . Smokeless tobacco: Never Used  Substance Use Topics  . Alcohol use: Not Currently    Alcohol/week: 0.0 standard drinks    Comment: 1 drink monthly  . Drug use: No    Home Medications Prior to Admission medications   Medication Sig Start Date End Date Taking? Authorizing Provider  losartan (COZAAR) 25 MG tablet Take 25 mg by mouth 2 (two) times daily.   Yes [provider]    atorvastatin (LIPITOR) 10 MG tablet TAKE 1 TABLET BY MOUTH EVERY DAY 06/04/19   Wendling, Crosby Oyster, DO  benzonatate (TESSALON) 200 MG capsule Take 1 capsule (200 mg total) by mouth every 6 (six) hours as needed for cough. 01/25/19   Tanda Rockers, MD  Calcium Citrate-Vitamin D (CALCIUM CITRATE + PO) Take 1 tablet by mouth 2 (two) times daily.    [provider]  citalopram (CELEXA) 20 MG tablet TAKE 1 TABLET BY MOUTH EVERY DAY 05/24/19   Wendling, Crosby Oyster, DO  diphenhydrAMINE-APAP, sleep, (TYLENOL PM EXTRA STRENGTH PO) Take by mouth daily.     [provider]  Homeopathic Products [facility] ALLERGY RELIEF NA) Place into the nose as needed.    [provider]  hydrochlorothiazide (MICROZIDE) 12.5 MG capsule Take 12.5 mg by mouth daily.    [provider]  MELATONIN PO Take by mouth daily.     [provider]  omeprazole (PRILOSEC) 20 MG capsule TAKE 1 CAPSULE (20 MG TOTAL) BY MOUTH 2 (TWO) TIMES DAILY BEFORE A MEAL. 02/28/19   Armbruster, [name], MD  Vitamin D, Cholecalciferol, 10 MCG (400 UNIT) CAPS Take 1 capsule by mouth daily.    [provider]    Allergies    Aspartame and phenylalanine and Codeine  Review of Systems   Review of Systems  Constitutional: Positive for fatigue. Negative for chills, diaphoresis and fever.  Respiratory: Positive for shortness of breath. Negative for cough.   Cardiovascular: Positive for chest pain (resolved and has not recurred).  Gastrointestinal: Negative for abdominal pain, diarrhea, nausea and vomiting.  Genitourinary: Negative for difficulty urinating, dysuria, frequency and hematuria.  Musculoskeletal: Negative for back pain and neck pain.  Neurological: Positive for light-headedness. Negative for syncope and weakness.  All other systems reviewed and are negative.   Physical Exam Updated Vital Signs BP (!) 189/113 (BP Location: Right Arm)   Pulse (!) 103   Temp 98.6 F (37 C)  (Oral)   Resp 17   Ht 5\' 6"  (1.676 m)   Wt 91.6 kg   SpO2 97%   BMI 32.60 kg/m   Physical Exam Vitals and nursing note reviewed.  Constitutional:      General: She is not in acute distress.    Appearance: She is well-developed. She is obese. She is not diaphoretic.  HENT:     Head: Normocephalic and atraumatic.     Mouth/Throat:     Mouth: Mucous membranes are moist.     Pharynx: Oropharynx is clear.  Eyes:     Conjunctiva/sclera: Conjunctivae normal.  Cardiovascular:     Rate and Rhythm: Normal rate and regular rhythm.     Pulses: Normal pulses.          Radial pulses are 2+ on the right side and 2+ on the left side.       Posterior tibial pulses are 2+ on the right side and 2+ on the left side.     Heart sounds: Normal heart sounds.     Comments: Tactile temperature in the extremities appropriate and equal bilaterally. Pulmonary:     Effort: Pulmonary effort is normal. No respiratory distress.     Breath sounds: Normal breath sounds.     Comments: No increased work of breathing at rest, however, patient visibly dyspneic on exertion with simply walking at a slow pace. Abdominal:     Palpations: Abdomen is soft.     Tenderness: There is no abdominal tenderness. There is no guarding.  Musculoskeletal:     Cervical back: Neck supple.     Right lower leg: No edema.     Left lower leg: No edema.  Lymphadenopathy:     Cervical: No cervical adenopathy.  Skin:    General: Skin is warm and dry.  Neurological:     Mental Status: She is alert and oriented to person, place, and time.     Comments: No noted acute cognitive deficit. Sensation grossly intact to light touch in the extremities.   Grip strengths equal bilaterally.   Strength 5/5 in all extremities.  No gait disturbance.  Coordination intact.  Cranial nerves III-XII grossly intact.  Handles oral secretions without noted difficulty.  No noted phonation or speech deficit. No facial droop.   Psychiatric:        Mood  and Affect: Mood and affect normal.        Speech: Speech normal.        Behavior: Behavior normal.     ED Results / Procedures / Treatments   Labs (all labs ordered are listed, but only abnormal results are displayed) Labs Reviewed  COMPREHENSIVE METABOLIC PANEL - Abnormal; Notable for the following components:      Result Value   Sodium 132 (*)    CO2 19 (*)    Glucose, Bld 344 (*)    Creatinine, Ser 1.30 (*)    Alkaline Phosphatase 204 (*)    GFR calc non Af Amer 43 (*)    GFR calc Af Amer 50 (*)    All other components within normal limits  CBC WITH DIFFERENTIAL/PLATELET - Abnormal; Notable for the following components:   RBC 5.24 (*)    Abs Immature Granulocytes 0.09 (*)    All other components within normal limits  URINALYSIS, ROUTINE W REFLEX MICROSCOPIC - Abnormal; Notable for the following components:   Glucose, UA >=500 (*)    All other components within normal limits  D-DIMER, QUANTITATIVE (NOT AT [facility]) - Abnormal; Notable for the following components:   D-Dimer, Quant 0.93 (*)    All other components within normal limits  URINALYSIS, MICROSCOPIC (REFLEX) - Abnormal; Notable for the following components:   Bacteria, UA RARE (*)    All other components within normal limits  CBG MONITORING, ED - Abnormal; Notable for the following components:   Glucose-Capillary 125 (*)    All other components within normal limits  CBG MONITORING, ED - Abnormal; Notable for the following components:   Glucose-Capillary 122 (*)    All other components within normal limits  SARS CORONAVIRUS 2 (TAT 6-24 HRS)  HEMOGLOBIN A1C  TROPONIN I (HIGH SENSITIVITY)  TROPONIN I (HIGH SENSITIVITY)    EKG EKG Interpretation  Date/Time:  Saturday June 16 2019 09:26:10 EDT Ventricular Rate:  101 PR Interval:    QRS Duration: 98 QT Interval:  361 QTC Calculation: 468 R Axis:   -20 Text Interpretation: Sinus tachycardia Prolonged PR interval Borderline left axis deviation Low voltage,  precordial leads since last tracing no significant change Confirmed by Malvin Johns [phone]) on 06/16/2019 9:43:26 AM    EKG Interpretation  Date/Time:  Saturday June 16 2019 10:08:27 EDT Ventricular Rate:  79 PR Interval:    QRS Duration: 96 QT Interval:  380 QTC Calculation: 433 R Axis:   69 Text Interpretation: Sinus rhythm Prolonged PR interval Borderline T abnormalities, anterior leads since last tracing no significant change Confirmed by Malvin Johns [phone]) on 06/16/2019 10:21:44 AM       Radiology CT Angio Chest PE W and/or Wo Contrast  Result Date: 06/16/2019 CLINICAL DATA:  Shortness of breath on exertion.  Elevated D-dimer. EXAM: CT ANGIOGRAPHY CHEST WITH CONTRAST TECHNIQUE: Multidetector CT imaging of the chest was performed using the standard protocol during bolus administration of intravenous contrast. Multiplanar CT image reconstructions and MIPs were obtained to evaluate the vascular anatomy. CONTRAST:  75mL OMNIPAQUE IOHEXOL 350 MG/ML SOLN COMPARISON:  02/20/2015 FINDINGS: Cardiovascular: There is satisfactory opacification of the pulmonary arteries to the segmental level. There is no evidence of a pulmonary embolism. Heart is normal in size and configuration. No coronary artery calcifications. No pericardial effusion. Great vessels are normal in caliber. No aortic dissection or significant atherosclerosis. Mediastinum/Nodes: No neck base, mediastinal or hilar masses or enlarged lymph nodes. Trachea and esophagus are unremarkable. Lungs/Pleura: Bandlike atelectasis in the right lower lobe, improved compared to the prior CTA. Minor atelectasis in the left lower lobe and left upper lobe lingula adjacent to the heart. Linear atelectasis or scarring in the right middle lobe. Remainder of the lungs is clear. No pleural effusion or pneumothorax. Upper Abdomen: No acute abnormality. Musculoskeletal: No fracture or acute finding. No osteoblastic or osteolytic lesions. Review of the MIP  images confirms the above findings. IMPRESSION: 1. No acute findings.  No evidence of a pulmonary embolism. 2. Mild areas of lung scarring/atelectasis. No evidence of pneumonia or pulmonary edema. Electronically Signed   By: Lajean Manes M.D.   On: 06/16/2019 11:55   DG Chest Portable 1 View  Result Date: 06/16/2019 CLINICAL DATA:  Shortness of breath, chest pain. EXAM: PORTABLE CHEST 1 VIEW COMPARISON:  Chest x-ray dated 07/04/2015. FINDINGS: Heart size and mediastinal contours are within normal limits. Lungs are clear. No pleural effusion or pneumothorax is seen. Osseous structures about the chest are unremarkable. Stable chronic elevation of the RIGHT hemidiaphragm. IMPRESSION: No active disease. No evidence of pneumonia or pulmonary edema. Electronically Signed   By: Franki Cabot M.D.   On: 06/16/2019 10:20    Procedures Procedures (including critical care time)  Medications Ordered in ED Medications  sodium chloride 0.9 % bolus 500 mL (0 mLs Intravenous Stopped 06/16/19 1251)  iohexol (OMNIPAQUE) 350 MG/ML injection 100 mL (93 mLs Intravenous Contrast Given 06/16/19 1138)  cloNIDine (CATAPRES) tablet 0.1 mg (0.1 mg Oral Given 06/16/19 1251)    ED Course  I have reviewed the triage vital signs and the nursing notes.  Pertinent labs & imaging results that were available during my care of the patient were reviewed by me and considered in my medical decision making (see chart for details).  Clinical  Course as of Jun 15 1521  Sat Jun 16, 2019  1432 Spoke with Dr. Doristine Bosworth, hospitalist at [facility]. Agrees to admit the patient.    [SJ]    Clinical Course User Index [SJ] Trenisha Lafavor C, PA-C   MDM Rules/Calculators/A&P                      Patient presents complaining of shortness of breath with exertion over the past week or 2. She does have elevated blood pressure here in the ED initially.  A small increase in her creatinine.  She continued to have shortness of breath with exertion even  after blood pressure returned to a more acceptable level. Delta troponins negative.   Initially mildly tachycardic.  D-dimer mildly elevated, but CT PE study of the chest was unremarkable.  I personally reviewed and interpreted her labs and imaging studies.  Concern is for shortness of breath without known cause.  Hypertensive urgency is a consideration.  Due to the patient's continued dyspnea on exertion, we will plan to admit the patient for further management.  Findings and plan of care discussed with Malvin Johns, MD. Dr. Tamera Punt personally evaluated and examined this patient.     Karen Hall was evaluated in Emergency Department on 06/16/2019 for the symptoms described in the history of present illness. She was evaluated in the context of the global COVID-19 pandemic, which necessitated consideration that the patient might be at risk for infection with the SARS-CoV-2 virus that causes COVID-19. Institutional protocols and algorithms that pertain to the evaluation of patients at risk for COVID-19 are in a state of rapid change based on information released by regulatory bodies including the CDC and federal and state organizations. These policies and algorithms were followed during the patient's care in the ED.  Vitals:   06/16/19 0930 06/16/19 1100 06/16/19 1251 06/16/19 1300  BP:  (!) 181/117 (!) 161/105 (!) 158/73  Pulse:  88  76  Resp:  (!) 21  (!) 24  Temp:      TempSrc:      SpO2:  99%  96%  Weight: 91.6 kg     Height: 5\' 6"  (1.676 m)        Final Clinical Impression(s) / ED Diagnoses Final diagnoses:  Dyspnea on exertion    Rx / DC Orders ED Discharge Orders    None       Layla Maw 06/16/19 1523    Malvin Johns, MD 06/17/19 [id number]

## 2019-06-16 NOTE — ED Notes (Signed)
ED Provider at bedside. 

## 2019-06-16 NOTE — ED Triage Notes (Signed)
Pt arrives to ED with reports of CP since Thursday night states that she is exhausted, has been SOB with ambulation, and feeling that her HR and BP have both been elevated.

## 2019-06-17 ENCOUNTER — Observation Stay (HOSPITAL_BASED_OUTPATIENT_CLINIC_OR_DEPARTMENT_OTHER): Payer: Medicare Other

## 2019-06-17 DIAGNOSIS — N179 Acute kidney failure, unspecified: Secondary | ICD-10-CM | POA: Diagnosis not present

## 2019-06-17 DIAGNOSIS — I10 Essential (primary) hypertension: Secondary | ICD-10-CM

## 2019-06-17 DIAGNOSIS — I5032 Chronic diastolic (congestive) heart failure: Secondary | ICD-10-CM | POA: Diagnosis present

## 2019-06-17 DIAGNOSIS — R0602 Shortness of breath: Secondary | ICD-10-CM | POA: Diagnosis not present

## 2019-06-17 LAB — BASIC METABOLIC PANEL
Anion gap: 10 (ref 5–15)
BUN: 16 mg/dL (ref 8–23)
CO2: 24 mmol/L (ref 22–32)
Calcium: 9.6 mg/dL (ref 8.9–10.3)
Chloride: 106 mmol/L (ref 98–111)
Creatinine, Ser: 1.18 mg/dL — ABNORMAL HIGH (ref 0.44–1.00)
GFR calc Af Amer: 56 mL/min — ABNORMAL LOW (ref >60)
GFR calc non Af Amer: 48 mL/min — ABNORMAL LOW (ref >60)
Glucose, Bld: 119 mg/dL — ABNORMAL HIGH (ref 70–99)
Potassium: 3.8 mmol/L (ref 3.5–5.1)
Sodium: 140 mmol/L (ref 135–145)

## 2019-06-17 LAB — CBC
HCT: 40.1 % (ref 36.0–46.0)
Hemoglobin: 12.6 g/dL (ref 12.0–15.0)
MCH: 27.2 pg (ref 26.0–34.0)
MCHC: 31.4 g/dL (ref 30.0–36.0)
MCV: 86.4 fL (ref 80.0–100.0)
Platelets: 257 10*3/uL (ref 150–400)
RBC: 4.64 MIL/uL (ref 3.87–5.11)
RDW: 13.8 % (ref 11.5–15.5)
WBC: 10 10*3/uL (ref 4.0–10.5)
nRBC: 0 % (ref 0.0–0.2)

## 2019-06-17 LAB — GLUCOSE, CAPILLARY
Glucose-Capillary: 115 mg/dL — ABNORMAL HIGH (ref 70–99)
Glucose-Capillary: 79 mg/dL (ref 70–99)

## 2019-06-17 LAB — ECHOCARDIOGRAM COMPLETE
Height: 66 in
Weight: 3284.8 oz

## 2019-06-17 MED ORDER — ISOSORBIDE MONONITRATE ER 30 MG PO TB24: 30.0000 mg | ORAL_TABLET | Freq: Every day | ORAL | 11 refills | Status: DC

## 2019-06-17 MED ORDER — ALUM & MAG HYDROXIDE-SIMETH 200-200-20 MG/5ML PO SUSP
30.0000 mL | ORAL | Status: DC | PRN
  Administered 2019-06-17: 11:00:00 30 mL via ORAL
  Filled 2019-06-17: qty 30

## 2019-06-17 MED ORDER — METFORMIN HCL ER 500 MG PO TB24: 500.0000 mg | ORAL_TABLET | Freq: Every day | ORAL | 1 refills | Status: DC

## 2019-06-17 NOTE — Discharge Summary (Signed)
Discharge Summary  Karen Hall [id number] DOB: 02-Sep-1955  PCP: Shelda Pal, DO  Admit date: 06/16/2019 Discharge date: 06/17/2019  Time spent: 35 minutes  Recommendations for Outpatient Follow-up:  1. New medication: Metformin XR 500 p.o. daily 2. New medication: Imdur 30 p.o. daily 3. Medication change: Suggested that patient who currently takes PPI 3-4 times a day before meals and at bedtime changed to PPI 2 pills twice a day 4. Patient will follow up with her PCP later this week.  At that time, her meds can be readdressed to see if she should continue current blood pressure and diabetic regimen that has been started here.  Repeat check of her renal function should be done at that time.  Discharge Diagnoses:  Active Hospital Problems   Diagnosis Date Noted  . Shortness of breath 06/16/2019  . Chronic diastolic CHF (congestive heart failure) ([place]) 06/17/2019  .  Diabetes mellitus 06/16/2019  . Obesity 06/16/2019  . Depression 06/16/2019  . AKI (acute kidney injury) ([place]) 06/16/2019  . Hyperlipemia 03/12/2014  . GERD 10/18/2007  . Essential hypertension 10/18/2007    Resolved Hospital Problems  No resolved problems to display.    Discharge Condition: Improved, being discharged home  Diet recommendation: Heart healthy, carb modified  Vitals:   06/17/19 0420 06/17/19 0753  BP: (!) 157/88 (!) 136/108  Pulse: 75 78  Resp: 18   Temp: 98.2 F (36.8 C) 98.3 F (36.8 C)  SpO2: 97% 96%    History of present illness:  64 year old female with past medical history of hypertension, severe GERD depression and obesity presented to the emergency room on 4/3 early morning with complaints of shortness of breath have been worsening for the past 2 weeks.  In the emergency room, she was evaluated and found to have no signs of PE, pneumonia, hypoxia or Covid.  She is brought in for further evaluation.  Hospital Course:  Principal Problem:   Shortness of breath:  Suspect that her shortness of breath may be related actually to her severe GERD.  Patient has a hiatal hernia as well as a bad gastropyloric valve that she is getting surgical treatment for in the coming weeks.  I suspect that she is having reflux that is leading to bronchospasm.  We did not find any signs of hypoxia or pulmonary issues.  Echocardiogram was unremarkable.  Active Problems:   Essential hypertension: Patient has been on the same blood pressure medication and dose now for almost a decade.  She says especially in the last year, she has put on some weight.  Would favor continuing her Cozaar, but adding Imdur.    GERD: As above.  Patient has such severe GERD, that she takes a PPI several times a day.  She is needing and getting surgical correction soon, which will hopefully resolve the issue.  The meantime, I recommended that she try taking her PPI at 2 pills twice a day rather than spread out to see if that would make a difference.    Hyperlipemia: Stable, continue statin.  Diabetes mellitus: Patient's A1c was checked and found to be at 6.9.  Her previous A1c was 6.4, but in review of her previous A1c is in the last 5 to 6 years, she has had at times an A1c as high as 7.1, but has never been told that she is actually diabetic only prediabetic.  With these findings, I recommended the patient be formally classified as diabetes and started on Metformin, which she said that she  is amenable to.    Obesity: Patient is criteria BMI greater than 30.    Depression   AKI (acute kidney injury) ([place]): Creatinine on admission at 1.3 with a GFR of 43.  Patient got some IV fluids and creatinine since improved down to 1.06 by end of the day 4/3, but slightly up to 1.18 on day of discharge.  I do not think she has at this time some underlying kidney injury of the last time that she had labs was over a year ago.  Continue her Cozaar upon discharge and will she will follow-up with her PCP this week.     Chronic diastolic CHF (congestive heart failure) ([place]): BNP normal and no signs of fluid on CT.  That said, incidentally noted diastolic heart failure, likely from poorly controlled blood pressure on her echocardiogram.  Made adjustments as above.   Procedures:  Echocardiogram done 4/4: Grade 1 diastolic dysfunction  Consultations:  None  Discharge Exam: BP (!) 136/108 (BP Location: Left Arm)   Pulse 78   Temp 98.3 F (36.8 C) (Oral)   Resp 18   Ht 5\' 6"  (1.676 m)   Wt 93.1 kg   SpO2 96%   BMI 33.14 kg/m   General: Alert and oriented x3, no acute distress Cardiovascular: Regular rate and rhythm, S1-S2 Respiratory: Clear to auscultation bilaterally  Discharge Instructions You were cared for by a hospitalist during your hospital stay. If you have any questions about your discharge medications or the care you received while you were in the hospital after you are discharged, you can call the unit and asked to speak with the hospitalist on call if the hospitalist that took care of you is not available. Once you are discharged, your primary care physician will handle any further medical issues. Please note that NO REFILLS for any discharge medications will be authorized once you are discharged, as it is imperative that you return to your primary care physician (or establish a relationship with a primary care physician if you do not have one) for your aftercare needs so that they can reassess your need for medications and monitor your lab values.  Discharge Instructions    Diet - low sodium heart healthy   Complete by: As directed    Increase activity slowly   Complete by: As directed      Allergies as of 06/17/2019      Reactions   Aspartame And Phenylalanine Other (See Comments)   Headache   Codeine Nausea Only      Medication List    TAKE these medications   atorvastatin 10 MG tablet Commonly known as: LIPITOR TAKE 1 TABLET BY MOUTH EVERY DAY   benzonatate 200 MG  capsule Commonly known as: TESSALON Take 1 capsule (200 mg total) by mouth every 6 (six) hours as needed for cough.   BIOFREEZE EX Apply 1 application topically at bedtime as needed (pain).   Calcium Citrate + D3 200-250 MG-UNIT Tabs Generic drug: Calcium Citrate-Vitamin D Take 1 tablet by mouth in the morning and at bedtime.   citalopram 20 MG tablet Commonly known as: CELEXA TAKE 1 TABLET BY MOUTH EVERY DAY   GAS-X PO Take 1 tablet by mouth 2 (two) times daily as needed (gas/bloating).   IBUPROFEN-ACETAMINOPHEN PO Take 2 tablets by mouth 2 (two) times daily as needed (pain/headache). OTC combination sold as "ADVIL"   isosorbide mononitrate 30 MG 24 hr tablet Commonly known as: IMDUR Take 1 tablet (30 mg total) by mouth  daily.   losartan 25 MG tablet Commonly known as: COZAAR Take 25 mg by mouth daily.   melatonin 5 MG Tabs Take 10 mg by mouth at bedtime.   metFORMIN 500 MG 24 hr tablet Commonly known as: Glucophage XR Take 1 tablet (500 mg total) by mouth daily with breakfast.   omeprazole 20 MG capsule Commonly known as: PRILOSEC TAKE 1 CAPSULE (20 MG TOTAL) BY MOUTH 2 (TWO) TIMES DAILY BEFORE A MEAL. What changed: when to take this   Systane 0.4-0.3 % Soln Generic drug: Polyethyl Glycol-Propyl Glycol Place 1 drop into both eyes daily as needed (dry eyes).   TRAZODONE HCL PO Take 1 tablet by mouth once.   Vitamin D 50 MCG (2000 UT) tablet Take 2,000 Units by mouth daily.   ZICAM ALLERGY RELIEF NA Place 1 application into the nose 2 (two) times daily as needed (congestion/allergies).   ARNICA EX Apply 1 application topically 2 (two) times daily as needed (foot pain).      Allergies  Allergen Reactions  . Aspartame And Phenylalanine Other (See Comments)    Headache  . Codeine Nausea Only      The results of significant diagnostics from this hospitalization (including imaging, microbiology, ancillary and laboratory) are listed below for reference.     Significant Diagnostic Studies: CT Angio Chest PE W and/or Wo Contrast  Result Date: 06/16/2019 CLINICAL DATA:  Shortness of breath on exertion.  Elevated D-dimer. EXAM: CT ANGIOGRAPHY CHEST WITH CONTRAST TECHNIQUE: Multidetector CT imaging of the chest was performed using the standard protocol during bolus administration of intravenous contrast. Multiplanar CT image reconstructions and MIPs were obtained to evaluate the vascular anatomy. CONTRAST:  45mL OMNIPAQUE IOHEXOL 350 MG/ML SOLN COMPARISON:  02/20/2015 FINDINGS: Cardiovascular: There is satisfactory opacification of the pulmonary arteries to the segmental level. There is no evidence of a pulmonary embolism. Heart is normal in size and configuration. No coronary artery calcifications. No pericardial effusion. Great vessels are normal in caliber. No aortic dissection or significant atherosclerosis. Mediastinum/Nodes: No neck base, mediastinal or hilar masses or enlarged lymph nodes. Trachea and esophagus are unremarkable. Lungs/Pleura: Bandlike atelectasis in the right lower lobe, improved compared to the prior CTA. Minor atelectasis in the left lower lobe and left upper lobe lingula adjacent to the heart. Linear atelectasis or scarring in the right middle lobe. Remainder of the lungs is clear. No pleural effusion or pneumothorax. Upper Abdomen: No acute abnormality. Musculoskeletal: No fracture or acute finding. No osteoblastic or osteolytic lesions. Review of the MIP images confirms the above findings. IMPRESSION: 1. No acute findings.  No evidence of a pulmonary embolism. 2. Mild areas of lung scarring/atelectasis. No evidence of pneumonia or pulmonary edema. Electronically Signed   By: Lajean Manes M.D.   On: 06/16/2019 11:55   DG Chest Portable 1 View  Result Date: 06/16/2019 CLINICAL DATA:  Shortness of breath, chest pain. EXAM: PORTABLE CHEST 1 VIEW COMPARISON:  Chest x-ray dated 07/04/2015. FINDINGS: Heart size and mediastinal contours are  within normal limits. Lungs are clear. No pleural effusion or pneumothorax is seen. Osseous structures about the chest are unremarkable. Stable chronic elevation of the RIGHT hemidiaphragm. IMPRESSION: No active disease. No evidence of pneumonia or pulmonary edema. Electronically Signed   By: Franki Cabot M.D.   On: 06/16/2019 10:20   ECHOCARDIOGRAM COMPLETE  Result Date: 06/17/2019    ECHOCARDIOGRAM REPORT   Patient Name:   Dayne A Finks Date of Exam: 06/17/2019 Medical Rec #:  [id number]  Height:       66.0 in Accession #:    [id number]       Weight:       205.3 lb Date of Birth:  08-Jun-1955        BSA:          2.022 m Patient Age:    47 years         BP:           157/88 mmHg Patient Gender: F                HR:           75 bpm. Exam Location:  Inpatient Procedure: 2D Echo, Color Doppler and Cardiac Doppler Indications:    Dyspnea 786.09/R06.00  History:        Patient has no prior history of Echocardiogram examinations.                 Signs/Symptoms:Shortness of Breath; Risk Factors:Hypertension                 and Dyslipidemia. Prediabetes. GERD.  Sonographer:    Clayton Lefort RDCS (AE) Referring Phys: [id number] Michell Heinrich [facility]  Sonographer Comments: Technically difficult study due to poor echo windows and patient is morbidly obese. Image acquisition challenging due to patient body habitus and Image acquisition challenging due to respiratory motion. IMPRESSIONS  1. Normal LV systolic function; mild LVH; grade 1 diastolic dysfunction.  2. Left ventricular ejection fraction, by estimation, is 60 to 65%. The left ventricle has normal function. The left ventricle has no regional wall motion abnormalities. There is mild left ventricular hypertrophy. Left ventricular diastolic parameters are consistent with Grade I diastolic dysfunction (impaired relaxation).  3. Right ventricular systolic function is normal. The right ventricular size is normal. There is normal pulmonary artery systolic pressure.  4. The  mitral valve is normal in structure. Trivial mitral valve regurgitation. No evidence of mitral stenosis.  5. The aortic valve was not well visualized. Aortic valve regurgitation is not visualized. No aortic stenosis is present.  6. The inferior vena cava is normal in size with greater than 50% respiratory variability, suggesting right atrial pressure of 3 mmHg. FINDINGS  Left Ventricle: Left ventricular ejection fraction, by estimation, is 60 to 65%. The left ventricle has normal function. The left ventricle has no regional wall motion abnormalities. The left ventricular internal cavity size was normal in size. There is  mild left ventricular hypertrophy. Left ventricular diastolic parameters are consistent with Grade I diastolic dysfunction (impaired relaxation). Right Ventricle: The right ventricular size is normal. Right ventricular systolic function is normal. There is normal pulmonary artery systolic pressure. The tricuspid regurgitant velocity is 2.08 m/s, and with an assumed right atrial pressure of 3 mmHg,  the estimated right ventricular systolic pressure is 0000000 mmHg. Left Atrium: Left atrial size was normal in size. Right Atrium: Right atrial size was normal in size. Pericardium: Trivial pericardial effusion is present. Mitral Valve: The mitral valve is normal in structure. Normal mobility of the mitral valve leaflets. Trivial mitral valve regurgitation. No evidence of mitral valve stenosis. MV peak gradient, 2.6 mmHg. The mean mitral valve gradient is 1.0 mmHg. Tricuspid Valve: The tricuspid valve is normal in structure. Tricuspid valve regurgitation is trivial. No evidence of tricuspid stenosis. Aortic Valve: The aortic valve was not well visualized. Aortic valve regurgitation is not visualized. No aortic stenosis is present. Aortic valve mean gradient measures 4.0 mmHg. Aortic valve peak gradient measures 6.2  mmHg. Aortic valve area, by VTI measures 2.15 cm. Pulmonic Valve: The pulmonic valve was not  well visualized. Pulmonic valve regurgitation is not visualized. No evidence of pulmonic stenosis. Aorta: The aortic root is normal in size and structure. Venous: The inferior vena cava is normal in size with greater than 50% respiratory variability, suggesting right atrial pressure of 3 mmHg. IAS/Shunts: No atrial level shunt detected by color flow Doppler. Additional Comments: Normal LV systolic function; mild LVH; grade 1 diastolic dysfunction.  LEFT VENTRICLE PLAX 2D LVIDd:         4.50 cm  Diastology LVIDs:         3.00 cm  LV e' lateral:   10.80 cm/s LV PW:         1.40 cm  LV E/e' lateral: 5.7 LV IVS:        1.50 cm  LV e' medial:    7.83 cm/s LVOT diam:     2.00 cm  LV E/e' medial:  7.8 LV SV:         60 LV SV Index:   30 LVOT Area:     3.14 cm  RIGHT VENTRICLE             IVC RV Basal diam:  2.50 cm     IVC diam: 1.60 cm RV S prime:     10.10 cm/s TAPSE (M-mode): 1.8 cm LEFT ATRIUM             Index       RIGHT ATRIUM           Index LA diam:        4.20 cm 2.08 cm/m  RA Area:     10.50 cm LA Vol (A2C):   32.4 ml 16.02 ml/m RA Volume:   18.90 ml  9.35 ml/m LA Vol (A4C):   32.6 ml 16.12 ml/m LA Biplane Vol: 33.2 ml 16.42 ml/m  AORTIC VALVE AV Area (Vmax):    2.23 cm AV Area (Vmean):   2.10 cm AV Area (VTI):     2.15 cm AV Vmax:           124.00 cm/s AV Vmean:          90.300 cm/s AV VTI:            0.279 m AV Peak Grad:      6.2 mmHg AV Mean Grad:      4.0 mmHg LVOT Vmax:         88.10 cm/s LVOT Vmean:        60.400 cm/s LVOT VTI:          0.191 m LVOT/AV VTI ratio: 0.68  AORTA Ao Root diam: 2.90 cm Ao Asc diam:  3.60 cm MITRAL VALVE               TRICUSPID VALVE MV Area (PHT): 3.72 cm    TR Peak grad:   17.3 mmHg MV Peak grad:  2.6 mmHg    TR Vmax:        208.00 cm/s MV Mean grad:  1.0 mmHg MV Vmax:       0.81 m/s    SHUNTS MV Vmean:      46.2 cm/s   Systemic VTI:  0.19 m MV Decel Time: 204 msec    Systemic Diam: 2.00 cm MV E velocity: 61.30 cm/s MV A velocity: 59.10 cm/s MV E/A ratio:  1.04  Kirk Ruths MD Electronically signed by Kirk Ruths MD Signature Date/Time:  06/17/2019/11:26:16 AM    Final     Microbiology: Recent Results (from the past 240 hour(s))  SARS CORONAVIRUS 2 (TAT 6-24 HRS) Nasopharyngeal Nasopharyngeal Swab     Status: None   Collection Time: 06/16/19 10:11 AM   Specimen: Nasopharyngeal Swab  Result Value Ref Range Status   SARS Coronavirus 2 NEGATIVE NEGATIVE Final    Comment: (NOTE) SARS-CoV-2 target nucleic acids are NOT DETECTED. The SARS-CoV-2 RNA is generally detectable in upper and lower respiratory specimens during the acute phase of infection. Negative results do not preclude SARS-CoV-2 infection, do not rule out co-infections with other pathogens, and should not be used as the sole basis for treatment or other patient management decisions. Negative results must be combined with clinical observations, patient history, and epidemiological information. The expected result is Negative. Fact Sheet for Patients: SugarRoll.be Fact Sheet for Healthcare Providers: https://www.woods-mathews.com/ This test is not yet approved or cleared by the [place] FDA and  has been authorized for detection and/or diagnosis of SARS-CoV-2 by FDA under an Emergency Use Authorization (EUA). This EUA will remain  in effect (meaning this test can be used) for the duration of the COVID-19 declaration under Section 56 4(b)(1) of the Act, 21 U.S.C. section 360bbb-3(b)(1), unless the authorization is terminated or revoked sooner. Performed at [facility], [place] [address] [place], [place] [address]      Labs: Basic Metabolic Panel: Recent Labs  Lab 06/16/19 0935 06/16/19 1814 06/17/19 0427  NA 132*  --  140  K 3.6  --  3.8  CL 101  --  106  CO2 19*  --  24  GLUCOSE 344*  --  119*  BUN 21  --  16  CREATININE 1.30* 1.06* 1.18*  CALCIUM 9.7  --  9.6  MG  --  1.8  --   PHOS  --  3.4  --    Liver Function  Tests: Recent Labs  Lab 06/16/19 0935  AST 39  ALT 38  ALKPHOS 204*  BILITOT 0.6  PROT 7.8  ALBUMIN 4.1   No results for input(s): LIPASE, AMYLASE in the last 168 hours. No results for input(s): AMMONIA in the last 168 hours. CBC: Recent Labs  Lab 06/16/19 0935 06/16/19 1814 06/17/19 0427  WBC 9.3 9.9 10.0  NEUTROABS 7.3  --   --   HGB 14.3 13.1 12.6  HCT 44.5 40.1 40.1  MCV 84.9 83.4 86.4  PLT 268 259 257   Cardiac Enzymes: No results for input(s): CKTOTAL, CKMB, CKMBINDEX, TROPONINI in the last 168 hours. BNP: BNP (last 3 results) No results for input(s): BNP in the last 8760 hours.  ProBNP (last 3 results) No results for input(s): PROBNP in the last 8760 hours.  CBG: Recent Labs  Lab 06/16/19 1443 06/16/19 1655 06/16/19 2121 06/17/19 0634 06/17/19 1129  GLUCAP 122* 112* 245* 115* 79       Signed:  Annita Brod, MD [facility] 06/17/2019, 2:56 PM

## 2019-06-17 NOTE — Progress Notes (Signed)
  Echocardiogram 2D Echocardiogram has been performed.  Karen Hall 06/17/2019, 9:40 AM

## 2019-06-18 ENCOUNTER — Telehealth: Payer: Self-pay | Admitting: *Deleted

## 2019-06-18 NOTE — Telephone Encounter (Signed)
Transition Care Management Follow-up Telephone Call   Date discharged? 06/17/19   How have you been since you were released from the hospital? Not the best. I have a headache.   Do you understand why you were in the hospital? yes   Do you understand the discharge instructions? yes   Where were you discharged to? Home.   Items Reviewed:  Medications reviewed: They added a few meds. I will have my list on Wed for PCP visit.  Allergies reviewed: yes  Dietary changes reviewed: yes  Referrals reviewed: yes   Functional Questionnaire:   Activities of Daily Living (ADLs):   She states they are independent in the following: ambulation, bathing and hygiene, feeding, continence, grooming, toileting and dressing States they require assistance with the following: na   Any transportation issues/concerns?: no   Any patient concerns? no   Confirmed importance and date/time of follow-up visits scheduled yes  Provider Appointment booked with PCP 06/20/19  Confirmed with patient if condition begins to worsen call PCP or go to the ER.  Patient was given the office number and encouraged to call back with question or concerns.  : yes

## 2019-06-20 ENCOUNTER — Other Ambulatory Visit: Payer: Self-pay

## 2019-06-20 ENCOUNTER — Ambulatory Visit (INDEPENDENT_AMBULATORY_CARE_PROVIDER_SITE_OTHER): Payer: Medicare Other | Admitting: Family Medicine

## 2019-06-20 ENCOUNTER — Encounter: Payer: Self-pay | Admitting: Family Medicine

## 2019-06-20 DIAGNOSIS — E669 Obesity, unspecified: Secondary | ICD-10-CM | POA: Diagnosis not present

## 2019-06-20 DIAGNOSIS — E1169 Type 2 diabetes mellitus with other specified complication: Secondary | ICD-10-CM | POA: Diagnosis not present

## 2019-06-20 DIAGNOSIS — J9801 Acute bronchospasm: Secondary | ICD-10-CM | POA: Diagnosis not present

## 2019-06-20 DIAGNOSIS — I10 Essential (primary) hypertension: Secondary | ICD-10-CM | POA: Diagnosis not present

## 2019-06-20 MED ORDER — LOSARTAN POTASSIUM 25 MG PO TABS: 50.0000 mg | ORAL_TABLET | Freq: Every day | ORAL | Status: DC

## 2019-06-20 NOTE — Progress Notes (Signed)
CC: TCM  HPI Karen Hall is a 64 y.o. y.o. female who presents for a transition of care visit.  Pt was discharged from [facility] on 06/17/19.  Within 48 business hours of discharge our office contacted pt via telephone to coordinate care and needs. Due to COVID-19 pandemic, we are interacting via web portal for an electronic face-to-face visit. I verified patient's ID using 2 identifiers. Patient agreed to proceed with visit via this method. Patient is at home, I am at office. Patient and I are present for visit.   Pt admitted to [facility] for dyspnea. Heart and lung pathosis ruled out. Thought to be bronchospasms 2/2 reflux.  She has a GI procedure next week.  Her blood pressure was found to be high in the hospital.  Imdur was added.  She has been having severe headaches since starting this medication.  She is currently taking losartan 25 mg daily and reports no adverse effects.  She is taking her blood pressure at home with them routinely running around 150/80s-90s.  No chest pain or shortness of breath currently.  Past Medical History:  Diagnosis Date  . Allergy   . ANXIETY 10/18/2007   Qualifier: Diagnosis of  By: Sarajane Jews MD, Ishmael Holter   . Autoimmune hepatitis ([place])   . Cataract    "beginnings of"  . Chronic low back pain   . Diabetes ([place]) 02/24/2016  . GERD 10/18/2007   Qualifier: Diagnosis of  By: Sherlynn Stalls, CMA, [employer]    . Hyperlipemia 03/12/2014  . HYPERTENSION 10/18/2007   Qualifier: Diagnosis of  By: Sherlynn Stalls, CMA, [place]    . Insomnia 03/12/2014  . Irritable bowel syndrome 10/18/2007   Qualifier: Diagnosis of  By: Sarajane Jews MD, Ishmael Holter   . Osteopenia   . Recurrent knee pain    R knee hx meniscal tear 2002 with repair   Past Surgical History:  Procedure Laterality Date  . BREAST EXCISIONAL BIOPSY Left    Fibroid removed, 24 years ago   . BREAST SURGERY     fibroid  . CATARACT EXTRACTION Bilateral 09/2018  . CHOLECYSTECTOMY    . COLONOSCOPY     Around 2010  . FOOT SURGERY Right   . LIVER BIOPSY   02/2018  . MENISCUS REPAIR Right   . TONSILLECTOMY     Family History  Problem Relation Age of Onset  . Cancer Mother        melanoma  . Hyperlipidemia Mother   . Breast cancer Mother   . Liver cancer Father   . Cancer Maternal Grandfather        unknown type  . Heart disease Paternal Grandfather   . Diabetes Maternal Aunt   . Diabetes Sister   . Lung disease Neg Hx   . Colon cancer Neg Hx   . Esophageal cancer Neg Hx   . Rectal cancer Neg Hx   . Stomach cancer Neg Hx    Allergies as of 06/20/2019      Reactions   Aspartame And Phenylalanine Other (See Comments)   Headache   Codeine Nausea Only      Medication List       Accurate as of June 20, 2019 11:52 AM. If you have any questions, ask your nurse or doctor.        STOP taking these medications   benzonatate 200 MG capsule Commonly known as: TESSALON Stopped by: Shelda Pal, DO   isosorbide mononitrate 30 MG 24 hr tablet Commonly known as: IMDUR Stopped  by: Shelda Pal, DO   metFORMIN 500 MG 24 hr tablet Commonly known as: Glucophage XR Stopped by: Shelda Pal, DO     TAKE these medications   atorvastatin 10 MG tablet Commonly known as: LIPITOR TAKE 1 TABLET BY MOUTH EVERY DAY   BIOFREEZE EX Apply 1 application topically at bedtime as needed (pain).   Calcium Citrate + D3 200-250 MG-UNIT Tabs Generic drug: Calcium Citrate-Vitamin D Take 1 tablet by mouth in the morning and at bedtime.   citalopram 20 MG tablet Commonly known as: CELEXA TAKE 1 TABLET BY MOUTH EVERY DAY   GAS-X PO Take 1 tablet by mouth 2 (two) times daily as needed (gas/bloating).   IBUPROFEN-ACETAMINOPHEN PO Take 2 tablets by mouth 2 (two) times daily as needed (pain/headache). OTC combination sold as "ADVIL"   losartan 25 MG tablet Commonly known as: Cozaar Take 2 tablets (50 mg total) by mouth daily. What changed: how much to take Changed by: Shelda Pal, DO   melatonin 5 MG  Tabs Take 10 mg by mouth at bedtime.   omeprazole 20 MG capsule Commonly known as: PRILOSEC TAKE 1 CAPSULE (20 MG TOTAL) BY MOUTH 2 (TWO) TIMES DAILY BEFORE A MEAL. What changed: when to take this   Systane 0.4-0.3 % Soln Generic drug: Polyethyl Glycol-Propyl Glycol Place 1 drop into both eyes daily as needed (dry eyes).   TRAZODONE HCL PO Take 1 tablet by mouth once.   Vitamin D 50 MCG (2000 UT) tablet Take 2,000 Units by mouth daily.   ZICAM ALLERGY RELIEF NA Place 1 application into the nose 2 (two) times daily as needed (congestion/allergies).   ARNICA EX Apply 1 application topically 2 (two) times daily as needed (foot pain).       ROS:  Constitutional: No fevers or chills, no weight loss HEENT: No headaches, hearing loss, or runny nose, no sore throat Heart: No chest pain Lungs: No SOB, no cough Abd: No bowel changes, no pain, no N/V GU: No urinary complaints Neuro: No numbness, tingling or weakness Msk: No joint or muscle pain  Objective No conversational dyspnea Age appropriate judgment and insight Nml affect and mood  Bronchospasm  Essential hypertension  Diabetes mellitus type 2 in obese [facility])  Discharge summary and medication list have been reviewed/reconciled.  Labs pending at the time of discharge have been reviewed or are still pending at the time of this visit.  Follow-up labs and appointments have been ordered and/or coordinated appropriately.  TRANSITIONAL CARE MANAGEMENT CERTIFICATION:  I certify the following are true:   1. Communication with the patient/care giver was made within 2 business days of discharge.  2. Complexity of Medical decision making is moderate.  3. Face to face visit occurred within 14 days of discharge.   Breathing is better.  Procedure with the gastroenterology team next week. Increased dose of losartan from 25 mg daily to 50 mg daily.  Stop Imdur.  Counseled on diet and exercise. Stop Metformin.  She will be on a  clear liquid diet for 8 weeks after her procedure next week. Total time: 18 min F/u next week for BMP, 1 mo for BP reck. The patient voiced understanding and agreement to the plan.  [place], DO 06/20/19 11:52 AM

## 2019-06-21 ENCOUNTER — Ambulatory Visit: Payer: Medicare Other | Admitting: *Deleted

## 2019-06-25 ENCOUNTER — Other Ambulatory Visit (HOSPITAL_COMMUNITY)
Admission: RE | Admit: 2019-06-25 | Discharge: 2019-06-25 | Disposition: A | Discharge status: Home / Self Care | Payer: Medicare Other | Source: Ambulatory Visit | Attending: Gastroenterology | Admitting: Gastroenterology

## 2019-06-25 DIAGNOSIS — Z01812 Encounter for preprocedural laboratory examination: Secondary | ICD-10-CM | POA: Diagnosis not present

## 2019-06-25 DIAGNOSIS — Z20822 Contact with and (suspected) exposure to covid-19: Secondary | ICD-10-CM | POA: Insufficient documentation

## 2019-06-25 LAB — SARS CORONAVIRUS 2 (TAT 6-24 HRS): SARS Coronavirus 2: NEGATIVE

## 2019-06-26 NOTE — Anesthesia Preprocedure Evaluation (Addendum)
Anesthesia Evaluation  Patient identified by MRN, date of birth, ID band Patient awake    Reviewed: Allergy & Precautions, NPO status , Patient's Chart, lab work & pertinent test results  History of Anesthesia Complications Negative for: history of anesthetic complications  Airway Mallampati: II  TM Distance: >3 FB Neck ROM: Full    Dental no notable dental hx. (+) Dental Advisory Given   Pulmonary neg pulmonary ROS,    Pulmonary exam normal        Cardiovascular hypertension, Pt. on medications negative cardio ROS Normal cardiovascular exam     Neuro/Psych PSYCHIATRIC DISORDERS Anxiety Depression negative neurological ROS     GI/Hepatic GERD  ,(+) Hepatitis -, Autoimmune  Endo/Other  diabetes  Renal/GU Renal InsufficiencyRenal disease     Musculoskeletal negative musculoskeletal ROS (+)   Abdominal   Peds  Hematology negative hematology ROS (+)   Anesthesia Other Findings Day of surgery medications reviewed with the patient.  Reproductive/Obstetrics                           Anesthesia Physical Anesthesia Plan  ASA: III  Anesthesia Plan: General   Post-op Pain Management:    Induction: Intravenous  PONV Risk Score and Plan: 3 and Ondansetron, Propofol infusion and Scopolamine patch - Pre-op  Airway Management Planned: Oral ETT  Additional Equipment:   Intra-op Plan:   Post-operative Plan: Extubation in OR  Informed Consent: I have reviewed the patients History and Physical, chart, labs and discussed the procedure including the risks, benefits and alternatives for the proposed anesthesia with the patient or authorized representative who has indicated his/her understanding and acceptance.     Dental advisory given  Plan Discussed with: Anesthesiologist and CRNA  Anesthesia Plan Comments:       Anesthesia Quick Evaluation

## 2019-06-27 ENCOUNTER — Observation Stay (HOSPITAL_COMMUNITY)
Admission: RE | Admit: 2019-06-27 | Discharge: 2019-06-28 | Disposition: A | Discharge status: Home / Self Care | Payer: Medicare Other | Attending: Gastroenterology | Admitting: Gastroenterology

## 2019-06-27 ENCOUNTER — Other Ambulatory Visit: Payer: Self-pay

## 2019-06-27 ENCOUNTER — Encounter (HOSPITAL_COMMUNITY): Payer: Self-pay | Admitting: Gastroenterology

## 2019-06-27 ENCOUNTER — Ambulatory Visit (HOSPITAL_COMMUNITY): Payer: Medicare Other | Admitting: Anesthesiology

## 2019-06-27 ENCOUNTER — Encounter (HOSPITAL_COMMUNITY)
Admission: RE | Disposition: A | Discharge status: Home / Self Care | Payer: Self-pay | Source: Home / Self Care | Attending: Internal Medicine

## 2019-06-27 DIAGNOSIS — Z9841 Cataract extraction status, right eye: Secondary | ICD-10-CM | POA: Diagnosis not present

## 2019-06-27 DIAGNOSIS — R05 Cough: Secondary | ICD-10-CM | POA: Diagnosis not present

## 2019-06-27 DIAGNOSIS — Z808 Family history of malignant neoplasm of other organs or systems: Secondary | ICD-10-CM | POA: Insufficient documentation

## 2019-06-27 DIAGNOSIS — Z8249 Family history of ischemic heart disease and other diseases of the circulatory system: Secondary | ICD-10-CM | POA: Insufficient documentation

## 2019-06-27 DIAGNOSIS — F419 Anxiety disorder, unspecified: Secondary | ICD-10-CM | POA: Insufficient documentation

## 2019-06-27 DIAGNOSIS — E669 Obesity, unspecified: Secondary | ICD-10-CM | POA: Insufficient documentation

## 2019-06-27 DIAGNOSIS — E785 Hyperlipidemia, unspecified: Secondary | ICD-10-CM

## 2019-06-27 DIAGNOSIS — G8929 Other chronic pain: Secondary | ICD-10-CM | POA: Diagnosis not present

## 2019-06-27 DIAGNOSIS — Z9842 Cataract extraction status, left eye: Secondary | ICD-10-CM | POA: Diagnosis not present

## 2019-06-27 DIAGNOSIS — Z9049 Acquired absence of other specified parts of digestive tract: Secondary | ICD-10-CM | POA: Diagnosis not present

## 2019-06-27 DIAGNOSIS — K219 Gastro-esophageal reflux disease without esophagitis: Secondary | ICD-10-CM

## 2019-06-27 DIAGNOSIS — Z6832 Body mass index (BMI) 32.0-32.9, adult: Secondary | ICD-10-CM | POA: Insufficient documentation

## 2019-06-27 DIAGNOSIS — Z79899 Other long term (current) drug therapy: Secondary | ICD-10-CM | POA: Diagnosis not present

## 2019-06-27 DIAGNOSIS — Z888 Allergy status to other drugs, medicaments and biological substances status: Secondary | ICD-10-CM | POA: Insufficient documentation

## 2019-06-27 DIAGNOSIS — Z885 Allergy status to narcotic agent status: Secondary | ICD-10-CM | POA: Diagnosis not present

## 2019-06-27 DIAGNOSIS — E119 Type 2 diabetes mellitus without complications: Secondary | ICD-10-CM | POA: Diagnosis not present

## 2019-06-27 DIAGNOSIS — Z803 Family history of malignant neoplasm of breast: Secondary | ICD-10-CM | POA: Insufficient documentation

## 2019-06-27 DIAGNOSIS — Z791 Long term (current) use of non-steroidal anti-inflammatories (NSAID): Secondary | ICD-10-CM | POA: Insufficient documentation

## 2019-06-27 DIAGNOSIS — Z809 Family history of malignant neoplasm, unspecified: Secondary | ICD-10-CM | POA: Diagnosis not present

## 2019-06-27 DIAGNOSIS — M8580 Other specified disorders of bone density and structure, unspecified site: Secondary | ICD-10-CM | POA: Diagnosis not present

## 2019-06-27 DIAGNOSIS — M545 Low back pain: Secondary | ICD-10-CM | POA: Insufficient documentation

## 2019-06-27 DIAGNOSIS — Z8349 Family history of other endocrine, nutritional and metabolic diseases: Secondary | ICD-10-CM | POA: Insufficient documentation

## 2019-06-27 DIAGNOSIS — F329 Major depressive disorder, single episode, unspecified: Secondary | ICD-10-CM | POA: Insufficient documentation

## 2019-06-27 DIAGNOSIS — Z833 Family history of diabetes mellitus: Secondary | ICD-10-CM | POA: Diagnosis not present

## 2019-06-27 DIAGNOSIS — I5189 Other ill-defined heart diseases: Secondary | ICD-10-CM

## 2019-06-27 DIAGNOSIS — I10 Essential (primary) hypertension: Secondary | ICD-10-CM

## 2019-06-27 DIAGNOSIS — Z800 Family history of malignant neoplasm of digestive organs: Secondary | ICD-10-CM | POA: Diagnosis not present

## 2019-06-27 DIAGNOSIS — J309 Allergic rhinitis, unspecified: Secondary | ICD-10-CM | POA: Diagnosis not present

## 2019-06-27 DIAGNOSIS — K2100 Gastro-esophageal reflux disease with esophagitis, without bleeding: Secondary | ICD-10-CM | POA: Diagnosis not present

## 2019-06-27 DIAGNOSIS — K449 Diaphragmatic hernia without obstruction or gangrene: Secondary | ICD-10-CM | POA: Diagnosis not present

## 2019-06-27 DIAGNOSIS — R053 Chronic cough: Secondary | ICD-10-CM

## 2019-06-27 HISTORY — PX: ESOPHAGOGASTRODUODENOSCOPY (EGD) WITH PROPOFOL: SHX5813

## 2019-06-27 HISTORY — DX: Gastro-esophageal reflux disease without esophagitis: K21.9

## 2019-06-27 HISTORY — PX: MALONEY DILATION: SHX5535

## 2019-06-27 HISTORY — PX: TRANSORAL INCISIONLESS FUNDOPLICATION: SHX6840

## 2019-06-27 LAB — GLUCOSE, CAPILLARY: Glucose-Capillary: 169 mg/dL — ABNORMAL HIGH (ref 70–99)

## 2019-06-27 SURGERY — ESOPHAGOGASTRODUODENOSCOPY (EGD) WITH PROPOFOL
Anesthesia: General

## 2019-06-27 MED ORDER — LABETALOL HCL 5 MG/ML IV SOLN
INTRAVENOUS | Status: DC | PRN
  Administered 2019-06-27: 5 mg via INTRAVENOUS
  Administered 2019-06-27: 10 mg via INTRAVENOUS

## 2019-06-27 MED ORDER — SODIUM CHLORIDE 0.9 % IV SOLN: INTRAVENOUS | Status: DC

## 2019-06-27 MED ORDER — MIDAZOLAM HCL 5 MG/5ML IJ SOLN
INTRAMUSCULAR | Status: DC | PRN
  Administered 2019-06-27 (×2): 1 mg via INTRAVENOUS

## 2019-06-27 MED ORDER — ONDANSETRON HCL 4 MG/2ML IJ SOLN
INTRAMUSCULAR | Status: AC
  Filled 2019-06-27: qty 2

## 2019-06-27 MED ORDER — SCOPOLAMINE 1 MG/3DAYS TD PT72: 1.0000 | MEDICATED_PATCH | TRANSDERMAL | Status: DC

## 2019-06-27 MED ORDER — SIMETHICONE 80 MG PO CHEW: 80.0000 mg | CHEWABLE_TABLET | Freq: Four times a day (QID) | ORAL | Status: DC | PRN

## 2019-06-27 MED ORDER — MIDAZOLAM HCL 2 MG/2ML IJ SOLN
INTRAMUSCULAR | Status: AC
  Filled 2019-06-27: qty 2

## 2019-06-27 MED ORDER — FENTANYL CITRATE (PF) 100 MCG/2ML IJ SOLN
12.5000 ug | INTRAMUSCULAR | Status: DC | PRN
  Administered 2019-06-27: 12.5 ug via INTRAVENOUS
  Filled 2019-06-27: qty 2

## 2019-06-27 MED ORDER — DEXAMETHASONE SODIUM PHOSPHATE 10 MG/ML IJ SOLN
8.0000 mg | Freq: Four times a day (QID) | INTRAMUSCULAR | Status: DC
  Administered 2019-06-27 – 2019-06-28 (×3): 8 mg via INTRAVENOUS
  Filled 2019-06-27 (×3): qty 1

## 2019-06-27 MED ORDER — ALBUMIN HUMAN 5 % IV SOLN
INTRAVENOUS | Status: AC
  Filled 2019-06-27: qty 250

## 2019-06-27 MED ORDER — FAMOTIDINE IN NACL 20-0.9 MG/50ML-% IV SOLN
20.0000 mg | Freq: Once | INTRAVENOUS | Status: AC
  Administered 2019-06-27: 20 mg via INTRAVENOUS
  Filled 2019-06-27: qty 50

## 2019-06-27 MED ORDER — LIDOCAINE HCL (CARDIAC) PF 100 MG/5ML IV SOSY
PREFILLED_SYRINGE | INTRAVENOUS | Status: DC | PRN
  Administered 2019-06-27: 60 mg via INTRAVENOUS

## 2019-06-27 MED ORDER — FENTANYL CITRATE (PF) 100 MCG/2ML IJ SOLN
25.0000 ug | INTRAMUSCULAR | Status: DC | PRN
  Administered 2019-06-27: 25 ug via INTRAVENOUS
  Administered 2019-06-27 (×2): 50 ug via INTRAVENOUS
  Administered 2019-06-27: 25 ug via INTRAVENOUS

## 2019-06-27 MED ORDER — ONDANSETRON HCL 4 MG/2ML IJ SOLN
4.0000 mg | Freq: Four times a day (QID) | INTRAMUSCULAR | Status: DC
  Administered 2019-06-27 – 2019-06-28 (×3): 4 mg via INTRAVENOUS
  Filled 2019-06-27 (×3): qty 2

## 2019-06-27 MED ORDER — ACETAMINOPHEN 160 MG/5ML PO SOLN
650.0000 mg | Freq: Four times a day (QID) | ORAL | Status: DC | PRN
  Filled 2019-06-27: qty 20.3

## 2019-06-27 MED ORDER — ONDANSETRON HCL 4 MG/2ML IJ SOLN
4.0000 mg | Freq: Once | INTRAMUSCULAR | Status: AC
  Administered 2019-06-27: 4 mg via INTRAVENOUS

## 2019-06-27 MED ORDER — PROPOFOL 10 MG/ML IV BOLUS
INTRAVENOUS | Status: AC
  Filled 2019-06-27: qty 20

## 2019-06-27 MED ORDER — PROPOFOL 10 MG/ML IV BOLUS
INTRAVENOUS | Status: DC | PRN
  Administered 2019-06-27: 160 mg via INTRAVENOUS

## 2019-06-27 MED ORDER — ONDANSETRON HCL 4 MG/2ML IJ SOLN
INTRAMUSCULAR | Status: DC | PRN
  Administered 2019-06-27: 4 mg via INTRAVENOUS

## 2019-06-27 MED ORDER — SUCCINYLCHOLINE CHLORIDE 20 MG/ML IJ SOLN
INTRAMUSCULAR | Status: DC | PRN
  Administered 2019-06-27: 140 mg via INTRAVENOUS

## 2019-06-27 MED ORDER — DEXAMETHASONE SODIUM PHOSPHATE 10 MG/ML IJ SOLN
INTRAMUSCULAR | Status: DC | PRN
  Administered 2019-06-27: 10 mg via INTRAVENOUS

## 2019-06-27 MED ORDER — PANTOPRAZOLE SODIUM 40 MG PO TBEC
40.0000 mg | DELAYED_RELEASE_TABLET | Freq: Two times a day (BID) | ORAL | Status: DC
  Administered 2019-06-27 – 2019-06-28 (×2): 40 mg via ORAL
  Filled 2019-06-27 (×2): qty 1

## 2019-06-27 MED ORDER — SCOPOLAMINE 1 MG/3DAYS TD PT72
1.0000 | MEDICATED_PATCH | Freq: Once | TRANSDERMAL | Status: DC
  Administered 2019-06-27: 1.5 mg via TRANSDERMAL

## 2019-06-27 MED ORDER — FENTANYL CITRATE (PF) 100 MCG/2ML IJ SOLN: 25.0000 ug | INTRAMUSCULAR | Status: DC | PRN

## 2019-06-27 MED ORDER — METOCLOPRAMIDE HCL 5 MG/ML IJ SOLN
10.0000 mg | Freq: Four times a day (QID) | INTRAMUSCULAR | Status: DC
  Administered 2019-06-27 – 2019-06-28 (×4): 10 mg via INTRAVENOUS
  Filled 2019-06-27 (×4): qty 2

## 2019-06-27 MED ORDER — FENTANYL CITRATE (PF) 100 MCG/2ML IJ SOLN
INTRAMUSCULAR | Status: DC | PRN
  Administered 2019-06-27 (×2): 50 ug via INTRAVENOUS

## 2019-06-27 MED ORDER — FENTANYL CITRATE (PF) 100 MCG/2ML IJ SOLN
INTRAMUSCULAR | Status: AC
  Filled 2019-06-27: qty 2

## 2019-06-27 MED ORDER — PROMETHAZINE HCL 25 MG/ML IJ SOLN: 6.2500 mg | INTRAMUSCULAR | Status: DC | PRN

## 2019-06-27 MED ORDER — SCOPOLAMINE 1 MG/3DAYS TD PT72
MEDICATED_PATCH | TRANSDERMAL | Status: AC
  Filled 2019-06-27: qty 1

## 2019-06-27 MED ORDER — CEFAZOLIN SODIUM-DEXTROSE 2-4 GM/100ML-% IV SOLN
2.0000 g | Freq: Once | INTRAVENOUS | Status: AC
  Administered 2019-06-27: 2 g via INTRAVENOUS
  Filled 2019-06-27: qty 100

## 2019-06-27 MED ORDER — LACTATED RINGERS IV SOLN: INTRAVENOUS | Status: DC

## 2019-06-27 MED ORDER — SUGAMMADEX SODIUM 500 MG/5ML IV SOLN
INTRAVENOUS | Status: DC | PRN
  Administered 2019-06-27: 400 mg via INTRAVENOUS

## 2019-06-27 MED ORDER — LIDOCAINE VISCOUS HCL 2 % MT SOLN
5.0000 mL | Freq: Three times a day (TID) | OROMUCOSAL | Status: DC | PRN
  Filled 2019-06-27: qty 15

## 2019-06-27 MED ORDER — PHENYLEPHRINE HCL (PRESSORS) 10 MG/ML IV SOLN
INTRAVENOUS | Status: DC | PRN
  Administered 2019-06-27: 80 ug via INTRAVENOUS
  Administered 2019-06-27: 120 ug via INTRAVENOUS

## 2019-06-27 MED ORDER — ROCURONIUM BROMIDE 100 MG/10ML IV SOLN
INTRAVENOUS | Status: DC | PRN
  Administered 2019-06-27: 40 mg via INTRAVENOUS

## 2019-06-27 MED ORDER — ALBUMIN HUMAN 5 % IV SOLN: INTRAVENOUS | Status: DC | PRN

## 2019-06-27 SURGICAL SUPPLY — 16 items
BLOCK BITE 60FR ADLT L/F BLUE (MISCELLANEOUS) ×3 IMPLANT
ELECT REM PT RETURN 9FT ADLT (ELECTROSURGICAL)
ELECTRODE REM PT RTRN 9FT ADLT (ELECTROSURGICAL) IMPLANT
FORCEP RJ3 GP 1.8X160 W-NEEDLE (CUTTING FORCEPS) IMPLANT
FORCEPS BIOP RAD 4 LRG CAP 4 (CUTTING FORCEPS) IMPLANT
NDL SCLEROTHERAPY 25GX240 (NEEDLE) IMPLANT
NEEDLE SCLEROTHERAPY 25GX240 (NEEDLE) IMPLANT
PROBE APC STR FIRE (PROBE) IMPLANT
PROBE INJECTION GOLD (MISCELLANEOUS)
PROBE INJECTION GOLD 7FR (MISCELLANEOUS) IMPLANT
SNARE SHORT THROW 13M SML OVAL (MISCELLANEOUS) IMPLANT
SYR 50ML LL SCALE MARK (SYRINGE) IMPLANT
Serosa Fuse Implantable Fastener Kit ×1 IMPLANT
TUBING ENDO SMARTCAP PENTAX (MISCELLANEOUS) ×6 IMPLANT
TUBING IRRIGATION ENDOGATOR (MISCELLANEOUS) ×3 IMPLANT
WATER STERILE IRR 1000ML POUR (IV SOLUTION) IMPLANT

## 2019-06-27 NOTE — Anesthesia Postprocedure Evaluation (Signed)
Anesthesia Post Note  Patient: Karen Hall  Procedure(s) Performed: ESOPHAGOGASTRODUODENOSCOPY (EGD) WITH PROPOFOL (N/A ) TRANSORAL INCISIONLESS FUNDOPLICATION (N/A ) Faith     Patient location during evaluation: PACU Anesthesia Type: General Level of consciousness: sedated Pain management: pain level controlled Vital Signs Assessment: post-procedure vital signs reviewed and stable Respiratory status: spontaneous breathing and respiratory function stable Cardiovascular status: stable Postop Assessment: no apparent nausea or vomiting Anesthetic complications: no    Last Vitals:  Vitals:   06/27/19 1045 06/27/19 1100  BP: 106/88 114/83  Pulse: (!) 58 65  Resp: (!) 21 16  Temp:  37.1 C  SpO2: 93% 93%    Last Pain:  Vitals:   06/27/19 1100  TempSrc:   PainSc: 6                  Vasilios Ottaway DANIEL

## 2019-06-27 NOTE — Interval H&P Note (Signed)
History and Physical Interval Note:  06/27/2019 8:05 AM  Karen Hall  has presented today for surgery, with the diagnosis of Chronic cough, GERD.  The various methods of treatment have been discussed with the patient and family. After consideration of risks, benefits and other options for treatment, the patient has consented to  Procedure(s): ESOPHAGOGASTRODUODENOSCOPY (EGD) WITH PROPOFOL (N/A) TRANSORAL INCISIONLESS FUNDOPLICATION (N/A) as a surgical intervention.  The patient's history has been reviewed, patient examined, no change in status, stable for surgery.  I have reviewed the patient's chart and labs.  Questions were answered to the patient's satisfaction.     Dominic Pea Kalin Amrhein

## 2019-06-27 NOTE — Transfer of Care (Signed)
Immediate Anesthesia Transfer of Care Note  Patient: Karen Hall  Procedure(s) Performed: ESOPHAGOGASTRODUODENOSCOPY (EGD) WITH PROPOFOL (N/A ) TRANSORAL INCISIONLESS FUNDOPLICATION (N/A ) MALONEY DILATION  Patient Location: PACU  Anesthesia Type:General  Level of Consciousness: drowsy, patient cooperative and responds to stimulation  Airway & Oxygen Therapy: Patient Spontanous Breathing and Patient connected to face mask oxygen  Post-op Assessment: Report given to RN and Post -op Vital signs reviewed and stable  Post vital signs: Reviewed and stable  Last Vitals:  Vitals Value Taken Time  BP 138/103 06/27/19 0957  Temp 36.9 C 06/27/19 0957  Pulse 71 06/27/19 1000  Resp 22 06/27/19 1000  SpO2 95 % 06/27/19 1000  Vitals shown include unvalidated device data.  Last Pain:  Vitals:   06/27/19 0758  TempSrc: Oral  PainSc: 0-No pain         Complications: No apparent anesthesia complications

## 2019-06-27 NOTE — Plan of Care (Signed)
  Problem: Health Behavior/Discharge Planning: Goal: Ability to manage health-related needs will improve Outcome: Progressing   Problem: Clinical Measurements: Goal: Will remain free from infection Outcome: Progressing   Problem: Nutrition: Goal: Adequate nutrition will be maintained Outcome: Progressing   Problem: Coping: Goal: Level of anxiety will decrease Outcome: Progressing   Problem: Pain Managment: Goal: General experience of comfort will improve Outcome: Progressing   Problem: Skin Integrity: Goal: Risk for impaired skin integrity will decrease Outcome: Progressing

## 2019-06-27 NOTE — H&P (Signed)
History and Physical    Karen Hall [id number] DOB: Nov 18, 1955 DOA: 06/27/2019  PCP: Shelda Pal, DO  Patient coming from: Endoscopy/Home  I have personally briefly reviewed patient's old medical records in [place]  Chief Complaint: GERD  HPI: Karen Hall is a 64 y.o. female with medical history significant of HTN, T2DM, Obresity, Depression, Grade 1 Diastolic dysfunction and severe GERD presenting with and now s/p Transoral incisional funoplication with Gastroenterology.   Patient with recent admission and during admission it was noted patient had severe GERD, was taking oral PPI 3-4 x a day with meals, has ongoing symptoms for multiple decades without relief.  She additionally had chronic cough and it was though this was contributing.  During the recent admission she was recommended to reduce to BID dosing.  Now post-procedure with Dr. Gerrit Heck and patient does report pain in epigastric region, otherwise no other symptoms.  No cp, sob.  Non-smoker, no alcohol or drug use   Review of Systems: As per HPI otherwise 10 point review of systems negative.    Past Medical History:  Diagnosis Date  . Allergy   . ANXIETY 10/18/2007   Qualifier: Diagnosis of  By: Sarajane Jews MD, Ishmael Holter   . Autoimmune hepatitis ([place])   . Cataract    "beginnings of"  . Chronic low back pain   . Diabetes ([place]) 02/24/2016  . GERD 10/18/2007   Qualifier: Diagnosis of  By: Sherlynn Stalls, CMA, [place]    . Hyperlipemia 03/12/2014  . HYPERTENSION 10/18/2007   Qualifier: Diagnosis of  By: Sherlynn Stalls, CMA, [place]    . Insomnia 03/12/2014  . Irritable bowel syndrome 10/18/2007   Qualifier: Diagnosis of  By: Sarajane Jews MD, Ishmael Holter   . Osteopenia   . Recurrent knee pain    R knee hx meniscal tear 2002 with repair    Past Surgical History:  Procedure Laterality Date  . BREAST EXCISIONAL BIOPSY Left    Fibroid removed, 24 years ago   . BREAST SURGERY     fibroid  . CATARACT EXTRACTION Bilateral  09/2018  . CHOLECYSTECTOMY    . COLONOSCOPY     Around 2010  . FOOT SURGERY Right   . LIVER BIOPSY  02/2018  . MENISCUS REPAIR Right   . TONSILLECTOMY       reports that she has never smoked. She has never used smokeless tobacco. She reports previous alcohol use. She reports that she does not use drugs.  Allergies  Allergen Reactions  . Aspartame And Phenylalanine Other (See Comments)    Headache  . Codeine Nausea Only    Family History  Problem Relation Age of Onset  . Cancer Mother        melanoma  . Hyperlipidemia Mother   . Breast cancer Mother   . Liver cancer Father   . Cancer Maternal Grandfather        unknown type  . Heart disease Paternal Grandfather   . Diabetes Maternal Aunt   . Diabetes Sister   . Lung disease Neg Hx   . Colon cancer Neg Hx   . Esophageal cancer Neg Hx   . Rectal cancer Neg Hx   . Stomach cancer Neg Hx      Prior to Admission medications   Medication Sig Start Date End Date Taking? Authorizing Provider  atorvastatin (LIPITOR) 10 MG tablet TAKE 1 TABLET BY MOUTH EVERY DAY Patient taking differently: Take 10 mg by mouth daily.  06/04/19  Yes Riki Sheer  Paul, DO  Calcium Citrate-Vitamin D (CALCIUM CITRATE + D3) 200-250 MG-UNIT TABS Take 1 tablet by mouth in the morning and at bedtime.   Yes [provider]  citalopram (CELEXA) 20 MG tablet TAKE 1 TABLET BY MOUTH EVERY DAY Patient taking differently: Take 20 mg by mouth daily.  05/24/19  Yes Shelda Pal, DO  Homeopathic Products (ARNICA EX) Apply 1 application topically 2 (two) times daily as needed (foot pain).   Yes [provider]  Homeopathic Products (ZICAM ALLERGY RELIEF NA) Place 1 application into the nose 2 (two) times daily as needed (congestion/allergies).    Yes [provider]  IBUPROFEN-ACETAMINOPHEN PO Take 2 tablets by mouth 2 (two) times daily as needed (pain/headache). OTC combination sold as "ADVIL"   Yes [provider]    losartan (COZAAR) 25 MG tablet Take 2 tablets (50 mg total) by mouth daily. 06/20/19  Yes Shelda Pal, DO  melatonin 5 MG TABS Take 10 mg by mouth at bedtime.   Yes [provider]  Menthol, Topical Analgesic, (BIOFREEZE EX) Apply 1 application topically at bedtime as needed (pain).   Yes [provider]  omeprazole (PRILOSEC) 20 MG capsule TAKE 1 CAPSULE (20 MG TOTAL) BY MOUTH 2 (TWO) TIMES DAILY BEFORE A MEAL. Patient taking differently: Take 20 mg by mouth 3 (three) times daily before meals.  02/28/19  Yes Armbruster, [name], MD  Simethicone (GAS-X PO) Take 1 tablet by mouth 2 (two) times daily as needed (gas/bloating).   Yes [provider]  TRAZODONE HCL PO Take 1 tablet by mouth once.   Yes [provider]  Cholecalciferol (VITAMIN D) 50 MCG (2000 UT) tablet Take 2,000 Units by mouth daily.    [provider]  Polyethyl Glycol-Propyl Glycol (SYSTANE) 0.4-0.3 % SOLN Place 1 drop into both eyes daily as needed (dry eyes).    [provider]    Physical Exam: Vitals:   06/27/19 0758 06/27/19 0956 06/27/19 0957 06/27/19 1000  BP:  (!) 143/111 (!) 138/103 (!) 144/103  Pulse: 96 73 71 71  Resp: 15  20 (!) 22  Temp: 98.3 F (36.8 C)  98.5 F (36.9 C)   TempSrc: Oral     SpO2: 97% 94% 98% 95%  Weight: 91.6 kg     Height: 5\' 6"  (1.676 m)       Vitals:   06/27/19 0758 06/27/19 0956 06/27/19 0957 06/27/19 1000  BP:  (!) 143/111 (!) 138/103 (!) 144/103  Pulse: 96 73 71 71  Resp: 15  20 (!) 22  Temp: 98.3 F (36.8 C)  98.5 F (36.9 C)   TempSrc: Oral     SpO2: 97% 94% 98% 95%  Weight: 91.6 kg     Height: 5\' 6"  (1.676 m)        Constitutional: NAD, calm, slight discomfort from epigastric pain Eyes: EOMI, anicteric sclera, lids and conjunctivae normal ENMT: Mucous membranes are moist. Posterior pharynx clear of any exudate or lesions.Normal dentition.  Neck: normal, supple, no masses, no thyromegaly Respiratory:  clear to auscultation bilaterally, no wheezing, no crackles. Normal respiratory effort. No accessory muscle use.  Cardiovascular: Regular rate and rhythm, no murmurs / rubs / gallops. No extremity edema. 2+ pedal pulses. No carotid bruits.  Abdomen: + epigastric tenderness, obese, no masses palpated. No hepatosplenomegaly. Bowel sounds positive.  Musculoskeletal: no clubbing / cyanosis. No joint deformity upper and lower extremities. Good ROM, no contractures. Normal muscle tone.  Skin: no rashes, lesions, ulcers.  No induration Neurologic: CN 2-12 grossly intact. Sensation and sensation grossly intact Psychiatric: Normal judgment and insight. Normal mood   Labs on Admission: I have personally reviewed following labs and imaging studies  CBC: No results for input(s): WBC, NEUTROABS, HGB, HCT, MCV, PLT in the last 168 hours. Basic Metabolic Panel: No results for input(s): NA, K, CL, CO2, GLUCOSE, BUN, CREATININE, CALCIUM, MG, PHOS in the last 168 hours. GFR: Estimated Creatinine Clearance: 54.2 mL/min (A) (by C-G formula based on SCr of 1.18 mg/dL (H)). Liver Function Tests: No results for input(s): AST, ALT, ALKPHOS, BILITOT, PROT, ALBUMIN in the last 168 hours. No results for input(s): LIPASE, AMYLASE in the last 168 hours. No results for input(s): AMMONIA in the last 168 hours. Coagulation Profile: No results for input(s): INR, PROTIME in the last 168 hours. Cardiac Enzymes: No results for input(s): CKTOTAL, CKMB, CKMBINDEX, TROPONINI in the last 168 hours. BNP (last 3 results) No results for input(s): PROBNP in the last 8760 hours. HbA1C: No results for input(s): HGBA1C in the last 72 hours. CBG: No results for input(s): GLUCAP in the last 168 hours. Lipid Profile: No results for input(s): CHOL, HDL, LDLCALC, TRIG, CHOLHDL, LDLDIRECT in the last 72 hours. Thyroid Function Tests: No results for input(s): TSH, T4TOTAL, FREET4, T3FREE, THYROIDAB in the last 72 hours. Anemia  Panel: No results for input(s): VITAMINB12, FOLATE, FERRITIN, TIBC, IRON, RETICCTPCT in the last 72 hours. Urine analysis:    Component Value Date/Time   COLORURINE YELLOW 06/16/2019 1057   APPEARANCEUR CLEAR 06/16/2019 1057   LABSPEC 1.010 06/16/2019 1057   PHURINE 6.0 06/16/2019 1057   GLUCOSEU >=500 (A) 06/16/2019 1057   HGBUR NEGATIVE 06/16/2019 1057   BILIRUBINUR NEGATIVE 06/16/2019 1057   KETONESUR NEGATIVE 06/16/2019 1057   PROTEINUR NEGATIVE 06/16/2019 1057   NITRITE NEGATIVE 06/16/2019 1057   LEUKOCYTESUR NEGATIVE 06/16/2019 1057    Radiological Exams on Admission: No results found.  Assessment/Plan Karen Hall is a 64 y.o. female with medical history significant of HTN, T2DM, Obresity, Depression, Grade 1 Diastolic dysfunction and severe GERD presenting with and now s/p Transoral incisional funoplication with Gastroenterology.   # Severe GERD now s/p Transoral Incisional Fundoplication - s/p EGD and TIF with Dr. Bryan Lemma, post-op management per GI (pain control, reflux meds,diet) - reglan and pantoprazole ordered - IV decadron 8 mg q6H  # T2DM - Hemoglobin A1c 6.9 on 06/16/19 - can resume metformin at d/c  # HTN - held losartan for now  # HLD - held statin for now  # Depression - can resume citalopram on d/c (tomorrow)  # Obesity - BMI 32.59 - encourage weight loss, diet/exercise - longitudinal follow-up with PCP - complicates all aspects of care  DVT prophylaxis: SQH Code Status: Full Code Consults called: Gastroenterology, Dr. Bryan Lemma Admission status: Observation   Truddie Hidden MD [facility] Pager [phone]  If 7PM-7AM, please contact night-coverage [email or web address] Password [facility]  06/27/2019, 10:03 AM

## 2019-06-27 NOTE — Anesthesia Procedure Notes (Signed)
Procedure Name: Intubation Date/Time: 06/27/2019 8:37 AM Performed by: Glory Buff, CRNA Pre-anesthesia Checklist: Patient identified, Emergency Drugs available, Suction available and Patient being monitored Patient Re-evaluated:Patient Re-evaluated prior to induction Oxygen Delivery Method: Circle system utilized Preoxygenation: Pre-oxygenation with 100% oxygen Induction Type: IV induction, Rapid sequence and Cricoid Pressure applied Laryngoscope Size: Miller and 3 Grade View: Grade I Tube type: Oral Tube size: 7.0 mm Number of attempts: 1 Airway Equipment and Method: Stylet and Oral airway Placement Confirmation: ETT inserted through vocal cords under direct vision,  positive ETCO2 and breath sounds checked- equal and bilateral Secured at: 20 cm Tube secured with: Tape Dental Injury: Teeth and Oropharynx as per pre-operative assessment

## 2019-06-27 NOTE — Op Note (Signed)
[facility] Patient Name: Karen Hall Procedure Date: 06/27/2019 MRN: [id number] Attending MD: Gerrit Heck , MD Date of Birth: 28-Jan-1956 CSN: [id number] Age: 64 Admit Type: Outpatient Procedure:                Upper GI endoscopy Indications:              For therapy of esophageal reflux, For therapy of                            hiatal hernia                           64 yo female with long standing history of GERD and                            small hiatal hernia. Reflux suboptimally controlled                            despite BID PPI therapy, with increasing chronic                            cough and LPR 2/2 reflux. She presents today for                            Transoral Incisionless Fundoplication (TIF). Providers:                Gerrit Heck, MD, Jobe Igo, RN, William Dalton, Technician, Theodora Blow, Technician Referring MD:              Medicines:                General Anesthesia Complications:            No immediate complications. Estimated Blood Loss:     Estimated blood loss was minimal. Procedure:                Pre-Anesthesia Assessment:                           - Prior to the procedure, a History and Physical                            was performed, and patient medications and                            allergies were reviewed. The patient's tolerance of                            previous anesthesia was also reviewed. The risks                            and benefits of the procedure and the sedation  options and risks were discussed with the patient.                            All questions were answered, and informed consent                            was obtained. Prior Anticoagulants: The patient has                            taken no previous anticoagulant or antiplatelet                            agents. ASA Grade Assessment: III - A patient with           severe systemic disease. After reviewing the risks                            and benefits, the patient was deemed in                            satisfactory condition to undergo the procedure.                           After obtaining informed consent, the endoscope was                            passed under direct vision. Throughout the                            procedure, the patient's blood pressure, pulse, and                            oxygen saturations were monitored continuously. The                            [id number]-[id number] Olympus gastroscope was                            introduced through the mouth, and advanced to the                            second part of duodenum. The upper GI endoscopy was                            accomplished without difficulty. The patient                            tolerated the procedure well. Scope In: Scope Out: Findings:      The upper third of the esophagus, middle third of the esophagus and       lower third of the esophagus were normal. The scope was withdrawn.       Dilation was performed with a Maloney dilator with no resistance at 16       Fr. The dilation site was examined following endoscope reinsertion  and       showed no bleeding, mucosal tear or perforation.      The Z-line was mildly irregular (jagged Z line) and was found 40 cm from       the incisors and LES laxity noted on anterograde and retroflexed views.       The decision was made to perform transoral fundoplication with the       EsophyX Z+ system. Before the procedure, the gastroesophageal flap valve       was classified as Hill Grade II (fold present, opens with respiration).       The endoscope was withdrawn, placed through the plication device,       reinserted into the patient and advanced past the level of the GE       junction at 40 cm from the incisors and into the stomach. Next, the       endoscope was advanced beyond the device and retroflexed. The  first       plication site was identified at the 11 o'clock position. With the       device in the proper position, the helical retractor was deployed and       tissue was pulled into the mold before it was closed. The device was       rotated, suction was applied using the invaginator, then the device was       advanced slightly and two H-shaped fasteners were placed. The device was       reloaded and the process repeated in order to deploy a total of ten       fasteners at the first site. The device was then rotated to the 1       o'clock position after which the helical retractor was used to grasp       additional tissue within the mold before rotation and deployment of a       total of six fasteners at the second site. To complete reconstruction of       the valve, additional fasteners were deployed at the following sites:       eight fasteners at 5 o'clock and four fasteners at 7 o'clock positions.       In total, 28 fasteners contributed to create a valve measuring 3 cm in       length which involved 270 degrees of the circumference upon retroflexed       view. The EsophyX device and endoscope were then removed. Relook       endoscopy was performed prior to the conclusion of the case to confirm       the above findings. Estimated blood loss was minimal.      The gastric fundus, gastric body, gastric antrum and pylorus were normal.      The duodenal bulb, first portion of the duodenum and second portion of       the duodenum were normal. Impression:               - Normal upper third of esophagus, middle third of                            esophagus and lower third of esophagus. Dilated.                           - Z-line jagged, 40 cm from the incisors.                           -  Normal gastric fundus, gastric body, antrum and                            pylorus.                           - Normal duodenal bulb, first portion of the                            duodenum and second  portion of the duodenum.                           - EsophyX transoral fundoplication was performed.                           - No specimens collected. Moderate Sedation:      Not Applicable - Patient had care per Anesthesia. Recommendation:           -Admit to surgical ward for overnight observation                            with anticipated discharge tomorrow. Will admit to                            Hospitalist service for assistance in management of                            chronic co-morbidities.                           -Zofran 4 mg IV every 6 hours x24 hours, then prn                           -Reglan 10 mg every 6 hours x24 hours, then prn                           -Resume scopolamine patch x3 days (applied preop)                           -Protonix 40 mg p.o. BID x2 weeks, then 40 mg daily                            x2 weeks, then 20 mg daily x1 week then prn                           -Decadron 8 mg every 6 hours times max 5 doses                           -Gas-X (simethicone) 4225 mg p.o. prn every 6 hours                            gas pain, abdominal discomfort                           -  Tylenol 3 (APAP 120 mg/codeine 12 mg per 5 mL): 15                            mL's every 4 hours prn pain                           -Colace 100 mg p.o. twice daily if taking pain                            medications                           -Clear liquid diet okay overnight                           -Aggressive treatment if any breakthrough of                            chronic cough so not to disrupt newly placed                            surgical fasteners.                           -Okay to ambulate with assist around the ward                           -Please do not hesitate to contact me directly with                            any postoperative questions or concerns                           - Sincerely appreciate assistance from Hospitalist                            service  in the overnight observation and treatment                            of this patient. Procedure Code(s):        --- Professional ---                           [phone], Esophagogastroduodenoscopy, flexible,                            transoral; with esophagogastric fundoplasty,                            partial or complete, includes duodenoscopy when                            performed                           [id number], Dilation of esophagus, by unguided sound or  bougie, single or multiple passes Diagnosis Code(s):        --- Professional ---                           K21.9, Gastro-esophageal reflux disease without                            esophagitis                           K44.9, Diaphragmatic hernia without obstruction or                            gangrene CPT copyright 2019 American Medical Association. All rights reserved. The codes documented in this report are preliminary and upon coder review may  be revised to meet current compliance requirements. Gerrit Heck, MD 06/27/2019 10:01:22 AM Number of Addenda: 0

## 2019-06-27 NOTE — H&P (Signed)
P  Chief Complaint:    GERD, chronic cough, plan for endoscopic antireflux surgery  GI History: 64 yo female referred to me by Dr. Havery Moros for evaluation of possible antireflux intervention with Transoral Incisionless Fundoplication (TIF) with a goal to stop or significantly reduce acid suppression therapy.  She has a longstanding history of reflux symptoms for >20 years, having treated with multiple antacids and acid suppression agents over the years  Evaluated by Pulmonary Clinic for chronic cough in 12/2015 diagnosed as 2/2 chronic reflux with perhaps some seasonal allergies contributing to reactive airway disease. Prescribed Singulair and Symbicort along with increased acid suppression and referral back to GI at that time.  GERD history: -Index symptoms: Belching, sour brash, regurgitation, chronic nonproductive cough.  No dysphagia or odynophagia -Current medications: Protonix 20 mg bid -Complications: Hiatal hernia, chronic cough -Exacerbated by pork, spicy foods, tomato based sauces -Interventions: Avoid eating within 3 hours of bedtime, +HOB elevation, caffeine elimination  GERD-HRQL Questionnaire: 21/50 (on high-dose therapy)  GERD evaluation: -EGD (02/15/2018, Dr. Havery Moros): 1 cm hiatal hernia with otherwise normal esophagus, normal stomach and duodenum.  -Barium Esophagram (07/2015): 13 mm barium tablet passes easily. Full column gastroesophageal reflux observed.  HPI:     Patient is a 64 y.o. female presenting today for planned TIF for treatment of medically refractory reflux with LPR.  Her reflux induced cough has been worsening lately, to include recent hospital admission for bronchospasm thought secondary to reflux.  This despite ongoing high-dose PPI therapy.  Still with intermittent breakthrough regurgitation and sour brash, intermittent belching, and raspy voice.  Was seen in follow-up in the Pulmonary clinic on 01/23/2019, with recommendation to proceed  with TIF sooner rather than later for treatment of GERD with extra esophageal manifestations.  Was seen by ENT at [facility] on 01/22/2019.  Indirect laryngoscopy shows no concerning lesions and vocal cords move well on phonation.  No additional interventions/medications at that time.  Recent hospital admission 4/3-4 with SOB and cough which was felt secondary to severe reflux.  BP suboptimally controlled, so Imdur was added.  A1c 6.9, and previously was 7.1, so was formally diagnosed with diabetes and started on Metformin.  Mild AKI responsive to IV fluids.  Incidentally noted grade 1 diastolic dysfunction on echocardiogram, likely due to poorly controlled BP, with otherwise normal BNP.   Review of systems:     No chest pain, no SOB, no fevers, no urinary sx   Past Medical History:  Diagnosis Date  . Allergy   . ANXIETY 10/18/2007   Qualifier: Diagnosis of  By: Sarajane Jews MD, Ishmael Holter   . Autoimmune hepatitis ([place])   . Cataract    "beginnings of"  . Chronic low back pain   . Diabetes ([place]) 02/24/2016  . GERD 10/18/2007   Qualifier: Diagnosis of  By: Sherlynn Stalls, CMA, [place]    . Hyperlipemia 03/12/2014  . HYPERTENSION 10/18/2007   Qualifier: Diagnosis of  By: Sherlynn Stalls, CMA, [place]    . Insomnia 03/12/2014  . Irritable bowel syndrome 10/18/2007   Qualifier: Diagnosis of  By: Sarajane Jews MD, Ishmael Holter   . Osteopenia   . Recurrent knee pain    R knee hx meniscal tear 2002 with repair    Patient's surgical history, family medical history, social history, medications and allergies were all reviewed in Epic    Current Facility-Administered Medications  Medication Dose Route Frequency Provider Last Rate Last Admin  . 0.9 %  sodium chloride infusion   Intravenous Continuous Kaveon Blatz  V, DO      . ceFAZolin (ANCEF) IVPB 2g/100 mL premix  2 g Intravenous Once Myeisha Kruser V, DO      . famotidine (PEPCID) IVPB 20 mg premix  20 mg Intravenous Once Dorthey Depace V, DO      . lactated ringers infusion    Intravenous Continuous Treyvin Glidden V, DO      . ondansetron (ZOFRAN) injection 4 mg  4 mg Intravenous Once Salome Cozby V, DO      . scopolamine (TRANSDERM-SCOP) 1 MG/3DAYS 1.5 mg  1 patch Transdermal Once Sir Mallis V, DO        Physical Exam:     Ht 5\' 6"  (1.676 m)   Wt 91.6 kg   BMI 32.60 kg/m   GENERAL:  Pleasant female in NAD PSYCH: : Cooperative, normal affect EENT:  conjunctiva pink, mucous membranes moist, neck supple without masses CARDIAC:  RRR, no murmur heard, no peripheral edema PULM: Normal respiratory effort, lungs CTA bilaterally, no wheezing ABDOMEN:  Nondistended, soft, nontender. No obvious masses, no hepatomegaly,  normal bowel sounds SKIN:  turgor, no lesions seen Musculoskeletal:  Normal muscle tone, normal strength NEURO: Alert and oriented x 3, no focal neurologic deficits   IMPRESSION and PLAN:    1) GERD 2) Chronic cough 3) Raspy voice 4) Hiatal hernia  64 year old female with longstanding history of GERD which is suboptimally controlled despite high-dose PPI therapy.  Does have clear clinical evidence of LPR and extra esophageal manifestations of reflux, with most bothersome feature being her chronic cough.  Has been evaluated in the pulmonary and ENT clinics.  Plan for Transoral Incisionless Fundoplication (TIF) today as a means to better control her reflux, stop or significantly reduce need for chronic PPI therapy, aid in treatment of chronic cough, and repair of hiatal hernia.  -EGD with TIF today -Plan for admission to the Hospitalist service for overnight observation with anticipated discharge tomorrow morning (23-hour stay) -Additional recommendations for postoperative care pending completion of EGD with TIF  The indications, risks, and benefits of EGD with TIF were explained to the patient in detail. Risks include but are not limited to bleeding, perforation, adverse reaction to medications, and cardiopulmonary compromise. Sequelae  include but are not limited to the possibility of surgery, prolonged hositalization, and mortality. The patient verbalized understanding and wished to proceed.            Lavena Bullion ,DO, FACG 06/27/2019, 7:52 AM

## 2019-06-28 ENCOUNTER — Encounter: Payer: Self-pay | Admitting: *Deleted

## 2019-06-28 DIAGNOSIS — R05 Cough: Secondary | ICD-10-CM | POA: Diagnosis not present

## 2019-06-28 DIAGNOSIS — K2100 Gastro-esophageal reflux disease with esophagitis, without bleeding: Secondary | ICD-10-CM | POA: Diagnosis not present

## 2019-06-28 DIAGNOSIS — K449 Diaphragmatic hernia without obstruction or gangrene: Secondary | ICD-10-CM

## 2019-06-28 DIAGNOSIS — K219 Gastro-esophageal reflux disease without esophagitis: Secondary | ICD-10-CM | POA: Diagnosis not present

## 2019-06-28 DIAGNOSIS — M8580 Other specified disorders of bone density and structure, unspecified site: Secondary | ICD-10-CM | POA: Diagnosis not present

## 2019-06-28 DIAGNOSIS — Z888 Allergy status to other drugs, medicaments and biological substances status: Secondary | ICD-10-CM | POA: Diagnosis not present

## 2019-06-28 DIAGNOSIS — Z809 Family history of malignant neoplasm, unspecified: Secondary | ICD-10-CM | POA: Diagnosis not present

## 2019-06-28 DIAGNOSIS — E119 Type 2 diabetes mellitus without complications: Secondary | ICD-10-CM | POA: Diagnosis not present

## 2019-06-28 DIAGNOSIS — Z9841 Cataract extraction status, right eye: Secondary | ICD-10-CM | POA: Diagnosis not present

## 2019-06-28 DIAGNOSIS — Z791 Long term (current) use of non-steroidal anti-inflammatories (NSAID): Secondary | ICD-10-CM | POA: Diagnosis not present

## 2019-06-28 DIAGNOSIS — G8929 Other chronic pain: Secondary | ICD-10-CM | POA: Diagnosis not present

## 2019-06-28 DIAGNOSIS — Z808 Family history of malignant neoplasm of other organs or systems: Secondary | ICD-10-CM | POA: Diagnosis not present

## 2019-06-28 DIAGNOSIS — Z800 Family history of malignant neoplasm of digestive organs: Secondary | ICD-10-CM | POA: Diagnosis not present

## 2019-06-28 DIAGNOSIS — M545 Low back pain: Secondary | ICD-10-CM | POA: Diagnosis not present

## 2019-06-28 DIAGNOSIS — Z9049 Acquired absence of other specified parts of digestive tract: Secondary | ICD-10-CM | POA: Diagnosis not present

## 2019-06-28 DIAGNOSIS — E785 Hyperlipidemia, unspecified: Secondary | ICD-10-CM | POA: Diagnosis not present

## 2019-06-28 DIAGNOSIS — Z885 Allergy status to narcotic agent status: Secondary | ICD-10-CM | POA: Diagnosis not present

## 2019-06-28 DIAGNOSIS — Z9842 Cataract extraction status, left eye: Secondary | ICD-10-CM | POA: Diagnosis not present

## 2019-06-28 DIAGNOSIS — Z803 Family history of malignant neoplasm of breast: Secondary | ICD-10-CM | POA: Diagnosis not present

## 2019-06-28 DIAGNOSIS — Z79899 Other long term (current) drug therapy: Secondary | ICD-10-CM | POA: Diagnosis not present

## 2019-06-28 DIAGNOSIS — I10 Essential (primary) hypertension: Secondary | ICD-10-CM | POA: Diagnosis not present

## 2019-06-28 DIAGNOSIS — Z833 Family history of diabetes mellitus: Secondary | ICD-10-CM | POA: Diagnosis not present

## 2019-06-28 MED ORDER — SIMETHICONE 80 MG PO CHEW: 80.0000 mg | CHEWABLE_TABLET | Freq: Four times a day (QID) | ORAL | 0 refills | Status: DC | PRN

## 2019-06-28 MED ORDER — OMEPRAZOLE 20 MG PO CPDR: DELAYED_RELEASE_CAPSULE | ORAL | 1 refills | Status: DC

## 2019-06-28 MED ORDER — METOCLOPRAMIDE HCL 10 MG PO TABS: 10.0000 mg | ORAL_TABLET | Freq: Four times a day (QID) | ORAL | 0 refills | Status: DC | PRN

## 2019-06-28 MED ORDER — ACETAMINOPHEN 500 MG PO TABS: 500.0000 mg | ORAL_TABLET | Freq: Four times a day (QID) | ORAL | 0 refills | Status: AC | PRN

## 2019-06-28 MED ORDER — ONDANSETRON HCL 4 MG PO TABS: 4.0000 mg | ORAL_TABLET | Freq: Four times a day (QID) | ORAL | 1 refills | Status: AC | PRN

## 2019-06-28 NOTE — Discharge Summary (Signed)
Physician Discharge Summary  Karen Hall [id number] DOB: [birth year] DOA: 06/27/2019  PCP: Karen Pal, DO  Admit date: 06/27/2019 Discharge date: 06/28/2019  Admitted From: Home  Discharge disposition: Home  Recommendations for Outpatient Follow-Up:   . Follow up with your primary care provider in one week.  . Follow-up with Dr. Bryan Hall, GI as scheduled by the GI clinic.  Discharge Diagnosis:   Active Problems:   Chronic cough   Hiatal hernia with GERD   Discharge Condition: Improved.  Diet recommendation: As per GI recommendation.  Wound care: None.  Code status: Full.   History of Present Illness:   Karen Hall is a 64 y.o. female with medical history significant of HTN, T2DM, Obresity, Depression, Grade 1 Diastolic dysfunction and severe GERD underwent s/p Transoral incisional funoplication with Gastroenterology and was admitted to the hospital for observation. Patient had a history of severe GERD, was taking oral PPI 3-4 x a day with meals, had ongoing symptoms for multiple decades without relief.  She additionally had chronic cough and it was though this was contributing.   Hospital Course:   Following conditions were addressed during hospitalization as listed below,  Severe GERD now s/p Transoral Incisional Fundoplication - s/p EGD and TIF with Dr. Bryan Hall.  Post surgical instructions will be given on discharge.  T2DM - Hemoglobin A1c 6.9 on 06/16/19. will resume metformin at d/c  Essential HTN Resume losartan on discharge  Hyperlipidemia.  Continue statins on discharge  Depression Resume citalopram.  Obesity .  Lifestyle modification as outpatient.   Disposition.  At this time, patient is stable for disposition home.  Medical Consultants:   GI Procedures:    EsophyX transoral fundoplication on [id number] Subjective:   Today, patient feels okay.  Denies any nausea, vomiting or abdominal pain.  Discharge Exam:    Vitals:   06/28/19 0212 06/28/19 0444  BP: 131/90 (!) 150/85  Pulse: (!) 54 (!) 51  Resp: 16 18  Temp: 98.3 F (36.8 C) 98.4 F (36.9 C)  SpO2: (!) 89% 90%   Vitals:   06/27/19 1301 06/27/19 2105 06/28/19 0212 06/28/19 0444  BP: (!) 150/85 133/86 131/90 (!) 150/85  Pulse: 61 60 (!) 54 (!) 51  Resp: 16 17 16 18   Temp: 98.2 F (36.8 C) 98.2 F (36.8 C) 98.3 F (36.8 C) 98.4 F (36.9 C)  TempSrc: Oral Oral Oral Oral  SpO2: 100% 92% (!) 89% 90%  Weight:      Height:       General: Alert awake, not in obvious distress HENT: pupils equally reacting to light,  No scleral pallor or icterus noted. Oral mucosa is moist.  Chest:  Clear breath sounds.  Diminished breath sounds bilaterally. No crackles or wheezes.  CVS: S1 &S2 heard. No murmur.  Regular rate and rhythm. Abdomen: Soft, nontender, nondistended.  Bowel sounds are heard.   Extremities: No cyanosis, clubbing or edema.  Peripheral pulses are palpable. Psych: Alert, awake and oriented, normal mood CNS:  No cranial nerve deficits.  Power equal in all extremities.   Skin: Warm and dry.  No rashes noted.  The results of significant diagnostics from this hospitalization (including imaging, microbiology, ancillary and laboratory) are listed below for reference.     Diagnostic Studies:   No results found.   Labs:   Basic Metabolic Panel: No results for input(s): NA, K, CL, CO2, GLUCOSE, BUN, CREATININE, CALCIUM, MG, PHOS in the last 168 hours. GFR Estimated Creatinine Clearance: 54.2 mL/min (A) (by  C-G formula based on SCr of 1.18 mg/dL (H)). Liver Function Tests: No results for input(s): AST, ALT, ALKPHOS, BILITOT, PROT, ALBUMIN in the last 168 hours. No results for input(s): LIPASE, AMYLASE in the last 168 hours. No results for input(s): AMMONIA in the last 168 hours. Coagulation profile No results for input(s): INR, PROTIME in the last 168 hours.  CBC: No results for input(s): WBC, NEUTROABS, HGB, HCT, MCV, PLT  in the last 168 hours. Cardiac Enzymes: No results for input(s): CKTOTAL, CKMB, CKMBINDEX, TROPONINI in the last 168 hours. BNP: Invalid input(s): POCBNP CBG: Recent Labs  Lab 06/27/19 1008  GLUCAP 169*   D-Dimer No results for input(s): DDIMER in the last 72 hours. Hgb A1c No results for input(s): HGBA1C in the last 72 hours. Lipid Profile No results for input(s): CHOL, HDL, LDLCALC, TRIG, CHOLHDL, LDLDIRECT in the last 72 hours. Thyroid function studies No results for input(s): TSH, T4TOTAL, T3FREE, THYROIDAB in the last 72 hours.  Invalid input(s): FREET3 Anemia work up No results for input(s): VITAMINB12, FOLATE, FERRITIN, TIBC, IRON, RETICCTPCT in the last 72 hours. Microbiology Recent Results (from the past 240 hour(s))  SARS CORONAVIRUS 2 (TAT 6-24 HRS) Nasopharyngeal Nasopharyngeal Swab     Status: None   Collection Time: 06/25/19 10:34 AM   Specimen: Nasopharyngeal Swab  Result Value Ref Range Status   SARS Coronavirus 2 NEGATIVE NEGATIVE Final    Comment: (NOTE) SARS-CoV-2 target nucleic acids are NOT DETECTED. The SARS-CoV-2 RNA is generally detectable in upper and lower respiratory specimens during the acute phase of infection. Negative results do not preclude SARS-CoV-2 infection, do not rule out co-infections with other pathogens, and should not be used as the sole basis for treatment or other patient management decisions. Negative results must be combined with clinical observations, patient history, and epidemiological information. The expected result is Negative. Fact Sheet for Patients: SugarRoll.be Fact Sheet for Healthcare Providers: https://www.woods-mathews.com/ This test is not yet approved or cleared by the [place] FDA and  has been authorized for detection and/or diagnosis of SARS-CoV-2 by FDA under an Emergency Use Authorization (EUA). This EUA will remain  in effect (meaning this test can be used)  for the duration of the COVID-19 declaration under Section 56 4(b)(1) of the Act, 21 U.S.C. section 360bbb-3(b)(1), unless the authorization is terminated or revoked sooner. Performed at [facility], [place] [address], [place], [place] [address]      Discharge Instructions:   Discharge Instructions    Discharge instructions   Complete by: As directed    2 weeks of liquid soft foods followed by 4 weeks slowly progressive diet back to regular  -Previously provided with handout for post operative diet plan   Post Op Activity:  - Week 1: encourage short distance walking, minimal physical activity, no lifting >5 lbs  - Week 2: Slow climbing stairs, no intense exercise, no lifting >5 lbs  - Week 3-6: No intense exercise, may lift up to 25 lbs  - Week 7: Resume normal activity     Allergies as of 06/28/2019      Reactions   Aspartame And Phenylalanine Other (See Comments)   Headache   Codeine Nausea Only      Medication List    STOP taking these medications   IBUPROFEN-ACETAMINOPHEN PO     TAKE these medications   acetaminophen 500 MG tablet Commonly known as: TYLENOL Take 1 tablet (500 mg total) by mouth every 6 (six) hours as needed for up to  10 days for mild pain.   atorvastatin 10 MG tablet Commonly known as: LIPITOR TAKE 1 TABLET BY MOUTH EVERY DAY   BIOFREEZE EX Apply 1 application topically at bedtime as needed (pain).   Calcium Citrate + D3 200-250 MG-UNIT Tabs Generic drug: Calcium Citrate-Vitamin D Take 1 tablet by mouth in the morning and at bedtime.   citalopram 20 MG tablet Commonly known as: CELEXA TAKE 1 TABLET BY MOUTH EVERY DAY   losartan 25 MG tablet Commonly known as: Cozaar Take 2 tablets (50 mg total) by mouth daily.   melatonin 5 MG Tabs Take 10 mg by mouth at bedtime.   metoCLOPramide 10 MG tablet Commonly known as: REGLAN Take 1 tablet (10 mg total) by mouth every 6 (six) hours as needed for up to 15 days for vomiting or  refractory nausea / vomiting.   omeprazole 20 MG capsule Commonly known as: PRILOSEC Take 2 capsules (40 mg total) by mouth 2 (two) times daily before a meal for 14 days, THEN 1 capsule (20 mg total) daily for 7 days, THEN 1 capsule (20 mg total) daily as needed for up to 7 days (acid reflux). Start taking on: June 28, 2019 What changed: See the new instructions.   ondansetron 4 MG tablet Commonly known as: Zofran Take 1 tablet (4 mg total) by mouth every 6 (six) hours as needed for up to 15 days for nausea or vomiting.   simethicone 80 MG chewable tablet Commonly known as: Gas-X Chew 1 tablet (80 mg total) by mouth 4 (four) times daily as needed (gas/bloating). What changed:   medication strength  how much to take  when to take this   Systane 0.4-0.3 % Soln Generic drug: Polyethyl Glycol-Propyl Glycol Place 1 drop into both eyes daily as needed (dry eyes).   TRAZODONE HCL PO Take 1 tablet by mouth once.   Vitamin D 50 MCG (2000 UT) tablet Take 2,000 Units by mouth daily.   ZICAM ALLERGY RELIEF NA Place 1 application into the nose 2 (two) times daily as needed (congestion/allergies).   ARNICA EX Apply 1 application topically 2 (two) times daily as needed (foot pain).      Follow-up Information    Cirigliano, Vito V, DO Follow up.   Specialty: Gastroenterology Why: office to call for appointment Contact information: [address] [place] [address] [phone]            Time coordinating discharge: 39 minutes  Signed:  Joan Herschberger  [facility] 06/28/2019, 10:23 AM

## 2019-06-28 NOTE — Progress Notes (Signed)
Assessment unchanged. Pt verbalized understanding of instructions given through teach back including med & activity to resume, follow up care and when to the doctor. Discharged via wc to front entrance accompanied by NT.

## 2019-06-28 NOTE — Progress Notes (Signed)
 GASTROENTEROLOGY ROUNDING NOTE   Subjective: TIF completed yesterday without complications.  Patient did well overnight without any acute events.  Mild, expected epigastric pain postoperatively which is essentially resolved.  Minimal cough overnight.  Tolerating liquids and p.o. medications without issue.  Eager for discharge home later today.  Objective: Vital signs in last 24 hours: Temp:  [98.2 F (36.8 C)-98.8 F (37.1 C)] 98.4 F (36.9 C) (04/15 0444) Pulse Rate:  [51-96] 51 (04/15 0444) Resp:  [15-24] 18 (04/15 0444) BP: (106-150)/(83-111) 150/85 (04/15 0444) SpO2:  [89 %-100 %] 90 % (04/15 0444) Weight:  [91.6 kg] 91.6 kg (04/14 0758)   General: NAD Lungs: CTA b/l, no w/r/r Heart: RRR, no m/r/g Abdomen: Soft, nontender, nondistended.  No rebound or guarding. No peritoneal signs. +BS Ext: No c/c/e    Intake/Output from previous day: 04/14 0701 - 04/15 0700 In: 2206 [P.O.:240; I.V.:1616; IV Piggyback:350] Out: -  Intake/Output this shift: No intake/output data recorded.   Lab Results: No results for input(s): WBC, HGB, PLT, MCV in the last 72 hours. BMET No results for input(s): NA, K, CL, CO2, GLUCOSE, BUN, CREATININE, CALCIUM in the last 72 hours. LFT No results for input(s): PROT, ALBUMIN, AST, ALT, ALKPHOS, BILITOT, BILIDIR, IBILI in the last 72 hours. PT/INR No results for input(s): INR in the last 72 hours.    Imaging/Other results: No results found.    Assessment and Plan:   Impression and Recommendations:  Karen Hall is a 64 y.o. female s/p EGD with Transoral Incisionless Fundoplication (TIF) completed yesterday with no events on overnight observation. Ok for d/c to home today with the following plan:   - Prilosec 40 mg PO BID for 2 weeks, then 40 mg daily for 2 weeks, then 20 mg daily for 1 week, then prn  - D/c with Zofran 4 mg PO prn Q6 hours for nausea  - D/c with Reglan 10 mg PO prn Q6 hours for nausea  - D/c with Simethicone  80 mg PO prn Q6 hours for bloating/abdominal discomfort -Tylenol as needed for any postoperative pain.  As she is without pain currently/overnight, not discharging with any escalating pain medications.  Diet:  - 2 weeks of liquid soft foods followed by 4 weeks slowly progressive diet back to regular  -Previously provided with handout for post operative diet plan   Post Op Activity:  - Week 1: encourage short distance walking, minimal physical activity, no lifting >5 lbs  - Week 2: Slow climbing stairs, no intense exercise, no lifting >5 lbs  - Week 3-6: No intense exercise, may lift up to 25 lbs  - Week 7: Resume normal activity   -Okay to discharge today -Will call patient to schedule follow-up appointment with me in the [facility] at [facility] - Sincerely appreciate the assistance by the Hospital service in the admission and overnight observation of this patient.   Lavena Bullion, DO  06/28/2019, 7:33 AM [facility] Pager [phone]

## 2019-06-28 NOTE — Progress Notes (Signed)
Attempted, left message/bt  

## 2019-07-24 ENCOUNTER — Ambulatory Visit: Payer: Medicare Other | Admitting: Gastroenterology

## 2019-07-24 ENCOUNTER — Encounter: Payer: Self-pay | Admitting: Gastroenterology

## 2019-07-24 ENCOUNTER — Other Ambulatory Visit: Payer: Self-pay

## 2019-07-24 VITALS — BP 130/84 | HR 98 | Temp 96.6°F | Ht 66.0 in | Wt 201.4 lb

## 2019-07-24 DIAGNOSIS — R05 Cough: Secondary | ICD-10-CM | POA: Diagnosis not present

## 2019-07-24 DIAGNOSIS — R053 Chronic cough: Secondary | ICD-10-CM

## 2019-07-24 DIAGNOSIS — K219 Gastro-esophageal reflux disease without esophagitis: Secondary | ICD-10-CM | POA: Diagnosis not present

## 2019-07-24 DIAGNOSIS — Z9889 Other specified postprocedural states: Secondary | ICD-10-CM

## 2019-07-24 DIAGNOSIS — K449 Diaphragmatic hernia without obstruction or gangrene: Secondary | ICD-10-CM | POA: Diagnosis not present

## 2019-07-24 NOTE — Patient Instructions (Signed)
If you are age 66 or older, your body mass index should be between 23-30. Your Body mass index is 32.5 kg/m. If this is out of the aforementioned range listed, please consider follow up with your Primary Care Provider.  If you are age 68 or younger, your body mass index should be between 19-25. Your Body mass index is 32.5 kg/m. If this is out of the aformentioned range listed, please consider follow up with your Primary Care Provider.   Follow up in 3 months.   It was a pleasure to see you today!  Vito Cirigliano, D.O.

## 2019-07-24 NOTE — Progress Notes (Signed)
P  Chief Complaint:    Postoperative follow-up  GI History: 64 yo femalereferred to me byDr. Lady Deutscher Transoral Incisionless Fundoplication (TIF) with a goal to stop or significantly reduce acid suppression therapy.She has a longstanding history of reflux symptoms for>20years,having treated with multiple antacids and acid suppression agents over the years  Evaluated by Pulmonary Clinic for chronic cough in 12/2015 diagnosed as 2/2 chronic reflux with perhaps some seasonal allergies contributing to reactive airway disease. Prescribed Singulair and Symbicort along with increased acid suppression and referral back to GI at that time.  01/22/2019: ENT at [facility]: Indirect laryngoscopy without concerning lesions  Completed TIF [id number] without complications.  GERD history: -Index symptoms:Belching, sour brash, regurgitation, chronic nonproductive cough. No dysphagia or odynophagia -Current medications:Protonix 20 mgbid -Complications:Hiatal hernia, chronic cough -Exacerbated by pork, spicy foods, tomato based sauces -Interventions: Avoid eating within 3 hours of bedtime,+HOBelevation, caffeine elimination  GERD-HRQLQuestionnaire: 21/50 (on high-dose therapy)  GERD evaluation: -EGD (02/15/2018, Dr. Havery Moros): 1 cm hiatal hernia with otherwise normal esophagus, normal stomach and duodenum. -EGD with TIF (06/27/2019, Dr. Bryan Lemma): Successful TIF with 28 fasteners placed  -Barium Esophagram (07/2015): 13 mm barium tablet passes easily. Full column gastroesophageal reflux observed.  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  HPI:     Patient is a 64 y.o. female presenting to the Gastroenterology Clinic for follow-up after EGD with TIF on 06/27/2019.  Since hospital discharge, has been doing well.  Tolerated postoperative diet without issues.  Has actually progressed all the way to solid foods again without issue.  Took Prilosec 40 mg BID x2 weeks, and has since started to weaned off completely earlier this week without any breakthrough symptoms.  Her chronic cough is vastly improved.  She feels much better" so happy and went through this procedure".  Very rare cough now, and more associated with a.m. postnasal drip for environmental exposures.  No postprandial coughing. Sleeping through the night without issue, which is a big improvement for her.  Does have intermittent globus sensation, but no dysphagia or odynophagia.   Review of systems:     No chest pain, no SOB, no fevers, no urinary sx   Past Medical History:  Diagnosis Date  . Allergy   . ANXIETY 10/18/2007   Qualifier: Diagnosis of  By: Sarajane Jews MD, Ishmael Holter   . Autoimmune hepatitis ([place])   . Cataract    "beginnings of"  . Chronic low back pain   . Diabetes ([place]) 02/24/2016  . GERD 10/18/2007   Qualifier: Diagnosis of  By: Sherlynn Stalls, CMA, [place]    . Hyperlipemia 03/12/2014  . HYPERTENSION 10/18/2007   Qualifier: Diagnosis of  By: Sherlynn Stalls, CMA, [place]    . Insomnia 03/12/2014  . Irritable bowel syndrome 10/18/2007   Qualifier: Diagnosis of  By: Sarajane Jews MD, Ishmael Holter   . Osteopenia   . Recurrent knee pain    R knee hx meniscal tear 2002 with repair    Patient's surgical history, family medical history, social history, medications and allergies were all reviewed in Epic    Current Outpatient Medications  Medication Sig Dispense Refill  . atorvastatin (LIPITOR) 10 MG tablet TAKE 1 TABLET BY MOUTH EVERY DAY (Patient taking differently: Take 10 mg by mouth daily. ) 90 tablet 0  . Calcium Citrate-Vitamin D (CALCIUM CITRATE + D3) 200-250 MG-UNIT TABS Take 1 tablet by mouth in the morning and at bedtime.    . Cholecalciferol (VITAMIN D) 50 MCG (2000 UT) tablet Take  2,000 Units by mouth daily.    . citalopram (CELEXA) 20 MG tablet TAKE 1 TABLET BY MOUTH EVERY DAY (Patient taking differently: Take 20 mg by mouth daily. ) 90 tablet 3  . Homeopathic Products  (ZICAM ALLERGY RELIEF NA) Place 1 application into the nose 2 (two) times daily as needed (congestion/allergies).     . losartan (COZAAR) 25 MG tablet Take 2 tablets (50 mg total) by mouth daily.    . melatonin 5 MG TABS Take 10 mg by mouth at bedtime.    . Menthol, Topical Analgesic, (BIOFREEZE EX) Apply 1 application topically at bedtime as needed (pain).    Vladimir Faster Glycol-Propyl Glycol (SYSTANE) 0.4-0.3 % SOLN Place 1 drop into both eyes daily as needed (dry eyes).    . simethicone (GAS-X) 80 MG chewable tablet Chew 1 tablet (80 mg total) by mouth 4 (four) times daily as needed (gas/bloating). 60 tablet 0   No current facility-administered medications for this visit.    Physical Exam:     BP 130/84   Pulse 98   Temp (!) 96.6 F (35.9 C)   Ht 5\' 6"  (1.676 m)   Wt 201 lb 6 oz (91.3 kg)   BMI 32.50 kg/m   GENERAL:  Pleasant female in NAD PSYCH: : Cooperative, normal affect NEURO: Alert and oriented x 3, no focal neurologic deficits   IMPRESSION and PLAN:    1) GERD- now s/p TIF 2) Chronic cough 3) Globus sensation 4) Hiatal hernia-repaired at time of TIF  Reflux symptoms significantly improved since TIF on 06/27/2019.  Has since titrated off all PPI therapy.  Has progressed diet essentially back to previous diet without issue or breakthrough symptoms.  Chronic cough significantly improved and otherwise no heartburn, regurgitation, belching.  -Okay to use on-demand acid suppression therapy/antacids as needed -Continue to progress diet slowly -Continue management of chronic cough -Discussed globus sensation.  Symptoms are minimally bothersome.  This is part of the expected postoperative healing and should wane with time     RTC in 3 months or sooner as needed   I spent 20 minutes of time, including independent review of results as outlined above, communicating results with the patient directly, face-to-face time with the patient, coordinating care, ordering studies and  medications as appropriate, and documentation.       Lavena Bullion ,DO, FACG 07/24/2019, 1:59 PM

## 2019-08-08 ENCOUNTER — Ambulatory Visit: Payer: Medicare Other | Attending: Internal Medicine

## 2019-08-08 DIAGNOSIS — Z23 Encounter for immunization: Secondary | ICD-10-CM

## 2019-08-08 NOTE — Progress Notes (Signed)
   Covid-19 Vaccination Clinic  Name:  Karen Hall    MRN: [id number] DOB: 02-Aug-1955  08/08/2019  [name] was observed post Covid-19 immunization for 15 minutes without incident. She was provided with Vaccine Information Sheet and instruction to access the V-Safe system.   [name] was instructed to call 911 with any severe reactions post vaccine: Marland Kitchen Difficulty breathing  . Swelling of face and throat  . A fast heartbeat  . A bad rash all over body  . Dizziness and weakness   Immunizations Administered    Name Date Dose VIS Date Route   JANSSEN COVID-19 VACCINE 08/08/2019 10:02 AM 0.5 mL 05/12/2019 Intramuscular   Manufacturer: Alphonsa Overall   Lot: [id number]   [place]: [id number]

## 2019-08-19 ENCOUNTER — Other Ambulatory Visit: Payer: Self-pay | Admitting: Family Medicine

## 2019-08-19 DIAGNOSIS — I10 Essential (primary) hypertension: Secondary | ICD-10-CM

## 2019-08-27 ENCOUNTER — Other Ambulatory Visit: Payer: Self-pay | Admitting: Gastroenterology

## 2019-08-30 ENCOUNTER — Other Ambulatory Visit: Payer: Self-pay | Admitting: Family Medicine

## 2019-09-10 ENCOUNTER — Telehealth: Payer: Self-pay | Admitting: Family Medicine

## 2019-09-10 MED ORDER — ATORVASTATIN CALCIUM 10 MG PO TABS: 10.0000 mg | ORAL_TABLET | Freq: Every day | ORAL | 0 refills | Status: DC

## 2019-09-10 NOTE — Telephone Encounter (Signed)
Medication: atorvastatin (LIPITOR) 10 MG tablet      Has the patient contacted their pharmacy? yes (If no, request that the patient contact the pharmacy for the refill.) (If yes, when and what did the pharmacy advise?) Contact doctor office    Preferred Pharmacy (with phone number or street name): [id number] - [place], [place] - [address]  [place], [place] [address]  Phone:  [phone] Fax:  [phone]      Agent: Please be advised that RX refills may take up to 3 business days. We ask that you follow-up with your pharmacy.

## 2019-09-10 NOTE — Telephone Encounter (Signed)
Sent in

## 2019-10-09 DIAGNOSIS — M9901 Segmental and somatic dysfunction of cervical region: Secondary | ICD-10-CM | POA: Diagnosis not present

## 2019-10-09 DIAGNOSIS — M542 Cervicalgia: Secondary | ICD-10-CM | POA: Diagnosis not present

## 2019-10-09 DIAGNOSIS — M5413 Radiculopathy, cervicothoracic region: Secondary | ICD-10-CM | POA: Diagnosis not present

## 2019-10-09 DIAGNOSIS — M9902 Segmental and somatic dysfunction of thoracic region: Secondary | ICD-10-CM | POA: Diagnosis not present

## 2019-10-10 ENCOUNTER — Ambulatory Visit (INDEPENDENT_AMBULATORY_CARE_PROVIDER_SITE_OTHER): Payer: Medicare Other | Admitting: Family Medicine

## 2019-10-10 ENCOUNTER — Encounter: Payer: Self-pay | Admitting: Family Medicine

## 2019-10-10 ENCOUNTER — Other Ambulatory Visit: Payer: Self-pay

## 2019-10-10 DIAGNOSIS — I10 Essential (primary) hypertension: Secondary | ICD-10-CM

## 2019-10-10 MED ORDER — LOSARTAN POTASSIUM 25 MG PO TABS: 25.0000 mg | ORAL_TABLET | Freq: Every day | ORAL | 2 refills | Status: DC

## 2019-10-10 NOTE — Progress Notes (Signed)
Chief Complaint  Patient presents with  . problems with blood pressure    Subjective Karen Hall is a 64 y.o. female who presents for hypertension follow up. She does monitor home blood pressures. Blood pressures ranging from 100-160's/90's on average. She is compliant with medication- losartan 50 mg/d. Patient has these side effects of medication: lightheadedness She is not adhering to a healthy diet overall. Current exercise: none   Past Medical History:  Diagnosis Date  . Allergy   . ANXIETY 10/18/2007   Qualifier: Diagnosis of  By: Sarajane Jews MD, Ishmael Holter   . Autoimmune hepatitis ([place])   . Cataract    "beginnings of"  . Chronic low back pain   . Diabetes ([place]) 02/24/2016  . GERD 10/18/2007   Qualifier: Diagnosis of  By: Sherlynn Stalls, CMA, [place]    . Hyperlipemia 03/12/2014  . HYPERTENSION 10/18/2007   Qualifier: Diagnosis of  By: Sherlynn Stalls, CMA, [place]    . Insomnia 03/12/2014  . Irritable bowel syndrome 10/18/2007   Qualifier: Diagnosis of  By: Sarajane Jews MD, Ishmael Holter   . Osteopenia   . Recurrent knee pain    R knee hx meniscal tear 2002 with repair    Exam BP 102/70 (BP Location: Left Arm, Patient Position: Sitting, Cuff Size: Normal)   Pulse (!) 108   Temp 98.2 F (36.8 C) (Oral)   Ht 5' 6.5" (1.689 m)   Wt 194 lb (88 kg)   SpO2 95%   BMI 30.84 kg/m  General:  well developed, well nourished, in no apparent distress Heart: RRR, no bruits, no LE edema Lungs: clear to auscultation, no accessory muscle use Psych: well oriented with normal range of affect and appropriate judgment/insight   Essential hypertension - Plan: losartan (COZAAR) 25 MG tablet  Decrease to 25 mg/d. Counseled on diet and exercise. Monitor BP at home. Let me know if not at goal.  F/u in 3 mo for CPE or prn. The patient voiced understanding and agreement to the plan.  [place], DO 10/10/19  3:02 PM

## 2019-10-10 NOTE — Patient Instructions (Signed)
Keep the diet clean and stay active.  Aim to do some physical exertion for 150 minutes per week. This is typically divided into 5 days per week, 30 minutes per day. The activity should be enough to get your heart rate up. Anything is better than nothing if you have time constraints.  I want your blood pressure <140/90 consistently. I want to see you in 1 month if your blood pressures are not at this goal.   Check your pressures around 2-3 times per week. Alternate checking in the morning, in the afternoon and before bed. Write them down and bring it to your next appointment.   Let us know if you need anything.

## 2019-10-15 DIAGNOSIS — M9901 Segmental and somatic dysfunction of cervical region: Secondary | ICD-10-CM | POA: Diagnosis not present

## 2019-10-15 DIAGNOSIS — M9902 Segmental and somatic dysfunction of thoracic region: Secondary | ICD-10-CM | POA: Diagnosis not present

## 2019-10-15 DIAGNOSIS — M542 Cervicalgia: Secondary | ICD-10-CM | POA: Diagnosis not present

## 2019-10-15 DIAGNOSIS — M5413 Radiculopathy, cervicothoracic region: Secondary | ICD-10-CM | POA: Diagnosis not present

## 2019-10-16 DIAGNOSIS — M9901 Segmental and somatic dysfunction of cervical region: Secondary | ICD-10-CM | POA: Diagnosis not present

## 2019-10-16 DIAGNOSIS — M542 Cervicalgia: Secondary | ICD-10-CM | POA: Diagnosis not present

## 2019-10-16 DIAGNOSIS — M9902 Segmental and somatic dysfunction of thoracic region: Secondary | ICD-10-CM | POA: Diagnosis not present

## 2019-10-16 DIAGNOSIS — M5413 Radiculopathy, cervicothoracic region: Secondary | ICD-10-CM | POA: Diagnosis not present

## 2019-10-18 DIAGNOSIS — M542 Cervicalgia: Secondary | ICD-10-CM | POA: Diagnosis not present

## 2019-10-18 DIAGNOSIS — M9902 Segmental and somatic dysfunction of thoracic region: Secondary | ICD-10-CM | POA: Diagnosis not present

## 2019-10-18 DIAGNOSIS — M9901 Segmental and somatic dysfunction of cervical region: Secondary | ICD-10-CM | POA: Diagnosis not present

## 2019-10-18 DIAGNOSIS — M5413 Radiculopathy, cervicothoracic region: Secondary | ICD-10-CM | POA: Diagnosis not present

## 2019-10-23 DIAGNOSIS — M9901 Segmental and somatic dysfunction of cervical region: Secondary | ICD-10-CM | POA: Diagnosis not present

## 2019-10-23 DIAGNOSIS — M5413 Radiculopathy, cervicothoracic region: Secondary | ICD-10-CM | POA: Diagnosis not present

## 2019-10-23 DIAGNOSIS — M9902 Segmental and somatic dysfunction of thoracic region: Secondary | ICD-10-CM | POA: Diagnosis not present

## 2019-10-23 DIAGNOSIS — M542 Cervicalgia: Secondary | ICD-10-CM | POA: Diagnosis not present

## 2019-10-25 DIAGNOSIS — M9902 Segmental and somatic dysfunction of thoracic region: Secondary | ICD-10-CM | POA: Diagnosis not present

## 2019-10-25 DIAGNOSIS — M542 Cervicalgia: Secondary | ICD-10-CM | POA: Diagnosis not present

## 2019-10-25 DIAGNOSIS — M5413 Radiculopathy, cervicothoracic region: Secondary | ICD-10-CM | POA: Diagnosis not present

## 2019-10-25 DIAGNOSIS — M9901 Segmental and somatic dysfunction of cervical region: Secondary | ICD-10-CM | POA: Diagnosis not present

## 2019-10-26 ENCOUNTER — Emergency Department (HOSPITAL_BASED_OUTPATIENT_CLINIC_OR_DEPARTMENT_OTHER)
Admission: EM | Admit: 2019-10-26 | Discharge: 2019-10-26 | Disposition: A | Discharge status: Home / Self Care | Payer: Medicare Other | Attending: Emergency Medicine | Admitting: Emergency Medicine

## 2019-10-26 ENCOUNTER — Other Ambulatory Visit: Payer: Self-pay

## 2019-10-26 ENCOUNTER — Emergency Department (HOSPITAL_BASED_OUTPATIENT_CLINIC_OR_DEPARTMENT_OTHER): Payer: Medicare Other

## 2019-10-26 ENCOUNTER — Encounter (HOSPITAL_BASED_OUTPATIENT_CLINIC_OR_DEPARTMENT_OTHER): Payer: Self-pay

## 2019-10-26 DIAGNOSIS — Y929 Unspecified place or not applicable: Secondary | ICD-10-CM | POA: Diagnosis not present

## 2019-10-26 DIAGNOSIS — E119 Type 2 diabetes mellitus without complications: Secondary | ICD-10-CM | POA: Diagnosis not present

## 2019-10-26 DIAGNOSIS — S80911A Unspecified superficial injury of right knee, initial encounter: Secondary | ICD-10-CM | POA: Diagnosis present

## 2019-10-26 DIAGNOSIS — Z79899 Other long term (current) drug therapy: Secondary | ICD-10-CM | POA: Diagnosis not present

## 2019-10-26 DIAGNOSIS — W010XXA Fall on same level from slipping, tripping and stumbling without subsequent striking against object, initial encounter: Secondary | ICD-10-CM | POA: Diagnosis not present

## 2019-10-26 DIAGNOSIS — Y999 Unspecified external cause status: Secondary | ICD-10-CM | POA: Insufficient documentation

## 2019-10-26 DIAGNOSIS — Y9301 Activity, walking, marching and hiking: Secondary | ICD-10-CM | POA: Diagnosis not present

## 2019-10-26 DIAGNOSIS — I5032 Chronic diastolic (congestive) heart failure: Secondary | ICD-10-CM | POA: Diagnosis not present

## 2019-10-26 DIAGNOSIS — M25561 Pain in right knee: Secondary | ICD-10-CM | POA: Diagnosis not present

## 2019-10-26 DIAGNOSIS — S8391XA Sprain of unspecified site of right knee, initial encounter: Secondary | ICD-10-CM | POA: Diagnosis not present

## 2019-10-26 DIAGNOSIS — I110 Hypertensive heart disease with heart failure: Secondary | ICD-10-CM | POA: Insufficient documentation

## 2019-10-26 MED ORDER — HYDROCODONE-ACETAMINOPHEN 5-325 MG PO TABS: 1.0000 | ORAL_TABLET | Freq: Once | ORAL | Status: DC

## 2019-10-26 MED ORDER — ONDANSETRON 4 MG PO TBDP: 4.0000 mg | ORAL_TABLET | Freq: Once | ORAL | Status: DC

## 2019-10-26 NOTE — ED Provider Notes (Signed)
[facility] EMERGENCY DEPARTMENT Provider Note   CSN: [id number] Arrival date & time: 10/26/19  1404     History Chief Complaint  Patient presents with   Knee Injury    Karen Hall is a 64 y.o. female who presents for evaluation of right knee pain after mechanical fall that occurred this morning.  She reports that she was walking outside on her porch and states that she tripped and fell, landing on her right knee.  She denies any head injury or LOC.  She states that since then, she has had pain, swelling noted to the right knee.  She does report that she has been able to put some weight on it but has been using a crutch to help assist her.  She states that it hurts more when she tries to bend her knee.  She denies any numbness/weakness.  The history is provided by the patient.       Past Medical History:  Diagnosis Date   Allergy    ANXIETY 10/18/2007   Qualifier: Diagnosis of  By: Sarajane Jews MD, Ishmael Holter    Autoimmune hepatitis [facility])    Cataract    "beginnings of"   Chronic low back pain    Diabetes ([place]) 02/24/2016   GERD 10/18/2007   Qualifier: Diagnosis of  By: Sherlynn Stalls, CMA, [place]     Hyperlipemia 03/12/2014   HYPERTENSION 10/18/2007   Qualifier: Diagnosis of  By: Sherlynn Stalls, CMA, [place]     Insomnia 03/12/2014   Irritable bowel syndrome 10/18/2007   Qualifier: Diagnosis of  By: Sarajane Jews MD, Ishmael Holter    Osteopenia    Recurrent knee pain    R knee hx meniscal tear 2002 with repair    Patient Active Problem List   Diagnosis Date Noted   Hiatal hernia with GERD 06/27/2019   Chronic cough    Chronic diastolic CHF (congestive heart failure) ([place]) 06/17/2019   Shortness of breath 06/16/2019   Type 2 diabetes mellitus without complication ([place]) 93/26/7124   Obesity 06/16/2019   Depression 06/16/2019   AKI (acute kidney injury) ([place]) 06/16/2019   Upper airway cough syndrome 01/23/2019   Osteopenia 01/13/2018   Diabetes mellitus type 2 in obese ([place])  12/19/2017   Diabetes mellitus ([place]) 02/24/2016   [place] 02/24/2016   Chronic bronchitis ([place]) 07/21/2015   Hyperlipemia 03/12/2014   Insomnia 03/12/2014   Anxiety state 10/18/2007   Essential hypertension 10/18/2007   Allergic rhinitis 10/18/2007   GERD 10/18/2007   IRRITABLE BOWEL SYNDROME 10/18/2007   HEADACHE 10/18/2007    Past Surgical History:  Procedure Laterality Date   BREAST EXCISIONAL BIOPSY Left    Fibroid removed, 24 years ago    BREAST SURGERY     fibroid   CATARACT EXTRACTION Bilateral 09/2018   CHOLECYSTECTOMY     COLONOSCOPY     Around 2010   ESOPHAGOGASTRODUODENOSCOPY (EGD) WITH PROPOFOL N/A 06/27/2019   Procedure: ESOPHAGOGASTRODUODENOSCOPY (EGD) WITH PROPOFOL;  Surgeon: Lavena Bullion, DO;  Location: [facility]OS[facility];  Service: Gastroenterology;  Laterality: N/A;   FOOT SURGERY Right    LIVER BIOPSY  02/2018   MALONEY DILATION  06/27/2019   Procedure: MALONEY DILATION;  Surgeon: Lavena Bullion, DO;  Location: [facility];  Service: Gastroenterology;;   MENISCUS REPAIR Right    TONSILLECTOMY     TRANSORAL INCISIONLESS FUNDOPLICATION N/A 5/80/9983   Procedure: TRANSORAL INCISIONLESS FUNDOPLICATION;  Surgeon: Lavena Bullion, DO;  Location: [facility];  Service: Gastroenterology;  Laterality: N/A;  OB History   No obstetric history on file.     Family History  Problem Relation Age of Onset   Cancer Mother        melanoma   Hyperlipidemia Mother    Breast cancer Mother    Liver cancer Father    Cancer Maternal Grandfather        unknown type   Heart disease Paternal Grandfather    Diabetes Maternal Aunt    Diabetes Sister    Lung disease Neg Hx    Colon cancer Neg Hx    Esophageal cancer Neg Hx    Rectal cancer Neg Hx    Stomach cancer Neg Hx     Social History   Tobacco Use   Smoking status: Never Smoker   Smokeless tobacco: Never Used  Vaping Use   Vaping Use: Never used  Substance  Use Topics   Alcohol use: Not Currently    Alcohol/week: 0.0 standard drinks    Comment: 1 drink monthly   Drug use: No    Home Medications Prior to Admission medications   Medication Sig Start Date End Date Taking? Authorizing Provider  atorvastatin (LIPITOR) 10 MG tablet Take 1 tablet (10 mg total) by mouth daily. 09/10/19   Shelda Pal, DO  Calcium Citrate-Vitamin D (CALCIUM CITRATE + D3) 200-250 MG-UNIT TABS Take 1 tablet by mouth in the morning and at bedtime.    [provider]  Cholecalciferol (VITAMIN D) 50 MCG (2000 UT) tablet Take 2,000 Units by mouth daily.    [provider]  citalopram (CELEXA) 20 MG tablet TAKE 1 TABLET BY MOUTH EVERY DAY Patient taking differently: Take 20 mg by mouth daily.  05/24/19   Shelda Pal, DO  Homeopathic Products [facility] ALLERGY RELIEF NA) Place 1 application into the nose 2 (two) times daily as needed (congestion/allergies).     [provider]  losartan (COZAAR) 25 MG tablet Take 1 tablet (25 mg total) by mouth daily. 10/10/19   Shelda Pal, DO  melatonin 5 MG TABS Take 10 mg by mouth at bedtime.    [provider]  Menthol, Topical Analgesic, (BIOFREEZE EX) Apply 1 application topically at bedtime as needed (pain).    [provider]  omeprazole (PRILOSEC) 20 MG capsule Take 1 capsule (20 mg total) by mouth daily as needed. 08/27/19   Cirigliano, Vito V, DO  Polyethyl Glycol-Propyl Glycol (SYSTANE) 0.4-0.3 % SOLN Place 1 drop into both eyes daily as needed (dry eyes).    [provider]  simethicone (GAS-X) 80 MG chewable tablet Chew 1 tablet (80 mg total) by mouth 4 (four) times daily as needed (gas/bloating). 06/28/19   Pokhrel, Corrie Mckusick, MD    Allergies    Aspartame and phenylalanine and Codeine  Review of Systems   Review of Systems  Musculoskeletal:       Knee pain  Neurological: Negative for weakness and numbness.  All other systems reviewed and are  negative.   Physical Exam Updated Vital Signs BP 138/64    Pulse 86    Temp 98.2 F (36.8 C) (Oral)    Resp 18    SpO2 99%   Physical Exam Vitals and nursing note reviewed.  Constitutional:      Appearance: She is well-developed.  HENT:     Head: Normocephalic and atraumatic.  Eyes:     General: No scleral icterus.       Right eye: No discharge.  Left eye: No discharge.     Conjunctiva/sclera: Conjunctivae normal.  Cardiovascular:     Pulses:          Dorsalis pedis pulses are 2+ on the right side and 2+ on the left side.  Pulmonary:     Effort: Pulmonary effort is normal.  Musculoskeletal:     Comments: Tenderness palpation in the anterior aspect of the right knee with overlying soft tissue swelling.  She has some point tenderness into the mid aspect.  No deformity or crepitus noted.  There is some overlying ecchymosis noted.  No overlying warmth, erythema.  Patient able to extend the leg fully and able to raise the leg off the bed and can hold it against gravity.  Flexion is intact but does report pain with doing so.  No instability noted on varus or valgus stress.  Negative posterior and anterior drawer test.  No tenderness palpation of the right hip, right femur, right tib-fib, right ankle.  No tenderness palpation noted to left lower extremity.  Skin:    General: Skin is warm and dry.     Capillary Refill: Capillary refill takes less than 2 seconds.     Comments: Good distal cap refill.  RLE is not dusky in appearance or cool to touch.  Neurological:     Mental Status: She is alert.     Comments: Sensation intact along major nerve distributions of RLE  Psychiatric:        Speech: Speech normal.        Behavior: Behavior normal.     ED Results / Procedures / Treatments   Labs (all labs ordered are listed, but only abnormal results are displayed) Labs Reviewed - No data to display  EKG None  Radiology DG Knee Complete 4 Views Right  Result Date:  10/26/2019 CLINICAL DATA:  Right knee pain after injury EXAM: RIGHT KNEE - COMPLETE 4+ VIEW COMPARISON:  None. FINDINGS: No evidence of fracture, dislocation, or joint effusion. No evidence of arthropathy or other focal bone abnormality. Soft tissues are unremarkable. IMPRESSION: Negative. Electronically Signed   By: Prudencio Pair M.D.   On: 10/26/2019 15:10    Procedures Procedures (including critical care time)  Medications Ordered in ED Medications  HYDROcodone-acetaminophen (NORCO/VICODIN) 5-325 MG per tablet 1 tablet (1 tablet Oral Refused 10/26/19 1619)  ondansetron (ZOFRAN-ODT) disintegrating tablet 4 mg (4 mg Oral Refused 10/26/19 1619)    ED Course  I have reviewed the triage vital signs and the nursing notes.  Pertinent labs & imaging results that were available during my care of the patient were reviewed by me and considered in my medical decision making (see chart for details).    MDM Rules/Calculators/A&P                         64 year old female who presents for evaluation of right knee pain after mechanical fall that occurred earlier today.  No head injury, LOC.  Reports that she initially took a few steps on it.  She states it hurts more when she bends it.  She has had swelling noted to the area since then.  No numbness/weakness.  On initial arrival, she is afebrile, nontoxic-appearing.  Vital signs are stable.  She is neurovascularly intact.  On initially arrival she has tenderness, swelling in the anterior aspect of the knee.  No deformity or crepitus noted.  Flexion is intact but with subjective reports of pain.  Additionally, extension is intact she  can extend it and hold it off with gravity.  Concern for sprain versus fracture.  X-rays ordered at triage.  X-rays reviewed.  Negative for any acute bony abnormality.  I discussed results with patient.  Given the amount of swelling that she has, I discussed with her regarding further imaging with a CT scan for evaluation of  tibial plateau fracture.  Patient has had a tibial plateau fracture of her left lower extremity before and states that this feels different.  She states she has been able to take a few steps on it but mostly just hurts when she bends it.  After engaging in shared decision making and discussing the risk versus benefits, patient wished to decline CT scan at this time.  We will put her in a brace and have her follow-up with Ortho.  I did offer patient pain medication but she declined. At this time, patient exhibits no emergent life-threatening condition that require further evaluation in ED. Patient had ample opportunity for questions and discussion. All patient's questions were answered with full understanding. Strict return precautions discussed. Patient expresses understanding and agreement to plan.   Portions of this note were generated with Lobbyist. Dictation errors may occur despite best attempts at proofreading.  Final Clinical Impression(s) / ED Diagnoses Final diagnoses:  Sprain of right knee, unspecified ligament, initial encounter    Rx / DC Orders ED Discharge Orders    None       Desma [name] 10/26/19 1733    Gareth Morgan, MD 10/27/19 1108

## 2019-10-26 NOTE — ED Triage Notes (Signed)
Pt states she fell/injured right knee ~1115am-NAD-walking with one crutch

## 2019-10-26 NOTE — Discharge Instructions (Signed)
You can take Tylenol or Ibuprofen as directed for pain. You can alternate Tylenol and Ibuprofen every 4 hours. If you take Tylenol at 1pm, then you can take Ibuprofen at 5pm. Then you can take Tylenol again at 9pm.   Follow the RICE (Rest, Ice, Compression, Elevation) protocol as directed.   Wear the knee brace for support and stabilization.  Follow-up with referred orthopedic doctor.  Return to emergency department for any worsening pain, swelling, numbness/weakness or any other worsening concerning symptoms.

## 2019-11-01 DIAGNOSIS — S8001XA Contusion of right knee, initial encounter: Secondary | ICD-10-CM | POA: Diagnosis not present

## 2019-11-01 DIAGNOSIS — M25561 Pain in right knee: Secondary | ICD-10-CM | POA: Diagnosis not present

## 2019-11-06 DIAGNOSIS — M9902 Segmental and somatic dysfunction of thoracic region: Secondary | ICD-10-CM | POA: Diagnosis not present

## 2019-11-06 DIAGNOSIS — M542 Cervicalgia: Secondary | ICD-10-CM | POA: Diagnosis not present

## 2019-11-06 DIAGNOSIS — M9901 Segmental and somatic dysfunction of cervical region: Secondary | ICD-10-CM | POA: Diagnosis not present

## 2019-11-06 DIAGNOSIS — M5413 Radiculopathy, cervicothoracic region: Secondary | ICD-10-CM | POA: Diagnosis not present

## 2019-11-08 DIAGNOSIS — M9901 Segmental and somatic dysfunction of cervical region: Secondary | ICD-10-CM | POA: Diagnosis not present

## 2019-11-08 DIAGNOSIS — M542 Cervicalgia: Secondary | ICD-10-CM | POA: Diagnosis not present

## 2019-11-08 DIAGNOSIS — M5413 Radiculopathy, cervicothoracic region: Secondary | ICD-10-CM | POA: Diagnosis not present

## 2019-11-08 DIAGNOSIS — M546 Pain in thoracic spine: Secondary | ICD-10-CM | POA: Diagnosis not present

## 2019-11-08 DIAGNOSIS — M9902 Segmental and somatic dysfunction of thoracic region: Secondary | ICD-10-CM | POA: Diagnosis not present

## 2019-11-13 DIAGNOSIS — M5413 Radiculopathy, cervicothoracic region: Secondary | ICD-10-CM | POA: Diagnosis not present

## 2019-11-13 DIAGNOSIS — M542 Cervicalgia: Secondary | ICD-10-CM | POA: Diagnosis not present

## 2019-11-13 DIAGNOSIS — M9902 Segmental and somatic dysfunction of thoracic region: Secondary | ICD-10-CM | POA: Diagnosis not present

## 2019-11-13 DIAGNOSIS — M546 Pain in thoracic spine: Secondary | ICD-10-CM | POA: Diagnosis not present

## 2019-11-13 DIAGNOSIS — M9901 Segmental and somatic dysfunction of cervical region: Secondary | ICD-10-CM | POA: Diagnosis not present

## 2019-11-20 DIAGNOSIS — M9901 Segmental and somatic dysfunction of cervical region: Secondary | ICD-10-CM | POA: Diagnosis not present

## 2019-11-20 DIAGNOSIS — M546 Pain in thoracic spine: Secondary | ICD-10-CM | POA: Diagnosis not present

## 2019-11-20 DIAGNOSIS — M542 Cervicalgia: Secondary | ICD-10-CM | POA: Diagnosis not present

## 2019-11-20 DIAGNOSIS — M9902 Segmental and somatic dysfunction of thoracic region: Secondary | ICD-10-CM | POA: Diagnosis not present

## 2019-11-27 ENCOUNTER — Other Ambulatory Visit: Payer: Self-pay

## 2019-11-27 ENCOUNTER — Other Ambulatory Visit: Payer: Self-pay | Admitting: Family Medicine

## 2019-11-27 ENCOUNTER — Encounter: Payer: Self-pay | Admitting: Family Medicine

## 2019-11-27 ENCOUNTER — Telehealth (INDEPENDENT_AMBULATORY_CARE_PROVIDER_SITE_OTHER): Payer: Medicare Other | Admitting: Family Medicine

## 2019-11-27 VITALS — Temp 97.7°F

## 2019-11-27 DIAGNOSIS — J40 Bronchitis, not specified as acute or chronic: Secondary | ICD-10-CM

## 2019-11-27 MED ORDER — PREDNISONE 20 MG PO TABS: 40.0000 mg | ORAL_TABLET | Freq: Every day | ORAL | 0 refills | Status: DC

## 2019-11-27 MED ORDER — BENZONATATE 100 MG PO CAPS: 100.0000 mg | ORAL_CAPSULE | Freq: Three times a day (TID) | ORAL | 0 refills | Status: DC | PRN

## 2019-11-27 MED ORDER — PREDNISONE 20 MG PO TABS: 40.0000 mg | ORAL_TABLET | Freq: Every day | ORAL | 0 refills | Status: AC

## 2019-11-27 NOTE — Progress Notes (Signed)
Chief Complaint  Patient presents with  . Cough  . Wheezing    Karen Hall here for URI complaints. Due to COVID-19 pandemic, we are interacting via web portal for an electronic face-to-face visit. I verified patient's ID using 2 identifiers. Patient agreed to proceed with visit via this method. Patient is at home, I am at office. Patient and I are present for visit.   Duration: 2 weeks  Associated symptoms: wheezing, chest tightness and cough Denies: sinus congestion, sinus pain, rhinorrhea, itchy watery eyes, ear pain, ear drainage, sore throat, shortness of breath, myalgia and fevers Treatment to date: Mucinex, Tylenol, DM, cough drops Sick contacts: No  Past Medical History:  Diagnosis Date  . Allergy   . ANXIETY 10/18/2007   Qualifier: Diagnosis of  By: Sarajane Jews MD, Ishmael Holter   . Autoimmune hepatitis ([place])   . Cataract    "beginnings of"  . Chronic low back pain   . Diabetes ([place]) 02/24/2016  . GERD 10/18/2007   Qualifier: Diagnosis of  By: Sherlynn Stalls, CMA, [place]    . Hyperlipemia 03/12/2014  . HYPERTENSION 10/18/2007   Qualifier: Diagnosis of  By: Sherlynn Stalls, CMA, [place]    . Insomnia 03/12/2014  . Irritable bowel syndrome 10/18/2007   Qualifier: Diagnosis of  By: Sarajane Jews MD, Ishmael Holter   . Osteopenia   . Recurrent knee pain    R knee hx meniscal tear 2002 with repair    Temp 97.7 F (36.5 C) (Oral)  No conversational dyspnea Age appropriate judgment and insight Nml affect and mood  Wheezy bronchitis - Plan: predniSONE (DELTASONE) 20 MG tablet, benzonatate (TESSALON) 100 MG capsule  Continue to push fluids, practice good hand hygiene, cover mouth when coughing. Info to get tested for covid sent.  F/u prn. If starting to experience fevers, shaking, or shortness of breath, seek immediate care. Pt voiced understanding and agreement to the plan.  [place], DO 11/27/19 10:31 AM

## 2019-12-08 ENCOUNTER — Other Ambulatory Visit: Payer: Self-pay | Admitting: Family Medicine

## 2019-12-08 DIAGNOSIS — J40 Bronchitis, not specified as acute or chronic: Secondary | ICD-10-CM

## 2019-12-08 MED ORDER — BENZONATATE 100 MG PO CAPS: 100.0000 mg | ORAL_CAPSULE | Freq: Three times a day (TID) | ORAL | 0 refills | Status: DC | PRN

## 2019-12-11 DIAGNOSIS — M9901 Segmental and somatic dysfunction of cervical region: Secondary | ICD-10-CM | POA: Diagnosis not present

## 2019-12-11 DIAGNOSIS — M542 Cervicalgia: Secondary | ICD-10-CM | POA: Diagnosis not present

## 2019-12-11 DIAGNOSIS — M546 Pain in thoracic spine: Secondary | ICD-10-CM | POA: Diagnosis not present

## 2019-12-11 DIAGNOSIS — M9902 Segmental and somatic dysfunction of thoracic region: Secondary | ICD-10-CM | POA: Diagnosis not present

## 2019-12-18 ENCOUNTER — Telehealth: Payer: Self-pay | Admitting: Family Medicine

## 2019-12-18 DIAGNOSIS — M9902 Segmental and somatic dysfunction of thoracic region: Secondary | ICD-10-CM | POA: Diagnosis not present

## 2019-12-18 DIAGNOSIS — M9901 Segmental and somatic dysfunction of cervical region: Secondary | ICD-10-CM | POA: Diagnosis not present

## 2019-12-18 DIAGNOSIS — M542 Cervicalgia: Secondary | ICD-10-CM | POA: Diagnosis not present

## 2019-12-18 DIAGNOSIS — M546 Pain in thoracic spine: Secondary | ICD-10-CM | POA: Diagnosis not present

## 2019-12-18 MED ORDER — ATORVASTATIN CALCIUM 10 MG PO TABS: 10.0000 mg | ORAL_TABLET | Freq: Every day | ORAL | 0 refills | Status: DC

## 2019-12-18 NOTE — Telephone Encounter (Signed)
Refill done.  

## 2019-12-18 NOTE — Telephone Encounter (Signed)
Medication: atorvastatin (LIPITOR) 10 MG tablet [[id number]]       Has the patient contacted their pharmacy?  (If no, request that the patient contact the pharmacy for the refill.) (If yes, when and what did the pharmacy advise?)     Preferred Pharmacy (with phone number or street name):  [id number] - [place], [address]  [place], [address] [address]  Phone:  [phone] Fax:  [phone]    Agent: Please be advised that RX refills may take up to 3 business days. We ask that you follow-up with your pharmacy.

## 2019-12-25 DIAGNOSIS — M546 Pain in thoracic spine: Secondary | ICD-10-CM | POA: Diagnosis not present

## 2019-12-25 DIAGNOSIS — M9902 Segmental and somatic dysfunction of thoracic region: Secondary | ICD-10-CM | POA: Diagnosis not present

## 2019-12-25 DIAGNOSIS — M9901 Segmental and somatic dysfunction of cervical region: Secondary | ICD-10-CM | POA: Diagnosis not present

## 2019-12-25 DIAGNOSIS — M542 Cervicalgia: Secondary | ICD-10-CM | POA: Diagnosis not present

## 2020-01-08 DIAGNOSIS — M542 Cervicalgia: Secondary | ICD-10-CM | POA: Diagnosis not present

## 2020-01-08 DIAGNOSIS — M9901 Segmental and somatic dysfunction of cervical region: Secondary | ICD-10-CM | POA: Diagnosis not present

## 2020-01-08 DIAGNOSIS — M546 Pain in thoracic spine: Secondary | ICD-10-CM | POA: Diagnosis not present

## 2020-01-08 DIAGNOSIS — M9902 Segmental and somatic dysfunction of thoracic region: Secondary | ICD-10-CM | POA: Diagnosis not present

## 2020-01-11 ENCOUNTER — Ambulatory Visit: Payer: Medicare Other | Admitting: Family Medicine

## 2020-01-15 ENCOUNTER — Ambulatory Visit: Payer: Medicare Other | Admitting: Family Medicine

## 2020-01-22 DIAGNOSIS — M546 Pain in thoracic spine: Secondary | ICD-10-CM | POA: Diagnosis not present

## 2020-01-22 DIAGNOSIS — M9903 Segmental and somatic dysfunction of lumbar region: Secondary | ICD-10-CM | POA: Diagnosis not present

## 2020-01-22 DIAGNOSIS — M9902 Segmental and somatic dysfunction of thoracic region: Secondary | ICD-10-CM | POA: Diagnosis not present

## 2020-01-22 DIAGNOSIS — M545 Low back pain, unspecified: Secondary | ICD-10-CM | POA: Diagnosis not present

## 2020-01-23 ENCOUNTER — Ambulatory Visit (INDEPENDENT_AMBULATORY_CARE_PROVIDER_SITE_OTHER): Payer: Medicare Other | Admitting: Family Medicine

## 2020-01-23 ENCOUNTER — Encounter: Payer: Self-pay | Admitting: Family Medicine

## 2020-01-23 ENCOUNTER — Other Ambulatory Visit: Payer: Self-pay

## 2020-01-23 VITALS — BP 128/72 | HR 67 | Temp 98.4°F | Ht 66.5 in | Wt 205.0 lb

## 2020-01-23 DIAGNOSIS — I10 Essential (primary) hypertension: Secondary | ICD-10-CM

## 2020-01-23 DIAGNOSIS — M25562 Pain in left knee: Secondary | ICD-10-CM

## 2020-01-23 DIAGNOSIS — M25561 Pain in right knee: Secondary | ICD-10-CM

## 2020-01-23 DIAGNOSIS — G8929 Other chronic pain: Secondary | ICD-10-CM | POA: Diagnosis not present

## 2020-01-23 MED ORDER — LOSARTAN POTASSIUM 25 MG PO TABS: 25.0000 mg | ORAL_TABLET | Freq: Every day | ORAL | 2 refills | Status: DC

## 2020-01-23 NOTE — Patient Instructions (Signed)
Because your blood pressure is well-controlled, you no longer have to check your blood pressure at home anymore unless you wish. Some people check it twice daily every day and some people stop altogether. Either or anything in between is fine. Strong work!  Keep the diet clean and stay active.  If you do not hear anything about your referral in the next 1-2 weeks, call our office and ask for an update.  Let us know if you need anything.  Claritin (loratadine), Allegra (fexofenadine), Zyrtec (cetirizine) which is also equivalent to Xyzal (levocetirizine); these are listed in order from weakest to strongest. Generic, and therefore cheaper, options are in the parentheses.   Flonase (fluticasone); nasal spray that is over the counter. 2 sprays each nostril, once daily. Aim towards the same side eye when you spray.  There are available OTC, and the generic versions, which may be cheaper, are in parentheses. Show this to a pharmacist if you have trouble finding any of these items.

## 2020-01-23 NOTE — Progress Notes (Signed)
Chief Complaint  Patient presents with  . Follow-up    blood pressure    Subjective Karen Hall is a 64 y.o. female who presents for hypertension follow up. She does monitor home blood pressures. Blood pressures ranging from 100-120's/60-80's on average. She is compliant with medication- losartan 25 mg/d. Patient has these side effects of medication: none She is not adhering to a healthy diet overall. Current exercise: none No CP or SOB  Many years of b/l knee pain, hurts below the knee jt. No swelling of knee or decreased ROM. Feels there is fat or fluid in front of the area. No bruising or redness. She went to the ortho who said she did not have any OA or surgical. She has been to PT several times without relief. Hx of meniscal resection/scope.    Past Medical History:  Diagnosis Date  . Allergy   . ANXIETY 10/18/2007   Qualifier: Diagnosis of  By: Sarajane Jews MD, Ishmael Holter   . Autoimmune hepatitis ([place])   . Cataract    "beginnings of"  . Chronic low back pain   . Diabetes ([place]) 02/24/2016  . GERD 10/18/2007   Qualifier: Diagnosis of  By: Sherlynn Stalls, CMA, [place]    . Hyperlipemia 03/12/2014  . HYPERTENSION 10/18/2007   Qualifier: Diagnosis of  By: Sherlynn Stalls, CMA, [place]    . Insomnia 03/12/2014  . Irritable bowel syndrome 10/18/2007   Qualifier: Diagnosis of  By: Sarajane Jews MD, Ishmael Holter   . Osteopenia   . Recurrent knee pain    R knee hx meniscal tear 2002 with repair    Exam BP 128/72 (BP Location: Left Arm, Patient Position: Sitting, Cuff Size: Normal)   Pulse 67   Temp 98.4 F (36.9 C) (Oral)   Ht 5' 6.5" (1.689 m)   Wt 205 lb (93 kg)   SpO2 96%   BMI 32.59 kg/m  General:  well developed, well nourished, in no apparent distress Heart: RRR, no bruits, no LE edema Lungs: clear to auscultation, no accessory muscle use MSK: +TTP over anterior tibia b/l, no deformity or effusion, no excessive warmth or erythema. Nml active/passive ROM.  Psych: well oriented with normal range of affect  and appropriate judgment/insight  Essential hypertension - Plan: losartan (COZAAR) 25 MG tablet  Chronic pain of both knees - Plan: Ambulatory referral to Sports Medicine  1. Cont losartan 25 mg/d. Does not need to monitor BP at home anymore. Counseled on diet and exercise. 2. Refer to Dr Chauncey Cruel upstairs for aid in helping this pt.  F/u in 6 mo for CPE or prn. The patient voiced understanding and agreement to the plan.  [place], DO 01/23/20  2:30 PM

## 2020-01-24 DIAGNOSIS — M9902 Segmental and somatic dysfunction of thoracic region: Secondary | ICD-10-CM | POA: Diagnosis not present

## 2020-01-24 DIAGNOSIS — M546 Pain in thoracic spine: Secondary | ICD-10-CM | POA: Diagnosis not present

## 2020-01-24 DIAGNOSIS — M9903 Segmental and somatic dysfunction of lumbar region: Secondary | ICD-10-CM | POA: Diagnosis not present

## 2020-01-24 DIAGNOSIS — M545 Low back pain, unspecified: Secondary | ICD-10-CM | POA: Diagnosis not present

## 2020-01-25 ENCOUNTER — Encounter: Payer: Self-pay | Admitting: Family Medicine

## 2020-01-25 ENCOUNTER — Ambulatory Visit: Payer: Medicare Other | Admitting: Family Medicine

## 2020-01-25 ENCOUNTER — Other Ambulatory Visit: Payer: Self-pay

## 2020-01-25 ENCOUNTER — Ambulatory Visit: Payer: Self-pay

## 2020-01-25 VITALS — BP 133/95 | HR 106 | Ht 66.0 in | Wt 205.0 lb

## 2020-01-25 DIAGNOSIS — M25561 Pain in right knee: Secondary | ICD-10-CM

## 2020-01-25 DIAGNOSIS — M222X1 Patellofemoral disorders, right knee: Secondary | ICD-10-CM

## 2020-01-25 HISTORY — DX: Patellofemoral disorders, right knee: M22.2X1

## 2020-01-25 NOTE — Assessment & Plan Note (Signed)
Does have hematoma and small cystic change overlying the patellar tendon.  Likely has patellofemoral contribution as well. -Counseled on home exercise therapy and supportive care. -Pennsaid samples. -Could consider physical therapy or further imaging if needed.

## 2020-01-25 NOTE — Progress Notes (Signed)
Medication Samples have been provided to the patient.  Drug name: Pennsaid       Strength: 2%        Qty: 2 boxes  LOT: [id number]  Exp.Date: 05/2020  Dosing instructions: use a pea size amount and rub gently.  The patient has been instructed regarding the correct time, dose, and frequency of taking this medication, including desired effects and most common side effects.   Sherrie [place], [place] 11:57 AM 01/25/2020

## 2020-01-25 NOTE — Patient Instructions (Signed)
Nice to meet you Please try heat and compression on the right  Please try the exercises  Please try the pennsaid   Please send me a message in [place] with any questions or updates.  Please see me back in 4 weeks.   --Dr. Raeford Razor

## 2020-01-25 NOTE — Progress Notes (Signed)
Karen Hall - 64 y.o. female MRN [id number]  Date of birth: [birth year]  SUBJECTIVE:  Including CC & ROS.  Chief Complaint  Patient presents with  . Knee Pain    bilateral    Karen Hall is a 64 y.o. female that is presenting with right knee pain.  Has been ongoing for the past few months.  Is anterior in nature.  It is tender to the touch.  Denies any mechanical symptoms.  It is worse with going up and down stairs or getting up from seated position..  Independent review of the right knee x-ray from 8/13 shows no acute changes.   Review of Systems See HPI   HISTORY: Past Medical, Surgical, Social, and Family History Reviewed & Updated per EMR.   Pertinent Historical Findings include:  Past Medical History:  Diagnosis Date  . Allergy   . ANXIETY 10/18/2007   Qualifier: Diagnosis of  By: Sarajane Jews MD, Ishmael Holter   . Autoimmune hepatitis ([place])   . Cataract    "beginnings of"  . Chronic low back pain   . Diabetes ([place]) 02/24/2016  . GERD 10/18/2007   Qualifier: Diagnosis of  By: Sherlynn Stalls, CMA, [place]    . Hyperlipemia 03/12/2014  . HYPERTENSION 10/18/2007   Qualifier: Diagnosis of  By: Sherlynn Stalls, CMA, [place]    . Insomnia 03/12/2014  . Irritable bowel syndrome 10/18/2007   Qualifier: Diagnosis of  By: Sarajane Jews MD, Ishmael Holter   . Osteopenia   . Recurrent knee pain    R knee hx meniscal tear 2002 with repair    Past Surgical History:  Procedure Laterality Date  . BREAST EXCISIONAL BIOPSY Left    Fibroid removed, 24 years ago   . BREAST SURGERY     fibroid  . CATARACT EXTRACTION Bilateral 09/2018  . CHOLECYSTECTOMY    . COLONOSCOPY     Around 2010  . ESOPHAGOGASTRODUODENOSCOPY (EGD) WITH PROPOFOL N/A 06/27/2019   Procedure: ESOPHAGOGASTRODUODENOSCOPY (EGD) WITH PROPOFOL;  Surgeon: Lavena Bullion, DO;  Location: [facility];  Service: Gastroenterology;  Laterality: N/A;  . FOOT SURGERY Right   . LIVER BIOPSY  02/2018  . MALONEY DILATION  06/27/2019   Procedure: MALONEY DILATION;   Surgeon: Lavena Bullion, DO;  Location: [facility];  Service: Gastroenterology;;  . MENISCUS REPAIR Right   . TONSILLECTOMY    . TRANSORAL INCISIONLESS FUNDOPLICATION N/A 9/56/2130   Procedure: TRANSORAL INCISIONLESS FUNDOPLICATION;  Surgeon: Lavena Bullion, DO;  Location: [facility];  Service: Gastroenterology;  Laterality: N/A;    Family History  Problem Relation Age of Onset  . Cancer Mother        melanoma  . Hyperlipidemia Mother   . Breast cancer Mother   . Liver cancer Father   . Cancer Maternal Grandfather        unknown type  . Heart disease Paternal Grandfather   . Diabetes Maternal Aunt   . Diabetes Sister   . Lung disease Neg Hx   . Colon cancer Neg Hx   . Esophageal cancer Neg Hx   . Rectal cancer Neg Hx   . Stomach cancer Neg Hx     Social History   Socioeconomic History  . Marital status: Widowed    Spouse name: Not on file  . Number of children: 0  . Years of education: Not on file  . Highest education level: Not on file  Occupational History  . Not on file  Tobacco Use  . Smoking status: Never Smoker  .  Smokeless tobacco: Never Used  Vaping Use  . Vaping Use: Never used  Substance and Sexual Activity  . Alcohol use: Not Currently    Alcohol/week: 0.0 standard drinks    Comment: 1 drink monthly  . Drug use: No  . Sexual activity: Not on file  Other Topics Concern  . Not on file  Social History Narrative   Work or School: Press photographer - [employer]       Home Situation: lives alone      Spiritual Beliefs: none      Lifestyle: no regular exercise; poor diet      [facility]:   Originally from [place]. She has lived in [place], [place], [place], [place]. She has a dog currently. No bird exposure. No hot tub exposure. No mold exposure. Currently works as a Adult nurse.       Social Determinants of Health   Financial Resource Strain:   . Difficulty of Paying Living Expenses: Not on file  Food Insecurity:   .  Worried About Charity fundraiser in the Last Year: Not on file  . Ran Out of Food in the Last Year: Not on file  Transportation Needs:   . Lack of Transportation (Medical): Not on file  . Lack of Transportation (Non-Medical): Not on file  Physical Activity:   . Days of Exercise per Week: Not on file  . Minutes of Exercise per Session: Not on file  Stress:   . Feeling of Stress : Not on file  Social Connections:   . Frequency of Communication with Friends and Family: Not on file  . Frequency of Social Gatherings with Friends and Family: Not on file  . Attends Religious Services: Not on file  . Active Member of Clubs or Organizations: Not on file  . Attends Archivist Meetings: Not on file  . Marital Status: Not on file  Intimate Partner Violence:   . Fear of Current or Ex-Partner: Not on file  . Emotionally Abused: Not on file  . Physically Abused: Not on file  . Sexually Abused: Not on file     PHYSICAL EXAM:  VS: BP (!) 133/95   Pulse (!) 106   Ht 5\' 6"  (1.676 m)   Wt 205 lb (93 kg)   BMI 33.09 kg/m  Physical Exam Gen: NAD, alert, cooperative with exam, well-appearing MSK:  Right knee: No effusion. Tenderness palpation at the insertion of the patellar tendon. Normal range of motion. No instability. Neurovascularly intact  Limited ultrasound: Right knee:  No effusion. Normal-appearing quadricep and patellar tendon. Hypoechoic change overlying the superficial aspect of the patellar tendon as well as a cystic circular formation.  No hyperemia associated this area. Mild degenerative changes of the medial meniscus. Normal-appearing lateral meniscus.   Summary: Findings suggestive of a hematoma overlying the patellar tendon.  Ultrasound and interpretation by Clearance Coots, MD    ASSESSMENT & PLAN:   Patellofemoral pain syndrome of right knee Does have hematoma and small cystic change overlying the patellar tendon.  Likely has patellofemoral  contribution as well. -Counseled on home exercise therapy and supportive care. -[place] samples. -Could consider physical therapy or further imaging if needed.

## 2020-01-31 DIAGNOSIS — M546 Pain in thoracic spine: Secondary | ICD-10-CM | POA: Diagnosis not present

## 2020-01-31 DIAGNOSIS — M545 Low back pain, unspecified: Secondary | ICD-10-CM | POA: Diagnosis not present

## 2020-01-31 DIAGNOSIS — M9903 Segmental and somatic dysfunction of lumbar region: Secondary | ICD-10-CM | POA: Diagnosis not present

## 2020-01-31 DIAGNOSIS — M9902 Segmental and somatic dysfunction of thoracic region: Secondary | ICD-10-CM | POA: Diagnosis not present

## 2020-02-05 ENCOUNTER — Other Ambulatory Visit: Payer: Self-pay | Admitting: Family Medicine

## 2020-02-05 DIAGNOSIS — I10 Essential (primary) hypertension: Secondary | ICD-10-CM

## 2020-02-05 MED ORDER — LOSARTAN POTASSIUM 25 MG PO TABS: 25.0000 mg | ORAL_TABLET | Freq: Every day | ORAL | 2 refills | Status: DC

## 2020-02-06 DIAGNOSIS — M545 Low back pain, unspecified: Secondary | ICD-10-CM | POA: Diagnosis not present

## 2020-02-06 DIAGNOSIS — M546 Pain in thoracic spine: Secondary | ICD-10-CM | POA: Diagnosis not present

## 2020-02-06 DIAGNOSIS — M9902 Segmental and somatic dysfunction of thoracic region: Secondary | ICD-10-CM | POA: Diagnosis not present

## 2020-02-06 DIAGNOSIS — M9903 Segmental and somatic dysfunction of lumbar region: Secondary | ICD-10-CM | POA: Diagnosis not present

## 2020-02-12 DIAGNOSIS — M545 Low back pain, unspecified: Secondary | ICD-10-CM | POA: Diagnosis not present

## 2020-02-12 DIAGNOSIS — M546 Pain in thoracic spine: Secondary | ICD-10-CM | POA: Diagnosis not present

## 2020-02-12 DIAGNOSIS — M9902 Segmental and somatic dysfunction of thoracic region: Secondary | ICD-10-CM | POA: Diagnosis not present

## 2020-02-12 DIAGNOSIS — M9903 Segmental and somatic dysfunction of lumbar region: Secondary | ICD-10-CM | POA: Diagnosis not present

## 2020-02-14 DIAGNOSIS — M546 Pain in thoracic spine: Secondary | ICD-10-CM | POA: Diagnosis not present

## 2020-02-14 DIAGNOSIS — M9902 Segmental and somatic dysfunction of thoracic region: Secondary | ICD-10-CM | POA: Diagnosis not present

## 2020-02-14 DIAGNOSIS — M9903 Segmental and somatic dysfunction of lumbar region: Secondary | ICD-10-CM | POA: Diagnosis not present

## 2020-02-14 DIAGNOSIS — M545 Low back pain, unspecified: Secondary | ICD-10-CM | POA: Diagnosis not present

## 2020-02-18 DIAGNOSIS — M546 Pain in thoracic spine: Secondary | ICD-10-CM | POA: Diagnosis not present

## 2020-02-18 DIAGNOSIS — M545 Low back pain, unspecified: Secondary | ICD-10-CM | POA: Diagnosis not present

## 2020-02-18 DIAGNOSIS — M9903 Segmental and somatic dysfunction of lumbar region: Secondary | ICD-10-CM | POA: Diagnosis not present

## 2020-02-18 DIAGNOSIS — M9902 Segmental and somatic dysfunction of thoracic region: Secondary | ICD-10-CM | POA: Diagnosis not present

## 2020-02-25 ENCOUNTER — Ambulatory Visit: Payer: Medicare Other | Admitting: Family Medicine

## 2020-02-25 DIAGNOSIS — M9902 Segmental and somatic dysfunction of thoracic region: Secondary | ICD-10-CM | POA: Diagnosis not present

## 2020-02-25 DIAGNOSIS — M545 Low back pain, unspecified: Secondary | ICD-10-CM | POA: Diagnosis not present

## 2020-02-25 DIAGNOSIS — M9903 Segmental and somatic dysfunction of lumbar region: Secondary | ICD-10-CM | POA: Diagnosis not present

## 2020-02-25 DIAGNOSIS — M546 Pain in thoracic spine: Secondary | ICD-10-CM | POA: Diagnosis not present

## 2020-03-14 ENCOUNTER — Other Ambulatory Visit: Payer: Self-pay | Admitting: Family Medicine

## 2020-05-24 ENCOUNTER — Other Ambulatory Visit: Payer: Self-pay | Admitting: Family Medicine

## 2020-05-29 DIAGNOSIS — Z961 Presence of intraocular lens: Secondary | ICD-10-CM | POA: Diagnosis not present

## 2020-05-29 DIAGNOSIS — H26491 Other secondary cataract, right eye: Secondary | ICD-10-CM | POA: Diagnosis not present

## 2020-05-29 DIAGNOSIS — H52223 Regular astigmatism, bilateral: Secondary | ICD-10-CM | POA: Diagnosis not present

## 2020-05-29 DIAGNOSIS — H524 Presbyopia: Secondary | ICD-10-CM | POA: Diagnosis not present

## 2020-05-29 DIAGNOSIS — H5213 Myopia, bilateral: Secondary | ICD-10-CM | POA: Diagnosis not present

## 2020-05-29 DIAGNOSIS — H43813 Vitreous degeneration, bilateral: Secondary | ICD-10-CM | POA: Diagnosis not present

## 2020-06-12 ENCOUNTER — Other Ambulatory Visit: Payer: Self-pay | Admitting: Family Medicine

## 2020-07-23 ENCOUNTER — Encounter: Payer: Medicare Other | Admitting: Family Medicine

## 2020-08-01 ENCOUNTER — Other Ambulatory Visit: Payer: Self-pay | Admitting: *Deleted

## 2020-08-01 MED ORDER — CITALOPRAM HYDROBROMIDE 20 MG PO TABS: 20.0000 mg | ORAL_TABLET | Freq: Every day | ORAL | 1 refills | Status: DC

## 2020-08-12 ENCOUNTER — Encounter: Payer: Self-pay | Admitting: Family Medicine

## 2020-08-12 ENCOUNTER — Ambulatory Visit (INDEPENDENT_AMBULATORY_CARE_PROVIDER_SITE_OTHER): Payer: Medicare Other | Admitting: Family Medicine

## 2020-08-12 ENCOUNTER — Other Ambulatory Visit: Payer: Self-pay

## 2020-08-12 VITALS — BP 122/92 | HR 105 | Temp 98.1°F | Ht 66.5 in | Wt 201.4 lb

## 2020-08-12 DIAGNOSIS — R413 Other amnesia: Secondary | ICD-10-CM | POA: Diagnosis not present

## 2020-08-12 DIAGNOSIS — G4700 Insomnia, unspecified: Secondary | ICD-10-CM

## 2020-08-12 DIAGNOSIS — M791 Myalgia, unspecified site: Secondary | ICD-10-CM

## 2020-08-12 MED ORDER — AMITRIPTYLINE HCL 25 MG PO TABS: 25.0000 mg | ORAL_TABLET | Freq: Every day | ORAL | 2 refills | Status: DC

## 2020-08-12 MED ORDER — ZOLPIDEM TARTRATE ER 6.25 MG PO TBCR: EXTENDED_RELEASE_TABLET | ORAL | 2 refills | Status: DC

## 2020-08-12 NOTE — Progress Notes (Signed)
Chief Complaint  Patient presents with  . Insomnia  . Memory Loss  . Hypertension    Subjective: Patient is a 65 y.o. female here for insomnia.  Chronic issue, has ben taking Tylenol PM and melatonin without relief. Once she falls asleep she is fine. Not getting better. Takes 3-4 hours to fall asleep. Heads to bed 10-12 at night. She tried trazodone without relief. Ambien has worked well in the past. Marena Chancy if she snores. She has GAD but is well controlled w Celexa 20 mg/d.   Tender points along both legs. No inj or change in activity. No swelling, redness, neuro s/s's.   Pt w worsening memory over past few weeks. No recent changes in medications. No wt changes, recent illness, depression, or anxiety.  Past Medical History:  Diagnosis Date  . Allergy   . ANXIETY 10/18/2007   Qualifier: Diagnosis of  By: Sarajane Jews MD, Ishmael Holter   . Autoimmune hepatitis ([place])   . Cataract    "beginnings of"  . Chronic low back pain   . Diabetes ([place]) 02/24/2016  . GERD 10/18/2007   Qualifier: Diagnosis of  By: Sherlynn Stalls, CMA, [place]    . Hyperlipemia 03/12/2014  . HYPERTENSION 10/18/2007   Qualifier: Diagnosis of  By: Sherlynn Stalls, CMA, [place]    . Insomnia 03/12/2014  . Irritable bowel syndrome 10/18/2007   Qualifier: Diagnosis of  By: Sarajane Jews MD, Ishmael Holter   . Osteopenia   . Recurrent knee pain    R knee hx meniscal tear 2002 with repair    Objective: BP (!) 122/92 (BP Location: Left Arm, Patient Position: Sitting, Cuff Size: Normal)   Pulse (!) 105   Temp 98.1 F (36.7 C) (Oral)   Ht 5' 6.5" (1.689 m)   Wt 201 lb 6 oz (91.3 kg)   SpO2 96%   BMI 32.02 kg/m  General: Awake, appears stated age HEENT: MMM, EOMi Heart: RRR Lungs: CTAB, no rales, wheezes or rhonchi. No accessory muscle use MSK: She has very tender points over bilateral medial calves, thighs, and anterior legs.  No nodules or edema noted. Neuro: Gait is normal, no cerebellar signs Psych: Age appropriate judgment and insight, normal affect and  mood  Assessment and Plan: Insomnia, unspecified type - Plan: amitriptyline (ELAVIL) 25 MG tablet  Trigger point - Plan: amitriptyline (ELAVIL) 25 MG tablet  Memory changes - Plan: CBC, Comprehensive metabolic panel, TSH, T4, free, B12  1. Chronic, uncontrolled.  Continue Celexa 20 mg daily. [facility] info provided.  Start Elavil 25 mg nightly.  I also sent in a prescription for controlled release Ambien should this not improve.  I believe better sleep will improve her blood pressure as well.  She will monitor at home. 2.  Elavil 25 mg nightly.  Start doing yoga/tai chi.  Stretching will be helpful.  Consider heat. 3.  Check above labs.  If negative will order CT of the head.  If that is negative, we will set her up with a formal evaluation with a neuropsychology team. I would like to recheck #1 and 2 in 1 month. The patient voiced understanding and agreement to the plan.  [place], DO 08/12/20  4:53 PM

## 2020-08-12 NOTE — Patient Instructions (Addendum)
Sleep is important to [place] all. Getting good sleep is imperative to adequate functioning during the day. Work with our counselors who are trained to help people obtain quality sleep. Call [phone] to schedule an appointment or if you are curious about insurance coverage/cost.  Sleep Hygiene Tips:  Do not watch TV or look at screens within 1 hour of going to bed. If you do, make sure there is a blue light filter (nighttime mode) involved.  Try to go to bed around the same time every night. Wake up at the same time within 1 hour of regular time. Ex: If you wake up at 7 AM for work, do not sleep past 8 AM on days that you don't work.  Do not drink alcohol before bedtime.  Do not consume caffeine-containing beverages after noon or within 9 hours of intended bedtime.  Get regular exercise/physical activity in your life, but not within 2 hours of planned bedtime.  Do not take naps.   Do not eat within 2 hours of planned bedtime.  Melatonin, 3-5 mg 30-60 minutes before planned bedtime may be helpful.   The bed should be for sleep or sex only. If after 20-30 minutes you are unable to fall asleep, get up and do something relaxing. Do this until you feel ready to go to sleep again.   Aim to do some physical exertion for 150 minutes per week. This is typically divided into 5 days per week, 30 minutes per day. The activity should be enough to get your heart rate up. Anything is better than nothing if you have time constraints. Start doing yoga.  Heat (pad or rice pillow in microwave) over affected area, 10-15 minutes twice daily.   Give [place] 2-3 business days to get the results of your labs back.   Let us know if you need anything.

## 2020-08-13 ENCOUNTER — Encounter: Payer: Self-pay | Admitting: Family Medicine

## 2020-08-13 ENCOUNTER — Other Ambulatory Visit: Payer: Self-pay | Admitting: Family Medicine

## 2020-08-13 ENCOUNTER — Other Ambulatory Visit (INDEPENDENT_AMBULATORY_CARE_PROVIDER_SITE_OTHER): Payer: Medicare Other

## 2020-08-13 DIAGNOSIS — R739 Hyperglycemia, unspecified: Secondary | ICD-10-CM

## 2020-08-13 DIAGNOSIS — E1165 Type 2 diabetes mellitus with hyperglycemia: Secondary | ICD-10-CM | POA: Insufficient documentation

## 2020-08-13 DIAGNOSIS — R413 Other amnesia: Secondary | ICD-10-CM

## 2020-08-13 LAB — CBC
HCT: 41.2 % (ref 36.0–46.0)
Hemoglobin: 13.6 g/dL (ref 12.0–15.0)
MCHC: 33.1 g/dL (ref 30.0–36.0)
MCV: 86 fl (ref 78.0–100.0)
Platelets: 243 10*3/uL (ref 150.0–400.0)
RBC: 4.79 Mil/uL (ref 3.87–5.11)
RDW: 13.1 % (ref 11.5–15.5)
WBC: 7.4 10*3/uL (ref 4.0–10.5)

## 2020-08-13 LAB — COMPREHENSIVE METABOLIC PANEL
ALT: 20 U/L (ref 0–35)
AST: 24 U/L (ref 0–37)
Albumin: 4 g/dL (ref 3.5–5.2)
Alkaline Phosphatase: 204 U/L — ABNORMAL HIGH (ref 39–117)
BUN: 14 mg/dL (ref 6–23)
CO2: 24 mEq/L (ref 19–32)
Calcium: 9.8 mg/dL (ref 8.4–10.5)
Chloride: 105 mEq/L (ref 96–112)
Creatinine, Ser: 0.99 mg/dL (ref 0.40–1.20)
GFR: 59.21 mL/min — ABNORMAL LOW (ref >60.00)
Glucose, Bld: 158 mg/dL — ABNORMAL HIGH (ref 70–99)
Potassium: 4 mEq/L (ref 3.5–5.1)
Sodium: 137 mEq/L (ref 135–145)
Total Bilirubin: 0.4 mg/dL (ref 0.2–1.2)
Total Protein: 6.7 g/dL (ref 6.0–8.3)

## 2020-08-13 LAB — HEMOGLOBIN A1C: Hgb A1c MFr Bld: 7 % — ABNORMAL HIGH (ref 4.6–6.5)

## 2020-08-13 LAB — TSH: TSH: 1.12 u[IU]/mL (ref 0.35–4.50)

## 2020-08-13 LAB — T4, FREE: Free T4: 0.7 ng/dL (ref 0.60–1.60)

## 2020-08-13 LAB — VITAMIN B12: Vitamin B-12: 383 pg/mL (ref 211–911)

## 2020-08-15 ENCOUNTER — Ambulatory Visit (HOSPITAL_BASED_OUTPATIENT_CLINIC_OR_DEPARTMENT_OTHER)
Admission: RE | Admit: 2020-08-15 | Discharge: 2020-08-15 | Disposition: A | Discharge status: Home / Self Care | Payer: Medicare Other | Source: Ambulatory Visit | Attending: Family Medicine | Admitting: Family Medicine

## 2020-08-15 ENCOUNTER — Other Ambulatory Visit: Payer: Self-pay

## 2020-08-15 DIAGNOSIS — R413 Other amnesia: Secondary | ICD-10-CM | POA: Diagnosis not present

## 2020-08-18 ENCOUNTER — Other Ambulatory Visit: Payer: Self-pay | Admitting: Family Medicine

## 2020-08-18 ENCOUNTER — Encounter: Payer: Self-pay | Admitting: Counselor

## 2020-08-18 DIAGNOSIS — R413 Other amnesia: Secondary | ICD-10-CM

## 2020-08-18 DIAGNOSIS — M4713 Other spondylosis with myelopathy, cervicothoracic region: Secondary | ICD-10-CM | POA: Diagnosis not present

## 2020-08-18 DIAGNOSIS — M9902 Segmental and somatic dysfunction of thoracic region: Secondary | ICD-10-CM | POA: Diagnosis not present

## 2020-08-18 DIAGNOSIS — M9903 Segmental and somatic dysfunction of lumbar region: Secondary | ICD-10-CM | POA: Diagnosis not present

## 2020-08-18 DIAGNOSIS — M47816 Spondylosis without myelopathy or radiculopathy, lumbar region: Secondary | ICD-10-CM | POA: Diagnosis not present

## 2020-08-18 DIAGNOSIS — M9905 Segmental and somatic dysfunction of pelvic region: Secondary | ICD-10-CM | POA: Diagnosis not present

## 2020-08-18 DIAGNOSIS — R519 Headache, unspecified: Secondary | ICD-10-CM | POA: Diagnosis not present

## 2020-08-18 DIAGNOSIS — M9901 Segmental and somatic dysfunction of cervical region: Secondary | ICD-10-CM | POA: Diagnosis not present

## 2020-09-01 DIAGNOSIS — M9901 Segmental and somatic dysfunction of cervical region: Secondary | ICD-10-CM | POA: Diagnosis not present

## 2020-09-01 DIAGNOSIS — R519 Headache, unspecified: Secondary | ICD-10-CM | POA: Diagnosis not present

## 2020-09-01 DIAGNOSIS — M4713 Other spondylosis with myelopathy, cervicothoracic region: Secondary | ICD-10-CM | POA: Diagnosis not present

## 2020-09-01 DIAGNOSIS — M47816 Spondylosis without myelopathy or radiculopathy, lumbar region: Secondary | ICD-10-CM | POA: Diagnosis not present

## 2020-09-01 DIAGNOSIS — M9902 Segmental and somatic dysfunction of thoracic region: Secondary | ICD-10-CM | POA: Diagnosis not present

## 2020-09-03 ENCOUNTER — Other Ambulatory Visit: Payer: Self-pay | Admitting: Family Medicine

## 2020-09-03 DIAGNOSIS — M791 Myalgia, unspecified site: Secondary | ICD-10-CM

## 2020-09-03 DIAGNOSIS — M9901 Segmental and somatic dysfunction of cervical region: Secondary | ICD-10-CM | POA: Diagnosis not present

## 2020-09-03 DIAGNOSIS — M47816 Spondylosis without myelopathy or radiculopathy, lumbar region: Secondary | ICD-10-CM | POA: Diagnosis not present

## 2020-09-03 DIAGNOSIS — M4713 Other spondylosis with myelopathy, cervicothoracic region: Secondary | ICD-10-CM | POA: Diagnosis not present

## 2020-09-03 DIAGNOSIS — G4700 Insomnia, unspecified: Secondary | ICD-10-CM

## 2020-09-03 DIAGNOSIS — R519 Headache, unspecified: Secondary | ICD-10-CM | POA: Diagnosis not present

## 2020-09-03 DIAGNOSIS — M9902 Segmental and somatic dysfunction of thoracic region: Secondary | ICD-10-CM | POA: Diagnosis not present

## 2020-09-08 DIAGNOSIS — M9903 Segmental and somatic dysfunction of lumbar region: Secondary | ICD-10-CM | POA: Diagnosis not present

## 2020-09-08 DIAGNOSIS — M9905 Segmental and somatic dysfunction of pelvic region: Secondary | ICD-10-CM | POA: Diagnosis not present

## 2020-09-08 DIAGNOSIS — R519 Headache, unspecified: Secondary | ICD-10-CM | POA: Diagnosis not present

## 2020-09-08 DIAGNOSIS — M4713 Other spondylosis with myelopathy, cervicothoracic region: Secondary | ICD-10-CM | POA: Diagnosis not present

## 2020-09-08 DIAGNOSIS — M47816 Spondylosis without myelopathy or radiculopathy, lumbar region: Secondary | ICD-10-CM | POA: Diagnosis not present

## 2020-09-08 DIAGNOSIS — M9902 Segmental and somatic dysfunction of thoracic region: Secondary | ICD-10-CM | POA: Diagnosis not present

## 2020-09-08 DIAGNOSIS — M9901 Segmental and somatic dysfunction of cervical region: Secondary | ICD-10-CM | POA: Diagnosis not present

## 2020-09-10 ENCOUNTER — Ambulatory Visit (INDEPENDENT_AMBULATORY_CARE_PROVIDER_SITE_OTHER): Payer: Medicare Other | Admitting: Family Medicine

## 2020-09-10 ENCOUNTER — Encounter: Payer: Self-pay | Admitting: Family Medicine

## 2020-09-10 ENCOUNTER — Other Ambulatory Visit: Payer: Self-pay

## 2020-09-10 VITALS — BP 108/70 | HR 101 | Temp 98.1°F | Ht 66.5 in | Wt 199.4 lb

## 2020-09-10 DIAGNOSIS — G4700 Insomnia, unspecified: Secondary | ICD-10-CM

## 2020-09-10 DIAGNOSIS — M47816 Spondylosis without myelopathy or radiculopathy, lumbar region: Secondary | ICD-10-CM | POA: Diagnosis not present

## 2020-09-10 DIAGNOSIS — R519 Headache, unspecified: Secondary | ICD-10-CM | POA: Diagnosis not present

## 2020-09-10 DIAGNOSIS — M9902 Segmental and somatic dysfunction of thoracic region: Secondary | ICD-10-CM | POA: Diagnosis not present

## 2020-09-10 DIAGNOSIS — I10 Essential (primary) hypertension: Secondary | ICD-10-CM

## 2020-09-10 DIAGNOSIS — M4713 Other spondylosis with myelopathy, cervicothoracic region: Secondary | ICD-10-CM | POA: Diagnosis not present

## 2020-09-10 DIAGNOSIS — M9901 Segmental and somatic dysfunction of cervical region: Secondary | ICD-10-CM | POA: Diagnosis not present

## 2020-09-10 NOTE — Patient Instructions (Addendum)
Keep the diet clean and stay active.  Monitor blood pressures at home over the next month. I want your blood pressures <140/90 consistently. If not below this, please restart the losartan and send me a message.   Let us know if you need anything.

## 2020-09-10 NOTE — Progress Notes (Signed)
Chief Complaint  Patient presents with   Insomnia    1 month recheck    Subjective: Patient is a 65 y.o. female here for f/u.  The patient was started on Elavil 25 mg nightly for insomnia.  She reports significant improvement.  She is able to fall asleep and stay asleep now.  No adverse effects.  She was also prescribed Ambien which she has taken 3 times in the last month.  Overall she is sleeping much better.  She does not wish to change anything at this time.  She is not following with a therapist.  The patient has a history of high blood pressure.  She takes losartan 25 mg daily.  She does not monitor her blood pressure at home.  Due to improved sleep, she has been exercising more routinely.  Diet is fair.  No chest pain or shortness of breath.  Past Medical History:  Diagnosis Date   Allergy    ANXIETY 10/18/2007   Qualifier: Diagnosis of  By: Sarajane Jews MD, Ishmael Holter    Autoimmune hepatitis [facility])    Cataract    "beginnings of"   Chronic low back pain    GERD 10/18/2007   Qualifier: Diagnosis of  By: Sherlynn Stalls, CMA, [place]     Hyperlipemia 03/12/2014   HYPERTENSION 10/18/2007   Qualifier: Diagnosis of  By: Sherlynn Stalls, [place], [place]     Insomnia 03/12/2014   Irritable bowel syndrome 10/18/2007   Qualifier: Diagnosis of  By: Sarajane Jews MD, Ishmael Holter    Osteopenia    Recurrent knee pain    R knee hx meniscal tear 2002 with repair   Type 2 diabetes mellitus with hyperglycemia (HCC)     Objective: BP 108/70   Pulse (!) 101   Temp 98.1 F (36.7 C) (Oral)   Ht 5' 6.5" (1.689 m)   Wt 199 lb 6 oz (90.4 kg)   SpO2 96%   BMI 31.70 kg/m  General: Awake, appears stated age HEENT: MMM, EOMi Heart: RRR, no lower extremity edema Lungs: CTAB, no rales, wheezes or rhonchi. No accessory muscle use Psych: Age appropriate judgment and insight, normal affect and mood  Assessment and Plan: Insomnia, unspecified type  Essential hypertension  Chronic, stable.  Continue Elavil 25 mg daily, continue Ambien  controlled release 6.25 mg nightly as needed. Chronic, stable.  Stop losartan 25 mg daily.  Monitor blood pressure at home.  If it stays below 140/90, okay to stay off of it. I will see her in 6 months for physical or as needed. The patient voiced understanding and agreement to the plan.  [place], DO 09/10/20  11:30 AM

## 2020-09-12 ENCOUNTER — Other Ambulatory Visit: Payer: Self-pay | Admitting: Family Medicine

## 2020-09-16 DIAGNOSIS — M9901 Segmental and somatic dysfunction of cervical region: Secondary | ICD-10-CM | POA: Diagnosis not present

## 2020-09-16 DIAGNOSIS — R519 Headache, unspecified: Secondary | ICD-10-CM | POA: Diagnosis not present

## 2020-09-16 DIAGNOSIS — M9903 Segmental and somatic dysfunction of lumbar region: Secondary | ICD-10-CM | POA: Diagnosis not present

## 2020-09-16 DIAGNOSIS — M9905 Segmental and somatic dysfunction of pelvic region: Secondary | ICD-10-CM | POA: Diagnosis not present

## 2020-09-16 DIAGNOSIS — M4713 Other spondylosis with myelopathy, cervicothoracic region: Secondary | ICD-10-CM | POA: Diagnosis not present

## 2020-09-16 DIAGNOSIS — M47816 Spondylosis without myelopathy or radiculopathy, lumbar region: Secondary | ICD-10-CM | POA: Diagnosis not present

## 2020-09-16 DIAGNOSIS — M9902 Segmental and somatic dysfunction of thoracic region: Secondary | ICD-10-CM | POA: Diagnosis not present

## 2020-09-17 DIAGNOSIS — M47816 Spondylosis without myelopathy or radiculopathy, lumbar region: Secondary | ICD-10-CM | POA: Diagnosis not present

## 2020-09-17 DIAGNOSIS — M9901 Segmental and somatic dysfunction of cervical region: Secondary | ICD-10-CM | POA: Diagnosis not present

## 2020-09-17 DIAGNOSIS — R519 Headache, unspecified: Secondary | ICD-10-CM | POA: Diagnosis not present

## 2020-09-17 DIAGNOSIS — M4713 Other spondylosis with myelopathy, cervicothoracic region: Secondary | ICD-10-CM | POA: Diagnosis not present

## 2020-09-17 DIAGNOSIS — M9902 Segmental and somatic dysfunction of thoracic region: Secondary | ICD-10-CM | POA: Diagnosis not present

## 2020-09-22 DIAGNOSIS — M9901 Segmental and somatic dysfunction of cervical region: Secondary | ICD-10-CM | POA: Diagnosis not present

## 2020-09-22 DIAGNOSIS — M9905 Segmental and somatic dysfunction of pelvic region: Secondary | ICD-10-CM | POA: Diagnosis not present

## 2020-09-22 DIAGNOSIS — M47816 Spondylosis without myelopathy or radiculopathy, lumbar region: Secondary | ICD-10-CM | POA: Diagnosis not present

## 2020-09-22 DIAGNOSIS — M4713 Other spondylosis with myelopathy, cervicothoracic region: Secondary | ICD-10-CM | POA: Diagnosis not present

## 2020-09-22 DIAGNOSIS — R519 Headache, unspecified: Secondary | ICD-10-CM | POA: Diagnosis not present

## 2020-09-22 DIAGNOSIS — M9903 Segmental and somatic dysfunction of lumbar region: Secondary | ICD-10-CM | POA: Diagnosis not present

## 2020-09-22 DIAGNOSIS — M9902 Segmental and somatic dysfunction of thoracic region: Secondary | ICD-10-CM | POA: Diagnosis not present

## 2020-09-24 DIAGNOSIS — M4713 Other spondylosis with myelopathy, cervicothoracic region: Secondary | ICD-10-CM | POA: Diagnosis not present

## 2020-09-24 DIAGNOSIS — M9902 Segmental and somatic dysfunction of thoracic region: Secondary | ICD-10-CM | POA: Diagnosis not present

## 2020-09-24 DIAGNOSIS — R519 Headache, unspecified: Secondary | ICD-10-CM | POA: Diagnosis not present

## 2020-09-24 DIAGNOSIS — M47816 Spondylosis without myelopathy or radiculopathy, lumbar region: Secondary | ICD-10-CM | POA: Diagnosis not present

## 2020-09-24 DIAGNOSIS — M9901 Segmental and somatic dysfunction of cervical region: Secondary | ICD-10-CM | POA: Diagnosis not present

## 2020-09-29 DIAGNOSIS — M9903 Segmental and somatic dysfunction of lumbar region: Secondary | ICD-10-CM | POA: Diagnosis not present

## 2020-09-29 DIAGNOSIS — R519 Headache, unspecified: Secondary | ICD-10-CM | POA: Diagnosis not present

## 2020-09-29 DIAGNOSIS — M9901 Segmental and somatic dysfunction of cervical region: Secondary | ICD-10-CM | POA: Diagnosis not present

## 2020-09-29 DIAGNOSIS — M9902 Segmental and somatic dysfunction of thoracic region: Secondary | ICD-10-CM | POA: Diagnosis not present

## 2020-09-29 DIAGNOSIS — M9905 Segmental and somatic dysfunction of pelvic region: Secondary | ICD-10-CM | POA: Diagnosis not present

## 2020-09-29 DIAGNOSIS — M4713 Other spondylosis with myelopathy, cervicothoracic region: Secondary | ICD-10-CM | POA: Diagnosis not present

## 2020-09-29 DIAGNOSIS — M47816 Spondylosis without myelopathy or radiculopathy, lumbar region: Secondary | ICD-10-CM | POA: Diagnosis not present

## 2020-10-01 DIAGNOSIS — M47816 Spondylosis without myelopathy or radiculopathy, lumbar region: Secondary | ICD-10-CM | POA: Diagnosis not present

## 2020-10-01 DIAGNOSIS — M9902 Segmental and somatic dysfunction of thoracic region: Secondary | ICD-10-CM | POA: Diagnosis not present

## 2020-10-01 DIAGNOSIS — R519 Headache, unspecified: Secondary | ICD-10-CM | POA: Diagnosis not present

## 2020-10-01 DIAGNOSIS — M9901 Segmental and somatic dysfunction of cervical region: Secondary | ICD-10-CM | POA: Diagnosis not present

## 2020-10-01 DIAGNOSIS — M4713 Other spondylosis with myelopathy, cervicothoracic region: Secondary | ICD-10-CM | POA: Diagnosis not present

## 2020-10-01 DIAGNOSIS — M9903 Segmental and somatic dysfunction of lumbar region: Secondary | ICD-10-CM | POA: Diagnosis not present

## 2020-10-01 DIAGNOSIS — M9905 Segmental and somatic dysfunction of pelvic region: Secondary | ICD-10-CM | POA: Diagnosis not present

## 2020-10-08 ENCOUNTER — Ambulatory Visit: Payer: Medicare Other | Admitting: Family Medicine

## 2020-10-17 ENCOUNTER — Other Ambulatory Visit: Payer: Self-pay | Admitting: Family Medicine

## 2020-10-17 MED ORDER — LOSARTAN POTASSIUM 50 MG PO TABS: 50.0000 mg | ORAL_TABLET | Freq: Every day | ORAL | 2 refills | Status: DC

## 2020-10-20 ENCOUNTER — Other Ambulatory Visit: Payer: Self-pay | Admitting: Family Medicine

## 2020-10-20 DIAGNOSIS — I10 Essential (primary) hypertension: Secondary | ICD-10-CM

## 2020-10-21 ENCOUNTER — Other Ambulatory Visit: Payer: Self-pay

## 2020-10-21 ENCOUNTER — Encounter: Payer: Self-pay | Admitting: Family Medicine

## 2020-10-21 ENCOUNTER — Ambulatory Visit (INDEPENDENT_AMBULATORY_CARE_PROVIDER_SITE_OTHER): Payer: Medicare Other | Admitting: Family Medicine

## 2020-10-21 VITALS — BP 134/92 | HR 108 | Temp 98.0°F | Ht 66.0 in | Wt 202.2 lb

## 2020-10-21 DIAGNOSIS — R000 Tachycardia, unspecified: Secondary | ICD-10-CM

## 2020-10-21 DIAGNOSIS — I10 Essential (primary) hypertension: Secondary | ICD-10-CM | POA: Diagnosis not present

## 2020-10-21 MED ORDER — METOPROLOL SUCCINATE ER 25 MG PO TB24: 25.0000 mg | ORAL_TABLET | Freq: Every day | ORAL | 3 refills | Status: DC

## 2020-10-21 NOTE — Progress Notes (Signed)
Chief Complaint  Patient presents with   Blood Pressure Check    Subjective Karen Hall is a 66 y.o. female who presents for hypertension follow up. She does monitor home blood pressures. Blood pressures ranging from 110-160's/70-80's on average. She is compliant with medication-losartan 50 mg/d. Patient has these side effects of medication: none She is not adhering to a healthy diet overall. Current exercise: stretching No CP or SOB.    Past Medical History:  Diagnosis Date   Allergy    ANXIETY 10/18/2007   Qualifier: Diagnosis of  By: Sarajane Jews MD, Ishmael Holter    Autoimmune hepatitis [facility])    Cataract    "beginnings of"   Chronic low back pain    GERD 10/18/2007   Qualifier: Diagnosis of  By: Sherlynn Stalls, CMA, [place]     Hyperlipemia 03/12/2014   HYPERTENSION 10/18/2007   Qualifier: Diagnosis of  By: Sherlynn Stalls, [place], [place]     Insomnia 03/12/2014   Irritable bowel syndrome 10/18/2007   Qualifier: Diagnosis of  By: Sarajane Jews MD, Ishmael Holter    Osteopenia    Recurrent knee pain    R knee hx meniscal tear 2002 with repair   Type 2 diabetes mellitus with hyperglycemia (HCC)     Exam BP (!) 134/92   Pulse (!) 108   Temp 98 F (36.7 C) (Oral)   Ht '5\' 6"'$  (1.676 m)   Wt 202 lb 4 oz (91.7 kg)   SpO2 94%   BMI 32.64 kg/m  General:  well developed, well nourished, in no apparent distress Heart: Tachycardic, regular rhythm, no bruits, no LE edema Lungs: clear to auscultation, no accessory muscle use Psych: well oriented with normal range of affect and appropriate judgment/insight  Essential hypertension - Plan: EKG 12-Lead  Tachycardia - Plan: EKG 12-Lead  Chronic, uncontrolled.  Counseled on diet and exercise.  Continue losartan 50 mg daily, add Toprol-XL 25 mg daily.  Continue to monitor blood pressure at home. EKG shows NSR with a rate of 98, normal axis, no interval abnormalities, no ST segment or T wave changes, good R wave progression.  There is no evidence of MAT. F/u in 1 mo. The patient  voiced understanding and agreement to the plan.  [place], DO 10/21/20  11:04 AM

## 2020-10-21 NOTE — Patient Instructions (Signed)
Keep the diet clean and stay active.  Continue to check your blood pressure at home.  Let us know if you need anything.

## 2020-11-03 ENCOUNTER — Other Ambulatory Visit: Payer: Self-pay

## 2020-11-03 ENCOUNTER — Ambulatory Visit (INDEPENDENT_AMBULATORY_CARE_PROVIDER_SITE_OTHER): Payer: Medicare Other | Admitting: Counselor

## 2020-11-03 ENCOUNTER — Ambulatory Visit: Payer: Medicare Other | Admitting: Psychology

## 2020-11-03 ENCOUNTER — Encounter: Payer: Self-pay | Admitting: Counselor

## 2020-11-03 DIAGNOSIS — R413 Other amnesia: Secondary | ICD-10-CM

## 2020-11-03 DIAGNOSIS — R4189 Other symptoms and signs involving cognitive functions and awareness: Secondary | ICD-10-CM

## 2020-11-03 DIAGNOSIS — G3184 Mild cognitive impairment, so stated: Secondary | ICD-10-CM

## 2020-11-03 DIAGNOSIS — R4789 Other speech disturbances: Secondary | ICD-10-CM

## 2020-11-03 NOTE — Progress Notes (Signed)
[facility]  Patient Name: Karen Hall MRN: [id number] Date of Birth: [birth year] Age: 65 y.o. Education: 12 years  Referral Circumstances and Background Information  Karen Hall is a 65 y.o., right-hand dominant, widowed woman with a history of HLD, HTN, T2DM, and memory and thinking problems over the past 6 to 8 months. She is a patient of Dr. Nani Ravens with [facility] at [facility].   On interview, the patient reported that she has been having cognitive difficulties over the past 6 to 8 months. Her spontaneous complaints involve difficulties with word finding, problems recalling things that people tell her, and it sounds as though the symptoms are fairly subtle in the grand scheme of things. She isn't sure if anybody else has noticed her problems or not. Sometimes, she will gets so upset with herself when she is having cognitive problems that people will notice (this is particularly true with her language problems). On specific review of symptoms she acknowledged problems with attention (she says these are longstanding difficulties), with language, and with memory. She is denying much in the way of problems with judgment and problem solving, with orientation to time, or with visuospatial functioning. She does notice that she has greater difficulties when she does not sleep well. She stated that she has a "really hard time" shutting her mind off and going to sleep. She also reported that she awakens frequently throughout the night and that interrupts her sleep. She wasn't clear regarding how long she sleeps but it sounds like her sleep efficiency is poor and she can be in bed many hours but only get a few hours of sleep. She has vivid dreams but does not act out her dreams. With respect to mood, she reported that she is "one of these people that overanalyzes everything." She has been on medication for anxiety for sometime although  she doesn't have the subjective experience of much anxiety, she discussed her problems in a very external, objectified fashion. She reported that her energy is low this week, although she has had her blood pressure medication changed and she thinks that may be it. Normally she is a fairly energetic person. She reported that she does not well with eating a healthy diet, although her weight is stable. She does not exercise and is fairly sedentary.   The patient largely denied any functional difficulties or problems day-to-day as a result of her memory and thinking problems. They are more an annoyance. She lives independently in a town home and has no difficulties managing the things that she needs to. She has no difficulties driving and denied any accidents or getting lost. 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 She is independent with managing her bills. She is able to manage her finances and doesn't have problems with late payments. She manages her own medications. She "hates cooking" so most of the food she eats is ordered out. She has some hobbies and past times, she is involved with a paranormal investigation group that is more a social outlet than anything else. She watches TV. She has no difficulties with grocery shopping etc. She stated that she tends to procrastinate with cleaning her house and other things that she "should" do.   Past Medical History and Review of Relevant Studies   Patient Active Problem List   Diagnosis Date Noted   Type 2 diabetes mellitus with hyperglycemia ([place])  Patellofemoral pain syndrome of right knee 01/25/2020   Hiatal hernia with GERD 06/27/2019   Chronic cough    Chronic diastolic CHF (congestive heart failure) ([place]) 06/17/2019   Shortness of breath 06/16/2019   Type 2 diabetes mellitus without complication ([place]) [id number]   Obesity 06/16/2019   Depression 06/16/2019   AKI (acute Hall injury)  ([place]) 06/16/2019   Upper airway cough syndrome 01/23/2019   Osteopenia 01/13/2018   Diabetes mellitus type 2 in obese ([place]) 12/19/2017   Diabetes mellitus ([place]) 02/24/2016   [place] 02/24/2016   Chronic bronchitis ([place]) 07/21/2015   Hyperlipemia 03/12/2014   Insomnia 03/12/2014   Anxiety state 10/18/2007   Essential hypertension 10/18/2007   Allergic rhinitis 10/18/2007   GERD 10/18/2007   IRRITABLE BOWEL SYNDROME 10/18/2007   HEADACHE 10/18/2007    Review of Neuroimaging and Relevant Medical History: Patient has a head CT from 08/15/2020 that was interpreted as normal as per radiology, I independently reviewed the images and agree.   Current Outpatient Medications  Medication Sig Dispense Refill   amitriptyline (ELAVIL) 25 MG tablet TAKE 1 TABLET BY MOUTH EVERYDAY AT BEDTIME 90 tablet 1   atorvastatin (LIPITOR) 10 MG tablet TAKE 1 TABLET BY MOUTH EVERY DAY 90 tablet 0   Cholecalciferol (VITAMIN D3) 125 MCG (5000 UT) CAPS Take 1 capsule by mouth daily.     citalopram (CELEXA) 20 MG tablet Take 1 tablet (20 mg total) by mouth daily. 90 tablet 1   Homeopathic Products (ZICAM ALLERGY RELIEF NA) Place 1 application into the nose 2 (two) times daily as needed (congestion/allergies).      losartan (COZAAR) 50 MG tablet Take 1 tablet (50 mg total) by mouth daily. 30 tablet 2   Menthol, Topical Analgesic, (BIOFREEZE EX) Apply 1 application topically at bedtime as needed (pain).     metoprolol succinate (TOPROL-XL) 25 MG 24 hr tablet Take 1 tablet (25 mg total) by mouth daily. 30 tablet 3   Polyethyl Glycol-Propyl Glycol (SYSTANE) 0.4-0.3 % SOLN Place 1 drop into both eyes daily as needed (dry eyes).     simethicone (GAS-X) 80 MG chewable tablet Chew 1 tablet (80 mg total) by mouth 4 (four) times daily as needed (gas/bloating). 60 tablet 0   zolpidem (AMBIEN CR) 6.25 MG CR tablet 1 tablet at bedtime as needed and only for severe sleep disorder. Use as little as possible. Needs office visit prior  to further refills. 30 tablet 2   No current facility-administered medications for this visit.   Family History  Problem Relation Age of Onset   Cancer Mother        melanoma   Hyperlipidemia Mother    Breast cancer Mother    Liver cancer Father    Cancer Maternal Grandfather        unknown type   Heart disease Paternal Grandfather    Diabetes Maternal Aunt    Diabetes Sister    Lung disease Neg Hx    Colon cancer Neg Hx    Esophageal cancer Neg Hx    Rectal cancer Neg Hx    Stomach cancer Neg Hx    There is a family history of dementia. The patient had a paternal aunt with Alzheimer's. It sounds like she has had a fair amount of exposure to dementia via friends. There is no  family history of psychiatric illness.  Psychosocial History  Developmental, Educational and Employment History: The patient reported that she had a decent childhood and denied any abuse or neglect. She grew  up in [place], [place]. She reported that she earned primarily B's and C's in school and denied ever being held back. It sounds like she was not particularly interested in school. The patient reported that she has always wondered if she had ADHD, because she doesn't like to sit still and is a bit hyperactive. She reported that she did not focus very well in school and she did get in trouble occasionally, when she was in the lower grades, for "talking too much or too loud." She denied that any of her teachers commented much on her difficulties when she was younger. She denied having behavior problems. She described herself as very compliant with the rules. 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 The patient retired in 2018, her department was closed and she decided to retire.   Psychiatric History: The patient had some involvement in counseling when her husband was sick with colon cancer. This was about 2.5 years ago. She  denied any history of mental health treatment before or since. She has been on antianxiety medication for nearly a decade. She had been on Celexa previously around the time that her husband was sick but weaned herself off of it after 2004, when her husband passed. She then got back on it when she was having a hard time keeping a job in 2011-2012 while living in [place]. She thinks that it does help her mood and anxiety and feels uncomfortable without it.   Substance Use History: The patient doesn't drink regularly, she doesn't smoke or use any drugs.   Relationship History and Living Cimcumstances: The patient was previously married, they were married for 18 years, her husband passed in 2004. She has no children of her own. Her husband was older and had several children, she considers her deceased husband's step daughter's children her grandchildren, although she doesn't see them often.   Mental Status and Behavioral Observations  Sensorium/Arousal: The patient's level of arousal was awake and alert. Hearing and vision were adequate for testing purposes. Orientation: The patient was fully oriented to person, place, time and situation.  Appearance: Dressed in appropriate, casual clothing with reasonable grooming and hygiene.  Behavior: Pleasant, appropriate, a bit intense at times during interview. During testing patient was impulsive on certain tasks (visual discrimination) as per technician.  Speech/language: Normal in rhythm, volume and prosody. Rate was a bit fast.  Gait/Posture: Appeared normal on observation of ambulation within the clinic Movement: No rest tremor, bradykinesia, hypokinesia observed.  Social Comportment: Appropriate within social norms Mood: "Pretty good"  Affect: Euthymic with an undercurrent of anxiety Thought process/content: Thought process was logical, linear, goal-directed. She was able to readily provide a detailed history and personal timeline. Thought content was  appropriate to the topics discussed Safety: No thoughts of harming self or others identified at this encounter Insight: Karen Hall Cognitive Assessment  11/03/2020  Visuospatial/ Executive (0/5) 4  Naming (0/3) 3  Attention: Read list of digits (0/2) 2  Attention: Read list of letters (0/1) 1  Attention: Serial 7 subtraction starting at 100 (0/3) 2  Language: Repeat phrase (0/2) 2  Language : Fluency (0/1) 0  Abstraction (0/2) 1  Delayed Recall (0/5) 2  Orientation (0/6) 6  Total 23  Adjusted Score (based on education) 24   Test Procedures  Wide Range Achievement Test - 4             Word  Reading [employer] Intellectual Screening Test Neuropsychological Assessment Battery  Memory Module  Naming  Digit Span  Numbers & Letters A - D  Visual Discrimination  Figure Copy The Dot Counting Test A Random Letter Test Controlled Oral Word Association (F-A-S) Semantic Fluency (Animals) Trail Making Test A & B Complex Ideational Material Modified [place] Card Sorting Test Geriatric Depression Scale - Short Form Patient Health Questionnaire - 9 GAD - [place] was seen for a psychiatric diagnostic evaluation and neuropsychological testing. She is a pleasant, 65 year old, right-hand dominant widowed woman with a history of word finding problems and minor memory issues over the past 8 months or so. She has no functional difficulties as a result of her problems. She is aware that she is not sleeping well and that her difficulties are worse when she is not well rested. She also has a history of some anxiety. She is screening in the normal range today on the MoCA considering age and education. Full and complete note with impressions, recommendations, and interpretation of test data to follow.   Viviano Simas Karen Kindred, PsyD, [place] Clinical Neuropsychologist  Informed Consent  Risks and benefits of the evaluation were discussed with the patient prior to all testing procedures. I  conducted a clinical interview and neuropsychological testing (at least two tests) with Karen Hall and Karen Hall, B.S. (Technician) administered additional test procedures. The patient was able to tolerate the testing procedures and the patient (and/or family if applicable) is likely to benefit from further follow up to receive the diagnosis and treatment recommendations, which will be rendered at the next encounter.

## 2020-11-03 NOTE — Progress Notes (Signed)
   Psychometrist Note   Cognitive testing was administered to Karen Hall by Milana Kidney, B.S. (Technician) under the supervision of Alphonzo Severance, Psy.D., ABN. Karen Hall was able to tolerate all test procedures. Dr. Nicole Kindred met with the patient as needed to manage any emotional reactions to the testing procedures. Rest breaks were offered.    The battery of tests administered was selected by Dr. Nicole Kindred with consideration to the patient's current level of functioning, the nature of her symptoms, emotional and behavioral responses during the interview, level of literacy, observed level of motivation/effort, and the nature of the referral question. This battery was communicated to the psychometrist. Communication between Dr. Nicole Kindred and the psychometrist was ongoing throughout the evaluation and Dr. Nicole Kindred was immediately accessible at all times. Dr. Nicole Kindred provided supervision to the technician on the date of this service, to the extent necessary to assure the quality of all services provided.    Karen Hall will return in approximately one week for an interactive feedback session with Dr. Nicole Kindred, at which time test performance, clinical impressions, and treatment recommendations will be reviewed in detail. The patient understands she can contact our office should she require our assistance before this time.   A total of 115 minutes of billable time were spent with Karen Hall by the technician, including test administration and scoring time. Billing for these services is reflected in Dr. Les Pou note.   This note reflects time spent with the psychometrician and does not include test scores, clinical history, or any interpretations made by Dr. Nicole Kindred. The full report will follow in a separate note.

## 2020-11-04 NOTE — Progress Notes (Signed)
[facility]  Patient Name: Karen Hall MRN: [id number] Date of Birth: 12-Sep-1955 Age: 65 y.o. Education: 12 years  Measurement properties of test scores: IQ, Index, and Standard Scores (SS): Mean = 100; Standard Deviation = 15 Scaled Scores (Ss): Mean = 10; Standard Deviation = 3 Z scores (Z): Mean = 0; Standard Deviation = 1 T scores (T); Mean = 50; Standard Deviation = 10  TEST SCORES:    Note: This summary of test scores accompanies the interpretive report and should not be interpreted by unqualified individuals or in isolation without reference to the report. Test scores are relative to age, gender, and educational history as available and appropriate.   Performance Validity        "A" Random Letter Test Raw  Descriptor      Errors 3 Marginal  The Dot Counting Test: 8 Within Expectation      Embedded Measures: Raw  Descriptor      NAB Effort Index 0 Within Expectation      Mental Status Screening     Total Score Descriptor  MoCA 24 Normal      Expected Functioning        Wide Range Achievement Test: Standard/Scaled Score Percentile      Word Reading 109 73      [employer] Intellectual Screening Test Standard/T-score Percentile      Guess [id number]      Odd Item Out 62 88  RIST Index 115 84      Attention/Processing Speed        Neuropsychological Assessment Battery (Attention Module, Form 1): Scaled/T-score Percentile      Digits Forward 59 82      Digits Backwards 51 54      Numbers & Letters A Speed 53 62      Numbers & Letters A Accuracy 37 10      Numbers & Letters A Efficiency 50 50      Numbers & Letters B Efficiency 43 25      Numbers & Letters C Efficiency 48 42      Numbers & Letters D Efficiency 51 54      Numbers & Letters D Disruption 51 54      Language        Neuropsychological Assessment Battery (Language Module, Form 1): T-score Percentile      Naming   (31) 58 79      Verbal Fluency:  T Score  Percentile      Controlled Oral Word Association (F-A-S) 50 50      Semantic Fluency (Animals) 65 93      Memory:        Neuropsychological Assessment Battery (Memory Module, Form 1): T-score/Standard Score Percentile  Memory Index (MEM): 103 58      List Learning           List A Immediate Recall   (5 , 9 , 8) 51 54         List B Immediate Recall   (5) 57 75         List A Short Delayed Recall   (6) 46 34         List A Long Delayed Recall   (6) 47 38         List A Percent Retention   (100 %) --- 54         List A Long Delayed Yes/No Recognition Hits   (11) ---  54         List A Long Delayed Yes/No Recognition False Alarms   (1) --- 75         List A Recognition Discriminability Index --- 75      Shape Learning           Immediate Recognition   (5 , 4 , 8) 57 75         Delayed Recognition   (6) 55 69         Percent Retention   (75 %) --- 25         Delayed Forced-Choice Recognition Hits   (8) --- 58         Delayed Forced-Choice Recognition False Alarms   (1) --- 50         Delayed Forced-Choice Recognition Discriminability --- 58      Story Learning           Immediate Recall   (25, 37) 51 54         Delayed Recall   (36) 59 82         Percent Retention   (97 %) --- 66      Daily Living Memory            Immediate Recall   (25, 17) 52 58          Delayed Recall   (9, 5) 51 54          Percent Retention (88 %) --- 50          Recognition Hits   (10) --- 84      Visuospatial/Constructional Functioning        Neuropsychological Assessment Battery (Visuospatial Module, Form 1): T-score Percentile      Figure Copy 67 96      Visual Discrimination 38 12      Executive Functioning        Modified [place] Card Sorting Test (MWCST): Standard/T-Score Percentile      Number of Categories Correct 60 84      Number of Perseverative Errors 67 96      Number of Total Errors 70 98      Percent Perseverative Errors 65 93  Executive Function Composite 122 93      Trail Making  Test: T-Score Percentile      Part A 49 46      Part B 65 93      [place] Diagnostic Aphasia Exam: Raw Score Scaled Score      Complex Ideational Material 12 12      Clock Drawing Raw Score Descriptor      Command 10 WNL      Rating Scales         Raw Score Descriptor  Patient Health Questionnaire - 9 8 Mild  GAD-7 7 Mild      Clinical Dementia Rating Raw Score Descriptor      Sum of Boxes 0.5 Mild Cognitive Impairment      Global Score 0.5 MCI       Raw Score Descriptor  Geriatric Depression Scale - Short Form 3 Negative    Deeksha Cotrell V. Nicole Kindred PsyD, [place] Clinical Neuropsychologist

## 2020-11-07 NOTE — Progress Notes (Signed)
[facility]  Patient Name: Karen Hall MRN: [id number] Date of Birth: 22-Jan-1956 Age: 65 y.o. Education: 12 years  Clinical Impressions  Karen Hall is a 65 y.o., right-hand dominant, widowed woman with a history of HLD, HTN, DM2 and memory and thinking problems over the past 6 to 8 months. The main symptoms she notices are minor difficulties with memory and word finding problems. She is not materially impaired in any area as a result of her cognitive difficulties. She has a CT head that showed normal brain volume and morphology for age (08/15/2020). With respect to the present evaluation, this is a normal neuropsychological study. There are no patterns of performance concerning for a decline in any cognitive domain. Performance was very good on challenging measures of executive abilities involving concept formation, cognitive flexibility, and other higher order cognitive skills. Memory fell at an average level overall. The patient reported mild levels of symptoms associated with depression and anxiety, although these could potentially warrant clinical attention. The patient's day-to-day symptoms may be on the basis of fatigue, distraction related to anxiety, and poor sleep quantity/quality among other factors. Certainly no concerning findings for a clinically evident progressive condition at this point in time.    Diagnostic Impressions: Anxiety disorder unspecified Cognitive complaints with normal neuropsychological exam  Recommendations to be discussed with patient  Your performance and presentation on neuropsychological assessment were consistent with good performance in all areas. Specifically, you demonstrated high average overall levels of ability (with some suggestion of bettervisual as compared to verbal abilities) and findings within expectation of that standard in all areas assessed. Your memory fell at an overall average level and you also  did extremely well on several challenging measures of executive abilities involving cognitive flexibility and other lower order cognitive skills. Accordingly, you do not warrant a cognitive diagnosis.  You did report a mild yet potentially clinically significant amount of anxiety. It also sounds like you have some possible anxiety symptoms interfering with your sleep (e.g., not being able to turn your mind off). I recommend that you consider the possibility of treatment, although I do not feel it is mandatory. I will provide you with some sleep hygiene recommendations that may help you get a better night sleep, which could certainly improve the subjective difficulties that you noticed with memory and thinking.  There are few things as disruptive to brain functioning as not getting a good night's sleep. For sleep, I recommend against using medications, which can have lingering sedating effects on the brain and rob your brain of restful REM sleep. Instead, consider trying some of the following sleep hygiene recommendations. They may not work at once and may take effort, but the effort you spend is likely to be rewarded with better sleep eventually:  Stick to a sleep schedule of the same bedtime and wake time even on the weekends, which can help to regulate your body's internal clock so that you fall asleep and stay asleep.  Practice a relaxing bedtime ritual (conducted away from bright lights) which will help separate your sleep from stimulating activities and prepare your body to fall asleep when you go to bed.  Avoid naps, especially in the afternoon.  Evaluate your room and create conditions that will promote sleep such as keeping it cool (between 60 - 67 degrees), quiet, and free from any lights. Consider using blackout curtains, a "white noise" generator, or fan that will help mask any noises that might prevent you from going to  sleep or awaken you during the night.  Sleep on a comfortable mattress and  pillows.  Avoid bright light in the evening and excessive use of portable electronic devices right before bed that may contain light frequencies that can contribute to sleep problems.  Avoid alcohol, cigarettes, or heavy meals in the evening. If you must eat, consume a light snack 45 minutes before bed.  Use your bed only for sleep to strengthen the association between your bed and sleep.  If you can't go to sleep within 30 minutes, go into another room and do something relaxing until you feel tired. Then, come back and try to go to sleep again for 30 minutes and repeat until sleep is achieved.  Some people find over the counter melatonin to be helpful for sleep, which you could discuss with a pharmacist or prescribing provider.   Test Findings  Test scores are summarized in additional documentation associated with this encounter. Test scores are relative to age, gender, and educational history as available and appropriate. There were no concerns about performance validity as all findings fell within normal expectations.   General Intellectual Functioning/Achievement:  Performance on single word reading was towards the upper most extreme of the average range. Performance on the RIST index was high average, with an average score on the more verbally oriented subtest and high average score on the more visually oriented subtest. The RIST index was used a basis of comparison for the patient's cognitive test performance.  Attention and Processing Efficiency: Indicators of attention/processing efficiency suggested normal performance overall. Digit repetition forward was high average and digit repetition backward was average. Performance on timed number and letter cancellation tests, some of which also involve mental math, was low average to average. She actually did the best on the last and most complicated of these measures, which involves alternating attention between cancellation and mental calculation.  Inspection of specific subtest scores shows that her response style favored accuracy over speed.  Language: Visual object confrontation naming was errorless. She performed well on timed fluency measures, with an average score in the phonemic modality and a superior score in the semantic modality.  Visuospatial Function: Performance on visuospatial/constructional indicators was good for the most part, although her score was low average on a measure of fine visual discrimination and attention to detail. As per technician, she was a bit impulsive on this task, so this weaker score may reflect her test taking approach. She performed almost without error on figure copy, generating a superior range score.  Learning and Memory: Performance on measures of learning and memory fell at an average level overall. Adequate acquisition and retention of information across time was demonstrated  in both the visual and verbal domains.  In the verbal realm, she recalled 5, 9, and 8 words of a 12-item word list across 3 learning trials, which is average. She retained 6 of those words on short delayed recall and all 6 of those words on long delayed recall, which are average scores for both trials. Recognition discriminability was high average. Memory for a short story was high average on immediate recall and high average on delayed recall with comparable average range recognition. Memory for brief daily living information was average on immediate recall and delayed recall, with high average recognition of that information.  In the visual realm, Ms. Senat learned 5, 4, and 8 designs of a possible 9 designs that are difficult to verbally encode, which is high average. Her retention of information across time  was average and her delayed recognition for these designs was also average  Executive Functions: Performance on executive indicators was very good, with a superior range score on the executive function composition of the  modified [place] card sorting test. She demonstrated a very good high average score for categories correct and a superior score for perseverative errors.  Alternating sequencing of numbers and letters of the alphabet was superior. Generation of words in response to the letters F-A-S was average. She performed adequately on reasoning with verbal information and on clock drawing, with intact face, number placement, and hand placement  Rating Scale(s): Ms. Tarantino reported mild levels of symptoms associated with depression and anxiety on self rating scales. The findings show mainly somatic symptoms, such as difficulty sleeping and low energy, although she did report some diminished interest in things as well. I was able to rate a CDR for her, and would place her sum of boxes score at a 0.5, which is not even a mild cognitive impairment level problem.  Viviano Simas Nicole Kindred, PsyD, ABN Clinical Neuropsychologist  Coding and Compliance  Billing below reflects technician time, my direct face-to-face time with the patient, time spent in test administration, and time spent in professional activities including but not limited to: neuropsychological test interpretation, integration of neuropsychological test data with clinical history, report preparation, treatment planning, care coordination, and review of diagnostically pertinent medical history or studies.   Services associated with this encounter: Clinical Interview [phone]) plus 115 minutes (96132/96133; Neuropsychological Evaluation by Professional)  24 minutes (96136/96137; Test Administration by Professional) 115 minutes (96138/96139; Neuropsychological Testing by Technician)

## 2020-11-10 ENCOUNTER — Other Ambulatory Visit: Payer: Self-pay

## 2020-11-10 ENCOUNTER — Encounter: Payer: Self-pay | Admitting: Counselor

## 2020-11-10 ENCOUNTER — Ambulatory Visit (INDEPENDENT_AMBULATORY_CARE_PROVIDER_SITE_OTHER): Payer: Medicare Other | Admitting: Counselor

## 2020-11-10 DIAGNOSIS — F419 Anxiety disorder, unspecified: Secondary | ICD-10-CM

## 2020-11-10 DIAGNOSIS — R419 Unspecified symptoms and signs involving cognitive functions and awareness: Secondary | ICD-10-CM

## 2020-11-10 NOTE — Progress Notes (Signed)
NEUROPSYCHOLOGY FEEDBACK NOTE [facility]  Feedback Note: I met with Karen Hall to review the findings resulting from her neuropsychological evaluation. Since the last appointment, she has been well. She spent some time over the weekend doing her paranormal investigations. Time was spent reviewing the impressions and recommendations that are detailed in the evaluation report. We discussed impression of normal cognitive performance, as reflected in the patient instructions. I discussed possible contributors to cognitive problems including sleep problems, anxiety, sensitivity to normal age-related changes. I took time to explain the findings and answer all the patient's questions. I encouraged Karen Hall to contact me should she have any further questions or if further follow up is desired.   Current Medications and Medical History   Current Outpatient Medications  Medication Sig Dispense Refill   amitriptyline (ELAVIL) 25 MG tablet TAKE 1 TABLET BY MOUTH EVERYDAY AT BEDTIME 90 tablet 1   atorvastatin (LIPITOR) 10 MG tablet TAKE 1 TABLET BY MOUTH EVERY DAY 90 tablet 0   Cholecalciferol (VITAMIN D3) 125 MCG (5000 UT) CAPS Take 1 capsule by mouth daily.     citalopram (CELEXA) 20 MG tablet Take 1 tablet (20 mg total) by mouth daily. 90 tablet 1   Homeopathic Products (ZICAM ALLERGY RELIEF NA) Place 1 application into the nose 2 (two) times daily as needed (congestion/allergies).      losartan (COZAAR) 50 MG tablet Take 1 tablet (50 mg total) by mouth daily. 30 tablet 2   Menthol, Topical Analgesic, (BIOFREEZE EX) Apply 1 application topically at bedtime as needed (pain).     metoprolol succinate (TOPROL-XL) 25 MG 24 hr tablet Take 1 tablet (25 mg total) by mouth daily. 30 tablet 3   Polyethyl Glycol-Propyl Glycol (SYSTANE) 0.4-0.3 % SOLN Place 1 drop into both eyes daily as needed (dry eyes).     simethicone (GAS-X) 80 MG chewable tablet Chew 1 tablet (80 mg total) by mouth 4 (four)  times daily as needed (gas/bloating). 60 tablet 0   zolpidem (AMBIEN CR) 6.25 MG CR tablet 1 tablet at bedtime as needed and only for severe sleep disorder. Use as little as possible. Needs office visit prior to further refills. 30 tablet 2   No current facility-administered medications for this visit.    Patient Active Problem List   Diagnosis Date Noted   Type 2 diabetes mellitus with hyperglycemia ([place])    Patellofemoral pain syndrome of right knee 01/25/2020   Hiatal hernia with GERD 06/27/2019   Chronic cough    Chronic diastolic CHF (congestive heart failure) ([place]) 06/17/2019   Shortness of breath 06/16/2019   Type 2 diabetes mellitus without complication ([place]) 82/64/1583   Obesity 06/16/2019   Depression 06/16/2019   AKI (acute kidney injury) ([place]) 06/16/2019   Upper airway cough syndrome 01/23/2019   Osteopenia 01/13/2018   Diabetes mellitus type 2 in obese ([place]) 12/19/2017   Diabetes mellitus ([place]) 02/24/2016   [place] 02/24/2016   Chronic bronchitis ([place]) 07/21/2015   Hyperlipemia 03/12/2014   Insomnia 03/12/2014   Anxiety state 10/18/2007   Essential hypertension 10/18/2007   Allergic rhinitis 10/18/2007   GERD 10/18/2007   IRRITABLE BOWEL SYNDROME 10/18/2007   HEADACHE 10/18/2007    Mental Status and Behavioral Observations  Karen Hall presented on time to the present encounter and was alert and generally oriented. Speech was normal in rate, rhythm, volume, and prosody. Self-reported mood was "good" and affect was anxious. Thought process was logical and goal oriented and thought content was appropriate to  the topics discused. There were no safety concerns identified at today's encounter, such as thoughts of harming self or others.   Plan  Feedback provided regarding the patient's neuropsychological evaluation. She is doing very well objectively speaking, and she may improve her subjective functioning by treating her underlying sleep issues and perhaps also  anxiety. Discussed with her efficacious treatments for insomnia, such as CBTi, which she can consider. Karen Hall was encouraged to contact me if any questions arise or if further follow up is desired.   Karen Hall Karen Kindred, PsyD, ABN Clinical Neuropsychologist  Service(s) Provided at This Encounter: 30 minutes [phone]; Psychotherapy with patient/family)

## 2020-11-10 NOTE — Patient Instructions (Addendum)
Your performance and presentation on neuropsychological assessment were consistent with good performance in all areas. Specifically, you demonstrated high average overall levels of ability (with some suggestion of bettervisual as compared to verbal abilities) and findings within expectation of that standard in all areas assessed. Your memory fell at an overall average level and you also did extremely well on several challenging measures of executive abilities involving cognitive flexibility and other lower order cognitive skills. Accordingly, you do not warrant a cognitive diagnosis.  You did report a mild yet potentially clinically significant amount of anxiety. It also sounds like you have some possible anxiety symptoms interfering with your sleep (e.g., not being able to turn your mind off). I recommend that you consider the possibility of treatment, although I do not feel it is mandatory. I will provide you with some sleep hygiene recommendations that may help you get a better night sleep, which could certainly improve the subjective difficulties that you noticed with memory and thinking.   There are few things as disruptive to brain functioning as not getting a good night's sleep. For sleep, I recommend against using medications, which can have lingering sedating effects on the brain and rob your brain of restful REM sleep. Instead, consider trying some of the following sleep hygiene recommendations. They may not work at once and may take effort, but the effort you spend is likely to be rewarded with better sleep eventually:   Stick to a sleep schedule of the same bedtime and wake time even on the weekends, which can help to regulate your body's internal clock so that you fall asleep and stay asleep.  Practice a relaxing bedtime ritual (conducted away from bright lights) which will help separate your sleep from stimulating activities and prepare your body to fall asleep when you go to bed.  Avoid naps,  especially in the afternoon.  Evaluate your room and create conditions that will promote sleep such as keeping it cool (between 60 - 67 degrees), quiet, and free from any lights. Consider using blackout curtains, a "white noise" generator, or fan that will help mask any noises that might prevent you from going to sleep or awaken you during the night.  Sleep on a comfortable mattress and pillows.  Avoid bright light in the evening and excessive use of portable electronic devices right before bed that may contain light frequencies that can contribute to sleep problems.  Avoid alcohol, cigarettes, or heavy meals in the evening. If you must eat, consume a light snack 45 minutes before bed.  Use your bed only for sleep to strengthen the association between your bed and sleep.  If you can't go to sleep within 30 minutes, go into another room and do something relaxing until you feel tired. Then, come back and try to go to sleep again for 30 minutes and repeat until sleep is achieved.  Some people find over the counter melatonin to be helpful for sleep, which you could discuss with a pharmacist or prescribing provider.   We also discussed cognitive behavioral therapy for insomnia, CBTi, which is a formal behavioral treatment for insomnia that involves monitoring your sleeping patterns and eventually changing them using things such as sleep compression and the like.

## 2020-11-14 LAB — IFOBT (OCCULT BLOOD): IFOBT: NEGATIVE

## 2020-11-21 ENCOUNTER — Other Ambulatory Visit: Payer: Self-pay

## 2020-11-21 ENCOUNTER — Ambulatory Visit (INDEPENDENT_AMBULATORY_CARE_PROVIDER_SITE_OTHER): Payer: Medicare Other | Admitting: Family Medicine

## 2020-11-21 ENCOUNTER — Encounter: Payer: Self-pay | Admitting: Family Medicine

## 2020-11-21 VITALS — BP 130/82 | HR 107 | Temp 98.2°F | Ht 66.0 in | Wt 203.4 lb

## 2020-11-21 DIAGNOSIS — I10 Essential (primary) hypertension: Secondary | ICD-10-CM | POA: Diagnosis not present

## 2020-11-21 LAB — FECAL OCCULT BLOOD, GUAIAC: Fecal Occult Blood: NEGATIVE

## 2020-11-21 MED ORDER — ATORVASTATIN CALCIUM 10 MG PO TABS: 10.0000 mg | ORAL_TABLET | Freq: Every day | ORAL | 0 refills | Status: DC

## 2020-11-21 MED ORDER — LOSARTAN POTASSIUM 50 MG PO TABS: 100.0000 mg | ORAL_TABLET | Freq: Every day | ORAL | 2 refills | Status: DC

## 2020-11-21 NOTE — Patient Instructions (Addendum)
Claritin (loratadine), Allegra (fexofenadine), Zyrtec (cetirizine) which is also equivalent to Xyzal (levocetirizine); these are listed in order from weakest to strongest. Generic, and therefore cheaper, options are in the parentheses.   Flonase (fluticasone); nasal spray that is over the counter. 2 sprays each nostril, once daily. Aim towards the same side eye when you spray.  There are available OTC, and the generic versions, which may be cheaper, are in parentheses. Show this to a pharmacist if you have trouble finding any of these items.  Keep the diet clean and stay active.  Continue to monitor blood pressure at home.  Go back to losartan 50 mg daily for 10 days then start taking 100 mg daily.   Stop the metoprolol.   Let us know if you need anything.

## 2020-11-21 NOTE — Progress Notes (Signed)
Chief Complaint  Patient presents with   Follow-up    Blood pressure     Subjective Karen Hall is a 65 y.o. female who presents for hypertension follow up. She does monitor home blood pressures. Blood pressures ranging from 120-140's/60-80's on average. She is compliant with medications- Toprol XL 25 mg/d Patient has these side effects of medication: feelings of going to pass out She is not adhering to a healthy diet overall. Current exercise: none No CP or new SOB.    Past Medical History:  Diagnosis Date   Allergy    ANXIETY 10/18/2007   Qualifier: Diagnosis of  By: Sarajane Jews MD, Ishmael Holter    Autoimmune hepatitis [facility])    Cataract    "beginnings of"   Chronic low back pain    GERD 10/18/2007   Qualifier: Diagnosis of  By: Sherlynn Stalls, CMA, [place]     Hyperlipemia 03/12/2014   HYPERTENSION 10/18/2007   Qualifier: Diagnosis of  By: Sherlynn Stalls, [place], [place]     Insomnia 03/12/2014   Irritable bowel syndrome 10/18/2007   Qualifier: Diagnosis of  By: Sarajane Jews MD, Ishmael Holter    Osteopenia    Recurrent knee pain    R knee hx meniscal tear 2002 with repair   Type 2 diabetes mellitus with hyperglycemia (H[facility])     Exam BP 130/82   Pulse (!) 107   Temp 98.2 F (36.8 C) (Oral)   Ht '5\' 6"'$  (1.676 m)   Wt 203 lb 6 oz (92.3 kg)   SpO2 95%   BMI 32.83 kg/m  General:  well developed, well nourished, in no apparent distress Heart: Reg rhythm, tachycardia, no bruits, no LE edema Lungs: clear to auscultation, no accessory muscle use Psych: well oriented with normal range of affect and appropriate judgment/insight  Essential hypertension - Plan: losartan (COZAAR) 50 MG tablet  Chronic, uncontrolled. Go back on losartan for 10 d then increase to 100 mg/d. Stop Toprol. Monitor BP at home. Counseled on diet and exercise. F/u in 3 weeks. The patient voiced understanding and agreement to the plan.  [place], DO 11/21/20  3:29 PM

## 2020-11-28 ENCOUNTER — Other Ambulatory Visit: Payer: Self-pay | Admitting: Family Medicine

## 2020-12-03 ENCOUNTER — Encounter: Payer: Self-pay | Admitting: Family Medicine

## 2020-12-12 ENCOUNTER — Other Ambulatory Visit: Payer: Self-pay | Admitting: Family Medicine

## 2020-12-26 ENCOUNTER — Other Ambulatory Visit: Payer: Self-pay | Admitting: Family Medicine

## 2020-12-26 DIAGNOSIS — Z1231 Encounter for screening mammogram for malignant neoplasm of breast: Secondary | ICD-10-CM

## 2020-12-29 ENCOUNTER — Other Ambulatory Visit: Payer: Self-pay | Admitting: Family Medicine

## 2020-12-29 NOTE — Progress Notes (Unsigned)
a 

## 2020-12-30 ENCOUNTER — Ambulatory Visit (INDEPENDENT_AMBULATORY_CARE_PROVIDER_SITE_OTHER): Payer: Medicare Other | Admitting: Family Medicine

## 2020-12-30 ENCOUNTER — Other Ambulatory Visit: Payer: Self-pay

## 2020-12-30 ENCOUNTER — Encounter: Payer: Self-pay | Admitting: Family Medicine

## 2020-12-30 VITALS — BP 124/84 | HR 110 | Temp 98.2°F | Ht 66.0 in | Wt 203.0 lb

## 2020-12-30 DIAGNOSIS — N611 Abscess of the breast and nipple: Secondary | ICD-10-CM | POA: Diagnosis not present

## 2020-12-30 MED ORDER — DOXYCYCLINE HYCLATE 100 MG PO TABS: 100.0000 mg | ORAL_TABLET | Freq: Two times a day (BID) | ORAL | 0 refills | Status: AC

## 2020-12-30 NOTE — Progress Notes (Signed)
Chief Complaint  Patient presents with   Breast Problem    left    Karen Hall is a 65 y.o. female here for a skin complaint.  Duration: 4 days Location: L breast underneath Pruritic? No Painful? Yes Drainage? Pus came out of it No injury. Other associated symptoms: no nipple d/c or fevers; Redness over Therapies tried thus far: none  Past Medical History:  Diagnosis Date   Allergy    ANXIETY 10/18/2007   Qualifier: Diagnosis of  By: Sarajane Jews MD, Ishmael Holter    Autoimmune hepatitis [facility])    Cataract    "beginnings of"   Chronic low back pain    GERD 10/18/2007   Qualifier: Diagnosis of  By: Sherlynn Stalls, CMA, [place]     Hyperlipemia 03/12/2014   HYPERTENSION 10/18/2007   Qualifier: Diagnosis of  By: Sherlynn Stalls, CMA, [place]     Insomnia 03/12/2014   Irritable bowel syndrome 10/18/2007   Qualifier: Diagnosis of  By: Sarajane Jews MD, Ishmael Holter    Osteopenia    Recurrent knee pain    R knee hx meniscal tear 2002 with repair   Type 2 diabetes mellitus with hyperglycemia (HCC)     BP 124/84   Pulse (!) 110   Temp 98.2 F (36.8 C) (Oral)   Ht 5\' 6"  (1.676 m)   Wt 203 lb (92.1 kg)   SpO2 97%   BMI 32.77 kg/m  Gen: awake, alert, appearing stated age Lungs: No accessory muscle use Skin: examined in presence of female chaperone:  Over L lower breast at 7 o'clock, there is a patch of erythema that is ttp w mild warmth. +induration. No fluctuance, drainage, streaking, excoriation, scaling. Measures 3 cm x 2.5 cm.  Psych: Age appropriate judgment and insight  Furuncle of breast - Plan: doxycycline (VIBRA-TABS) 100 MG tablet  7 d of doxy, no need for I&D. Does not feel like any parenchymal issue, no need for [place] or diagnostic [place]. Warning signs and symptoms verbalized and written down in AVS.  F/u as originally scheduled. The patient voiced understanding and agreement to the plan.  [place], DO 12/30/20 11:38 AM

## 2020-12-30 NOTE — Patient Instructions (Addendum)
Schedule your mammogram when convenient.   Ice/cold pack over area for 10-15 min twice daily.  OK to take Tylenol 1000 mg (2 extra strength tabs) or 975 mg (3 regular strength tabs) every 6 hours as needed.  Keep the area clean and dry.   Things to look out for: increasing pain not relieved by ibuprofen/acetaminophen, fevers, spreading redness, drainage of pus, or foul odor.  Let us know if you need anything.

## 2021-01-12 ENCOUNTER — Other Ambulatory Visit: Payer: Self-pay | Admitting: Family Medicine

## 2021-01-12 DIAGNOSIS — I10 Essential (primary) hypertension: Secondary | ICD-10-CM

## 2021-01-14 ENCOUNTER — Other Ambulatory Visit: Payer: Self-pay | Admitting: Family Medicine

## 2021-01-15 ENCOUNTER — Other Ambulatory Visit: Payer: Self-pay | Admitting: Family Medicine

## 2021-01-28 ENCOUNTER — Other Ambulatory Visit: Payer: Self-pay | Admitting: Family Medicine

## 2021-01-28 MED ORDER — ZOLPIDEM TARTRATE ER 6.25 MG PO TBCR: EXTENDED_RELEASE_TABLET | ORAL | 5 refills | Status: DC

## 2021-03-01 ENCOUNTER — Other Ambulatory Visit: Payer: Self-pay | Admitting: Family Medicine

## 2021-03-01 DIAGNOSIS — M791 Myalgia, unspecified site: Secondary | ICD-10-CM

## 2021-03-01 DIAGNOSIS — G4700 Insomnia, unspecified: Secondary | ICD-10-CM

## 2021-03-03 ENCOUNTER — Telehealth: Payer: Self-pay | Admitting: Family Medicine

## 2021-03-03 NOTE — Telephone Encounter (Signed)
I attempted to leave  message for patient to call back and schedule Medicare Annual Wellness Visit (AWV) in office. No voice mail.  If not able to come in office, please offer to do virtually or by telephone.  Left office number and my jabber #[phone].  Due for AWVI   Please schedule at anytime with Nurse Health Advisor.   

## 2021-03-12 ENCOUNTER — Encounter: Payer: Self-pay | Admitting: Family Medicine

## 2021-03-13 ENCOUNTER — Encounter: Payer: Self-pay | Admitting: Family Medicine

## 2021-03-13 ENCOUNTER — Ambulatory Visit (INDEPENDENT_AMBULATORY_CARE_PROVIDER_SITE_OTHER): Payer: Medicare Other | Admitting: Family Medicine

## 2021-03-13 VITALS — BP 138/94 | HR 84 | Temp 98.2°F | Ht 66.5 in | Wt 203.0 lb

## 2021-03-13 DIAGNOSIS — I10 Essential (primary) hypertension: Secondary | ICD-10-CM

## 2021-03-13 MED ORDER — OLMESARTAN MEDOXOMIL 20 MG PO TABS: 20.0000 mg | ORAL_TABLET | Freq: Every day | ORAL | 3 refills | Status: DC

## 2021-03-13 NOTE — Patient Instructions (Addendum)
When you check your BP, make sure you have been doing something calm/relaxing 5 minutes prior to checking. Both feet should be flat on the floor and you should be sitting. Use your left arm and make sure it is in a relaxed position (on a table), and that the cuff is at the approximate level/height of your heart.  Keep the diet clean and stay active.  OK to take the new med at night.  Elavil is on the $4 list at [facility]. Stay hydrated. If still having dry mouth, we can switch it so something.   Let us know if you need anything.

## 2021-03-13 NOTE — Progress Notes (Signed)
Chief Complaint  Patient presents with   Hypertension    Subjective Karen Hall is a 65 y.o. female who presents for hypertension follow up. She does monitor home blood pressures. Blood pressures ranging from 130-140's/80-100's on average. Higher in evenings.  She is compliant with medication- losartan 50 mg/d. Patient has these side effects of medication: none She is sometimes adhering to a healthy diet overall. Current exercise: none lately No CP or SOB.    Past Medical History:  Diagnosis Date   Allergy    ANXIETY 10/18/2007   Qualifier: Diagnosis of  By: Sarajane Jews MD, Ishmael Holter    Autoimmune hepatitis [facility])    Cataract    "beginnings of"   Chronic low back pain    GERD 10/18/2007   Qualifier: Diagnosis of  By: Sherlynn Stalls, CMA, [place]     Hyperlipemia 03/12/2014   HYPERTENSION 10/18/2007   Qualifier: Diagnosis of  By: Sherlynn Stalls, [place], [place]     Insomnia 03/12/2014   Irritable bowel syndrome 10/18/2007   Qualifier: Diagnosis of  By: Sarajane Jews MD, Ishmael Holter    Osteopenia    Recurrent knee pain    R knee hx meniscal tear 2002 with repair   Type 2 diabetes mellitus with hyperglycemia (HCC)     Exam BP (!) 138/94 (BP Location: Left Arm, Cuff Size: Normal)    Pulse 84    Temp 98.2 F (36.8 C) (Oral)    Ht 5' 6.5" (1.689 m)    Wt 203 lb (92.1 kg)    SpO2 93%    BMI 32.27 kg/m  General:  well developed, well nourished, in no apparent distress Heart: RRR, no bruits, no LE edema Lungs: clear to auscultation, no accessory muscle use Psych: well oriented with normal range of affect and appropriate judgment/insight  Essential hypertension - Plan: olmesartan (BENICAR) 20 MG tablet  Chronic, uncontrolled. Stop losartan. Change to olmesartan for better 24 hr coverage. Counseled on diet and exercise. Monitor at home.  F/u in 1 mo to reck. The patient voiced understanding and agreement to the plan.  [place], DO 03/13/21  4:27 PM

## 2021-03-25 ENCOUNTER — Other Ambulatory Visit: Payer: Self-pay | Admitting: Family Medicine

## 2021-03-25 ENCOUNTER — Encounter: Payer: Self-pay | Admitting: Family Medicine

## 2021-03-25 ENCOUNTER — Other Ambulatory Visit (HOSPITAL_BASED_OUTPATIENT_CLINIC_OR_DEPARTMENT_OTHER): Payer: Self-pay | Admitting: Family Medicine

## 2021-03-25 DIAGNOSIS — Z1231 Encounter for screening mammogram for malignant neoplasm of breast: Secondary | ICD-10-CM

## 2021-03-25 MED ORDER — AMLODIPINE BESYLATE 5 MG PO TABS: 5.0000 mg | ORAL_TABLET | Freq: Every day | ORAL | 3 refills | Status: DC

## 2021-03-27 ENCOUNTER — Encounter (HOSPITAL_BASED_OUTPATIENT_CLINIC_OR_DEPARTMENT_OTHER): Payer: Self-pay

## 2021-03-27 ENCOUNTER — Ambulatory Visit (HOSPITAL_BASED_OUTPATIENT_CLINIC_OR_DEPARTMENT_OTHER)
Admission: RE | Admit: 2021-03-27 | Discharge: 2021-03-27 | Disposition: A | Discharge status: Home / Self Care | Payer: Medicare Other | Source: Ambulatory Visit | Attending: Family Medicine | Admitting: Family Medicine

## 2021-03-27 ENCOUNTER — Other Ambulatory Visit: Payer: Self-pay

## 2021-03-27 DIAGNOSIS — Z1231 Encounter for screening mammogram for malignant neoplasm of breast: Secondary | ICD-10-CM | POA: Diagnosis not present

## 2021-03-31 ENCOUNTER — Other Ambulatory Visit: Payer: Self-pay | Admitting: Family Medicine

## 2021-03-31 DIAGNOSIS — R928 Other abnormal and inconclusive findings on diagnostic imaging of breast: Secondary | ICD-10-CM

## 2021-04-01 ENCOUNTER — Ambulatory Visit
Admission: RE | Admit: 2021-04-01 | Discharge: 2021-04-01 | Disposition: A | Discharge status: Home / Self Care | Payer: Medicare Other | Source: Ambulatory Visit | Attending: Family Medicine | Admitting: Family Medicine

## 2021-04-01 ENCOUNTER — Other Ambulatory Visit: Payer: Self-pay | Admitting: Family Medicine

## 2021-04-01 DIAGNOSIS — N6001 Solitary cyst of right breast: Secondary | ICD-10-CM

## 2021-04-01 DIAGNOSIS — R928 Other abnormal and inconclusive findings on diagnostic imaging of breast: Secondary | ICD-10-CM

## 2021-04-01 DIAGNOSIS — R922 Inconclusive mammogram: Secondary | ICD-10-CM | POA: Diagnosis not present

## 2021-04-08 ENCOUNTER — Other Ambulatory Visit: Payer: Self-pay

## 2021-04-08 ENCOUNTER — Encounter: Payer: Self-pay | Admitting: Family Medicine

## 2021-04-08 ENCOUNTER — Encounter: Payer: Self-pay | Admitting: Gastroenterology

## 2021-04-08 ENCOUNTER — Ambulatory Visit (INDEPENDENT_AMBULATORY_CARE_PROVIDER_SITE_OTHER): Payer: Medicare Other | Admitting: Gastroenterology

## 2021-04-08 VITALS — BP 138/82 | HR 99 | Ht 66.5 in | Wt 205.1 lb

## 2021-04-08 DIAGNOSIS — R12 Heartburn: Secondary | ICD-10-CM | POA: Diagnosis not present

## 2021-04-08 DIAGNOSIS — Z9889 Other specified postprocedural states: Secondary | ICD-10-CM

## 2021-04-08 DIAGNOSIS — K754 Autoimmune hepatitis: Secondary | ICD-10-CM

## 2021-04-08 DIAGNOSIS — K219 Gastro-esophageal reflux disease without esophagitis: Secondary | ICD-10-CM | POA: Diagnosis not present

## 2021-04-08 MED ORDER — OMEPRAZOLE 20 MG PO CPDR: 20.0000 mg | DELAYED_RELEASE_CAPSULE | Freq: Every day | ORAL | 5 refills | Status: DC

## 2021-04-08 NOTE — Progress Notes (Signed)
Chief Complaint:    GERD  GI History: 66 yo female w/ a longstanding history of reflux symptoms for >20 years, having treated with multiple antacids and acid suppression agents over the years.   Evaluated by Pulmonary Clinic for chronic cough in 12/2015 diagnosed as 2/2 chronic reflux with perhaps some seasonal allergies contributing to reactive airway disease. Prescribed Singulair and Symbicort along with increased acid suppression and referral back to GI at that time.   01/22/2019: ENT at [facility]: Indirect laryngoscopy without concerning lesions   Completed TIF 0/99/8338 without complications and complete resolution of reflux symptoms.  Had tapered off all acid suppression therapy without breakthrough.   GERD history: -Index symptoms: Belching, sour brash, regurgitation, chronic nonproductive cough.  No dysphagia or odynophagia -Current medications: Omeprazole 20 mg/day -Complications: Hiatal hernia, chronic cough -Exacerbated by pork, spicy foods, tomato based sauces   GERD-HRQL Questionnaire: 21/50 (preoperative; on high-dose therapy)   GERD evaluation: - Barium Esophagram (07/2015): 13 mm barium tablet passes easily. Full column gastroesophageal reflux observed. - EGD (02/15/2018, Dr. Havery Moros): 1 cm hiatal hernia with otherwise normal esophagus, normal stomach and duodenum. -EGD with TIF (06/27/2019, Dr. Bryan Lemma): Successful TIF with 28 fasteners placed    Separately, follows with Dawn Drazek at [facility] for Autoimmune Hepatitis.  HPI:     Patient is a 66 y.o. female with a history of HTN, hyperlipidemia, autoimmune hepatitis, presenting to the Gastroenterology Clinic for follow-up.  Last seen by me on 07/24/2019.  At that time was doing well s/p TIF, tolerating postoperative diet and was weaning Prilosec.  Chronic cough was improving and no breakthrough reflux symptoms aside from intermittent globus sensation at that time.  Had since weaned off PPI  completely.  Today, she states started slowly getting intermittent waterbrash and regurgitation about 6 months ago, progressive over the 6 months. Started taking omeprazole 20 mg/day again with resolution. No "pop" sensation or exacerbating event. No dysphagia.    Review of systems:     No chest pain, no SOB, no fevers, no urinary sx   Past Medical History:  Diagnosis Date   Allergy    ANXIETY 10/18/2007   Qualifier: Diagnosis of  By: Sarajane Jews MD, Ishmael Holter    Autoimmune hepatitis [facility])    Cataract    "beginnings of"   Chronic low back pain    GERD 10/18/2007   Qualifier: Diagnosis of  By: Sherlynn Stalls, CMA, [place]     Hyperlipemia 03/12/2014   HYPERTENSION 10/18/2007   Qualifier: Diagnosis of  By: Sherlynn Stalls, [place], [place]     Insomnia 03/12/2014   Irritable bowel syndrome 10/18/2007   Qualifier: Diagnosis of  By: Sarajane Jews MD, Ishmael Holter    Osteopenia    Recurrent knee pain    R knee hx meniscal tear 2002 with repair   Type 2 diabetes mellitus with hyperglycemia ([place])     Patient's surgical history, family medical history, social history, medications and allergies were all reviewed in Epic    Current Outpatient Medications  Medication Sig Dispense Refill   amitriptyline (ELAVIL) 25 MG tablet TAKE 1 TABLET BY MOUTH EVERYDAY AT BEDTIME 90 tablet 1   amLODipine (NORVASC) 5 MG tablet Take 1 tablet (5 mg total) by mouth daily. 30 tablet 3   atorvastatin (LIPITOR) 10 MG tablet TAKE 1 TABLET BY MOUTH EVERY DAY 90 tablet 1   Cholecalciferol (VITAMIN D3) 125 MCG (5000 UT) CAPS Take 1 capsule by mouth daily.     citalopram (CELEXA) 20 MG tablet TAKE 1  TABLET BY MOUTH  DAILY 90 tablet 3   Homeopathic Products (ZICAM ALLERGY RELIEF NA) Place 1 application into the nose 2 (two) times daily as needed (congestion/allergies).      Menthol, Topical Analgesic, (BIOFREEZE EX) Apply 1 application topically at bedtime as needed (pain).     olmesartan (BENICAR) 20 MG tablet Take 1 tablet (20 mg total) by mouth daily. 30 tablet  3   Polyethyl Glycol-Propyl Glycol (SYSTANE) 0.4-0.3 % SOLN Place 1 drop into both eyes daily as needed (dry eyes).     simethicone (GAS-X) 80 MG chewable tablet Chew 1 tablet (80 mg total) by mouth 4 (four) times daily as needed (gas/bloating). 60 tablet 0   zolpidem (AMBIEN CR) 6.25 MG CR tablet 1 tablet at bedtime as needed and only for severe sleep disorder. Use as little as possible. Needs office visit prior to further refills. 30 tablet 5   No current facility-administered medications for this visit.    Physical Exam:     There were no vitals taken for this visit.  GENERAL:  Pleasant female in NAD PSYCH: : Cooperative, normal affect ABDOMEN:  Nondistended NEURO: Alert and oriented x 3, no focal neurologic deficits   IMPRESSION and PLAN:    1) GERD 2) Waterbrash/Sour brash 3) Regurgitation 4) History of fundoplication (TIF) 5) History of hiatal hernia-repaired at the time of TIF  66 year old female with longstanding history of GERD, with complete resolution of reflux symptoms with TIF in 06/2019.  Unfortunately sounds like she has had slow recurrence of reflux over the last 6 months or so.  No clear inciting event or "pop".  Reflux symptoms now well controlled since restarting omeprazole 20 mg/daily.  She states "I am perfectly happy if low-dose omeprazole controls my reflux" as this is much better than what she was prior to TIF.  Discussed the role of EGD, barium esophagram, etc., and she would prefer continue with medical management alone for now. - Continue omeprazole 20 mg/day.  Provided with Rx today - Continue antireflux lifestyle/dietary modifications with avoidance of exacerbating foods - If symptoms progress, plan for EGD with [place] placement (off PPI)  6) Autoimmune Hepatitis    -Has not seen [place] since 06/2019.  Replaced referral today so that she can follow-up with [facility] [place] at [place] for ongoing management  RTC in 6 months or sooner as  needed  I spent 30 minutes of time, including in depth chart review, independent review of results as outlined above, communicating results with the patient directly, face-to-face time with the patient, coordinating care, and ordering studies and medications as appropriate, and documentation.   Lavena Bullion ,DO, FACG 04/08/2021, 1:53 PM

## 2021-04-08 NOTE — Patient Instructions (Signed)
If you are age 69 or older, your body mass index should be between 23-30. Your Body mass index is 32.61 kg/m. If this is out of the aforementioned range listed, please consider follow up with your Primary Care Provider.  If you are age 45 or younger, your body mass index should be between 19-25. Your Body mass index is 32.61 kg/m. If this is out of the aformentioned range listed, please consider follow up with your Primary Care Provider.   __________________________________________________________  The [facility] providers would like to encourage you to use [facility] to communicate with providers for non-urgent requests or questions.  Due to long hold times on the telephone, sending your provider a message by [facility] may be a faster and more efficient way to get a response.  Please allow 48 business hours for a response.  Please remember that this is for non-urgent requests.   We have sent the following medications to your pharmacy for you to pick up at your convenience:  Omeprazole 20 mg  We are referring you to [facility]. They will contact you with an appointment time.  Return to our clinic in 6 months.  Thank you for choosing me and [facility].  Vito Cirigliano, D.O.

## 2021-04-13 ENCOUNTER — Encounter: Payer: Self-pay | Admitting: Family Medicine

## 2021-04-13 ENCOUNTER — Ambulatory Visit (INDEPENDENT_AMBULATORY_CARE_PROVIDER_SITE_OTHER): Payer: Medicare Other | Admitting: Family Medicine

## 2021-04-13 ENCOUNTER — Other Ambulatory Visit: Payer: Self-pay | Admitting: Family Medicine

## 2021-04-13 VITALS — BP 110/80 | HR 97 | Temp 98.3°F | Ht 66.5 in | Wt 200.1 lb

## 2021-04-13 DIAGNOSIS — G8929 Other chronic pain: Secondary | ICD-10-CM | POA: Diagnosis not present

## 2021-04-13 DIAGNOSIS — I10 Essential (primary) hypertension: Secondary | ICD-10-CM

## 2021-04-13 DIAGNOSIS — L989 Disorder of the skin and subcutaneous tissue, unspecified: Secondary | ICD-10-CM

## 2021-04-13 DIAGNOSIS — M25532 Pain in left wrist: Secondary | ICD-10-CM | POA: Diagnosis not present

## 2021-04-13 NOTE — Patient Instructions (Signed)
Give [place] 2-3 business days to get the results of your labs back.   Because your blood pressure is well-controlled, you no longer have to check your blood pressure at home anymore unless you wish. Some people check it twice daily every day and some people stop altogether. Either or anything in between is fine. Strong work!  Keep the diet clean and stay active.  Aim to do some physical exertion for 150 minutes per week. This is typically divided into 5 days per week, 30 minutes per day. The activity should be enough to get your heart rate up. Anything is better than nothing if you have time constraints.  If you do not hear anything about your referral in the next 1-2 weeks, call our office and ask for an update.  Heat (pad or rice pillow in microwave) over affected area, 10-15 minutes twice daily.   Ice/cold pack over area for 10-15 min twice daily.  OK to take Tylenol 1000 mg (2 extra strength tabs) or 975 mg (3 regular strength tabs) every 6 hours as needed.  Let us know if you need anything.  Wrist and Forearm Exercises Do exercises exactly as told by your health care provider and adjust them as directed. It is normal to feel mild stretching, pulling, tightness, or discomfort as you do these exercises, but you should stop right away if you feel sudden pain or your pain gets worse.   RANGE OF MOTION EXERCISES These exercises warm up your muscles and joints and improve the movement and flexibility of your injured wrist and forearm. These exercises also help to relieve pain, numbness, and tingling. These exercises are done using the muscles in your injured wrist and forearm. Exercise A: Wrist Flexion, Active With your fingers relaxed, bend your wrist forward as far as you can. Hold this position for 30 seconds. Repeat 2 times. Complete this exercise 3 times per week. Exercise B: Wrist Extension, Active With your fingers relaxed, bend your wrist backward as far as you can. Hold this position  for 30 seconds. Repeat 2 times. Complete this exercise 3 times per week. Exercise C: Supination, Active  Stand or sit with your arms at your sides. Bend your left / right elbow to an "L" shape (90 degrees). Turn your palm upward until you feel a gentle stretch on the inside of your forearm. Hold this position for 30 seconds. Slowly return your palm to the starting position. Repeat 2 times. Complete this exercise 3 times per week. Exercise D: Pronation, Active  Stand or sit with your arms at your sides. Bend your left / right elbow to an "L" shape (90 degrees). Turn your palm downward until you feel a gentle stretch on the top of your forearm. Hold this position for 30 seconds. Slowly return your palm to the starting position. Repeat 2 times. Complete this exercise once a day.  STRETCHING EXERCISES These exercises warm up your muscles and joints and improve the movement and flexibility of your injured wrist and forearm. These exercises also help to relieve pain, numbness, and tingling. These exercises are done using your healthy wrist and forearm to help stretch the muscles in your injured wrist and forearm. Exercise E: Wrist Flexion, Passive  Extend your left / right arm in front of you, relax your wrist, and point your fingers downward. Gently push on the back of your hand. Stop when you feel a gentle stretch on the top of your forearm. Hold this position for 30 seconds. Repeat 2  times. Complete this exercise 3 times per week. Exercise F: Wrist Extension, Passive  Extend your left / right arm in front of you and turn your palm upward. Gently pull your palm and fingertips back so your fingers point downward. You should feel a gentle stretch on the palm-side of your forearm. Hold this position for 30 seconds. Repeat 2 times. Complete this exercise 3 times per week. Exercise G: Forearm Rotation, Supination, Passive Sit with your left / right elbow bent to an "L" shape (90 degrees)  with your forearm resting on a table. Keeping your upper body and shoulder still, use your other hand to rotate your forearm palm-up until you feel a gentle to moderate stretch. Hold this position for 30 seconds. Slowly release the stretch and return to the starting position. Repeat 2 times. Complete this exercise 3 times per week. Exercise H: Forearm Rotation, Pronation, Passive Sit with your left / right elbow bent to an "L" shape (90 degrees) with your forearm resting on a table. Keeping your upper body and shoulder still, use your other hand to rotate your forearm palm-down until you feel a gentle to moderate stretch. Hold this position for 30 seconds. Slowly release the stretch and return to the starting position. Repeat 2 times. Complete this exercise 3 times per week.  STRENGTHENING EXERCISES These exercises build strength and endurance in your wrist and forearm. Endurance is the ability to use your muscles for a long time, even after they get tired. Exercise I: Wrist Flexors  Sit with your left / right forearm supported on a table and your hand resting palm-up over the edge of the table. Your elbow should be bent to an "L" shape (about 90 degrees) and be below the level of your shoulder. Hold a 3-5 lb weight in your left / right hand. Or, hold a rubber exercise band or tube in both hands, keeping your hands at the same level and hip distance apart. There should be a slight tension in the exercise band or tube. Slowly curl your hand up toward your forearm. Hold this position for 3 seconds. Slowly lower your hand back to the starting position. Repeat 2 times. Complete this exercise 3 times per week. Exercise J: Wrist Extensors  Sit with your left / right forearm supported on a table and your hand resting palm-down over the edge of the table. Your elbow should be bent to an "L" shape (about 90 degrees) and be below the level of your shoulder. Hold a 3-5 lb weight in your left / right  hand. Or, hold a rubber exercise band or tube in both hands, keeping your hands at the same level and hip distance apart. There should be a slight tension in the exercise band or tube. Slowly curl your hand up toward your forearm. Hold this position for 3 seconds. Slowly lower your hand back to the starting position. Repeat 2 times. Complete this exercise 3 times per week. Exercise K: Forearm Rotation, Supination  Sit with your left / right forearm supported on a table and your hand resting palm-down. Your elbow should be at your side, bent to an "L" shape (about 90 degrees), and below the level of your shoulder. Keep your wrist stable and in a neutral position throughout the exercise. Gently hold a lightweight hammer with your left / right hand. Without moving your elbow or wrist, slowly rotate your palm upward to a thumbs-up position. Hold this position for 3 seconds. Slowly return your forearm to the  starting position. Repeat 2 times. Complete this exercise 3 times per week. Exercise L: Forearm Rotation, Pronation  Sit with your left / right forearm supported on a table and your hand resting palm-up. Your elbow should be at your side, bent to an "L" shape (about 90 degrees), and below the level of your shoulder. Keep your wrist stable. Do not allow it to move backward or forward during the exercise. Gently hold a lightweight hammer with your left / right hand. Without moving your elbow or wrist, slowly rotate your palm and hand upward to a thumbs-up position. Hold this position for 3 seconds. Slowly return your forearm to the starting position. Repeat 2 times. Complete this exercise 3 times per week. Exercise M: Grip Strengthening  Hold one of these items in your left / right hand: play dough, therapy putty, a dense sponge, a stress ball, or a large, rolled sock. Squeeze as hard as you can without increasing pain. Hold this position for 5 seconds. Slowly release your grip. Repeat 2  times. Complete this exercise 3 times per week.  This information is not intended to replace advice given to you by your health care provider. Make sure you discuss any questions you have with your health care provider. Document Released: 01/13/2005 Document Revised: 11/24/2015 Document Reviewed: 11/24/2014 Elsevier Interactive Patient Education  [employer].

## 2021-04-13 NOTE — Progress Notes (Signed)
Chief Complaint  Patient presents with   Follow-up    Blood pressure    Subjective Karen Hall is a 66 y.o. female who presents for hypertension follow up. She does monitor home blood pressures. Blood pressures ranging from 110-120's/70's on average. She is compliant with medications- Norvasc 5 mg/d, Benicar 20 mg/d. Patient has these side effects of medication: none She is adhering to a healthy diet overall. Current exercise: none No Cp or SOB.   L wrist pain 2-3 mo of L wrist pain at base of thumb. Has been typing more which flares s/s's. Wearing thumb spica w little relief. No trauma. No bruising, swelling, neuro s/s's, redness.   Lesion on lower medial lid for 1 mo. Started as pimple and now a hard bump. Asked ophto what to do, said to ice it and follow up w PCP. No vision changes, bruising, redness, itching, pain, drainage.    Past Medical History:  Diagnosis Date   Allergy    ANXIETY 10/18/2007   Qualifier: Diagnosis of  By: Sarajane Jews MD, Ishmael Holter    Autoimmune hepatitis [facility])    Cataract    "beginnings of"   Chronic low back pain    GERD 10/18/2007   Qualifier: Diagnosis of  By: Sherlynn Stalls, CMA, [place]     Hyperlipemia 03/12/2014   HYPERTENSION 10/18/2007   Qualifier: Diagnosis of  By: Sherlynn Stalls, [place], [place]     Insomnia 03/12/2014   Irritable bowel syndrome 10/18/2007   Qualifier: Diagnosis of  By: Sarajane Jews MD, Ishmael Holter    Osteopenia    Recurrent knee pain    R knee hx meniscal tear 2002 with repair   Type 2 diabetes mellitus with hyperglycemia (HCC)     Exam BP 110/80    Pulse 97    Temp 98.3 F (36.8 C) (Oral)    Ht 5' 6.5" (1.689 m)    Wt 200 lb 2 oz (90.8 kg)    SpO2 98%    BMI 31.82 kg/m  General:  well developed, well nourished, in no apparent distress Heart: RRR, no bruits, no LE edema Lungs: clear to auscultation, no accessory muscle use Skin: over medial epicanthal fold on lower lid, there is a circular and cystic lesion measuring approx 0.3 cm in diameter. No ttp,  drainage, erythema, fluctuance.  MSK: +TTP over the lateral thumb extensor tendon at prox 1st MCP. No edema, decreased ROM. +resisted extension pain.  Psych: well oriented with normal range of affect and appropriate judgment/insight  Essential hypertension - Plan: Comprehensive metabolic panel, CANCELED: Basic metabolic panel  Chronic pain of left wrist  Skin lesion - Plan: Ambulatory referral to Dermatology  Chronic, stable. Cont Benicar 20 mg/d, Norvasc 5 mg/d. Counseled on diet and exercise. Chronic, unstable. Stretches/exercises, heat, ice, Tylenol, cont thumb spica. OT vs hand referral if no better. Refer derm but also have eye doc look at it given prox to globe.  F/u in 6 mo for CPE or prn. The patient voiced understanding and agreement to the plan.  [place], DO 04/13/21  2:10 PM

## 2021-04-14 ENCOUNTER — Other Ambulatory Visit: Payer: Self-pay | Admitting: Family Medicine

## 2021-04-14 ENCOUNTER — Other Ambulatory Visit: Payer: Medicare Other

## 2021-04-14 DIAGNOSIS — I10 Essential (primary) hypertension: Secondary | ICD-10-CM

## 2021-04-14 LAB — COMPREHENSIVE METABOLIC PANEL
ALT: 35 U/L (ref 0–35)
AST: 45 U/L — ABNORMAL HIGH (ref 0–37)
Albumin: 4.2 g/dL (ref 3.5–5.2)
Alkaline Phosphatase: 211 U/L — ABNORMAL HIGH (ref 39–117)
BUN: 19 mg/dL (ref 6–23)
CO2: 26 mEq/L (ref 19–32)
Calcium: 9.8 mg/dL (ref 8.4–10.5)
Chloride: 101 mEq/L (ref 96–112)
Creatinine, Ser: 1.14 mg/dL (ref 0.40–1.20)
GFR: 49.75 mL/min — ABNORMAL LOW (ref >60.00)
Glucose, Bld: 169 mg/dL — ABNORMAL HIGH (ref 70–99)
Potassium: 4.3 mEq/L (ref 3.5–5.1)
Sodium: 135 mEq/L (ref 135–145)
Total Bilirubin: 0.4 mg/dL (ref 0.2–1.2)
Total Protein: 6.9 g/dL (ref 6.0–8.3)

## 2021-04-18 LAB — ALKALINE PHOSPHATASE, ISOENZYMES
Alkaline Phosphatase: 264 IU/L — ABNORMAL HIGH (ref 44–121)
BONE FRACTION: 35 % (ref 14–68)
INTESTINAL FRAC.: 0 % (ref 0–18)
LIVER FRACTION: 65 % (ref 18–85)

## 2021-05-19 ENCOUNTER — Other Ambulatory Visit: Payer: Self-pay

## 2021-05-21 ENCOUNTER — Encounter: Payer: Self-pay | Admitting: Family Medicine

## 2021-05-22 ENCOUNTER — Other Ambulatory Visit: Payer: Self-pay | Admitting: Family Medicine

## 2021-05-22 DIAGNOSIS — G8929 Other chronic pain: Secondary | ICD-10-CM

## 2021-05-22 DIAGNOSIS — M25532 Pain in left wrist: Secondary | ICD-10-CM

## 2021-05-25 NOTE — Progress Notes (Signed)
This encounter was created in error - please disregard.

## 2021-06-01 DIAGNOSIS — H26491 Other secondary cataract, right eye: Secondary | ICD-10-CM | POA: Diagnosis not present

## 2021-06-01 DIAGNOSIS — H5213 Myopia, bilateral: Secondary | ICD-10-CM | POA: Diagnosis not present

## 2021-06-01 DIAGNOSIS — Z961 Presence of intraocular lens: Secondary | ICD-10-CM | POA: Diagnosis not present

## 2021-06-01 DIAGNOSIS — H43813 Vitreous degeneration, bilateral: Secondary | ICD-10-CM | POA: Diagnosis not present

## 2021-06-03 DIAGNOSIS — M1812 Unilateral primary osteoarthritis of first carpometacarpal joint, left hand: Secondary | ICD-10-CM | POA: Diagnosis not present

## 2021-06-03 DIAGNOSIS — M79642 Pain in left hand: Secondary | ICD-10-CM | POA: Diagnosis not present

## 2021-06-04 ENCOUNTER — Other Ambulatory Visit: Payer: Self-pay | Admitting: Family Medicine

## 2021-06-04 DIAGNOSIS — I10 Essential (primary) hypertension: Secondary | ICD-10-CM

## 2021-06-07 ENCOUNTER — Other Ambulatory Visit: Payer: Self-pay | Admitting: Family Medicine

## 2021-06-10 DIAGNOSIS — M1812 Unilateral primary osteoarthritis of first carpometacarpal joint, left hand: Secondary | ICD-10-CM | POA: Diagnosis not present

## 2021-06-18 ENCOUNTER — Other Ambulatory Visit: Payer: Self-pay | Admitting: Family Medicine

## 2021-06-29 DIAGNOSIS — M79642 Pain in left hand: Secondary | ICD-10-CM | POA: Diagnosis not present

## 2021-06-29 DIAGNOSIS — M1812 Unilateral primary osteoarthritis of first carpometacarpal joint, left hand: Secondary | ICD-10-CM | POA: Diagnosis not present

## 2021-07-03 DIAGNOSIS — M79642 Pain in left hand: Secondary | ICD-10-CM | POA: Diagnosis not present

## 2021-07-03 DIAGNOSIS — M1812 Unilateral primary osteoarthritis of first carpometacarpal joint, left hand: Secondary | ICD-10-CM | POA: Diagnosis not present

## 2021-07-10 DIAGNOSIS — M79642 Pain in left hand: Secondary | ICD-10-CM | POA: Diagnosis not present

## 2021-07-10 DIAGNOSIS — M1812 Unilateral primary osteoarthritis of first carpometacarpal joint, left hand: Secondary | ICD-10-CM | POA: Diagnosis not present

## 2021-07-13 DIAGNOSIS — M1812 Unilateral primary osteoarthritis of first carpometacarpal joint, left hand: Secondary | ICD-10-CM | POA: Diagnosis not present

## 2021-07-13 DIAGNOSIS — D225 Melanocytic nevi of trunk: Secondary | ICD-10-CM | POA: Diagnosis not present

## 2021-07-13 DIAGNOSIS — D210 Benign neoplasm of connective and other soft tissue of head, face and neck: Secondary | ICD-10-CM | POA: Diagnosis not present

## 2021-07-13 DIAGNOSIS — L821 Other seborrheic keratosis: Secondary | ICD-10-CM | POA: Diagnosis not present

## 2021-07-23 DIAGNOSIS — M1812 Unilateral primary osteoarthritis of first carpometacarpal joint, left hand: Secondary | ICD-10-CM | POA: Diagnosis not present

## 2021-08-22 ENCOUNTER — Other Ambulatory Visit: Payer: Self-pay | Admitting: Family Medicine

## 2021-08-23 ENCOUNTER — Other Ambulatory Visit: Payer: Self-pay | Admitting: Family Medicine

## 2021-08-23 DIAGNOSIS — M791 Myalgia, unspecified site: Secondary | ICD-10-CM

## 2021-08-23 DIAGNOSIS — G4700 Insomnia, unspecified: Secondary | ICD-10-CM

## 2021-08-27 ENCOUNTER — Telehealth: Payer: Self-pay | Admitting: Family Medicine

## 2021-08-27 NOTE — Telephone Encounter (Signed)
Appt scheduled

## 2021-08-27 NOTE — Telephone Encounter (Signed)
Patient returned phone call regarding symps. Patient stated she's still experiencing low BP and balance. Trans to triage for symps

## 2021-08-27 NOTE — Telephone Encounter (Signed)
Caller Name [name] Phone Number [phone] Patient Name Karen Hall Patient DOB 1955/05/13 Call Type Message Only Information Provided Reason for Call Returning a Call from the Office Initial [name] states she would like to schedule an appt. Pt has had low blood pressure and has fallen multiple times in the past months. Disp. Time Disposition Final User 08/27/2021 10:34:16 AM General Information Provided Yes Jani Files Call Closed By: Jani Files Transaction Date/Time: 08/27/2021 10:29:55 AM (ET)

## 2021-08-28 ENCOUNTER — Ambulatory Visit (INDEPENDENT_AMBULATORY_CARE_PROVIDER_SITE_OTHER): Payer: Medicare Other | Admitting: Family Medicine

## 2021-08-28 ENCOUNTER — Encounter: Payer: Self-pay | Admitting: Family Medicine

## 2021-08-28 VITALS — BP 92/62 | HR 96 | Temp 98.0°F | Ht 66.0 in | Wt 207.4 lb

## 2021-08-28 DIAGNOSIS — I10 Essential (primary) hypertension: Secondary | ICD-10-CM | POA: Diagnosis not present

## 2021-08-28 DIAGNOSIS — E2839 Other primary ovarian failure: Secondary | ICD-10-CM

## 2021-08-28 NOTE — Patient Instructions (Addendum)
Stop your amlodipine/Norvasc.  Continue the Benicar for now.   Stay hydrated.  Monitor blood pressure as you have been. Send me a message in 2 weeks with your readings. Send them sooner if you aren't improving.   Please consider counseling. Contact [phone] to schedule an appointment or inquire about cost/insurance coverage.  [facility] located at [place], [place], [place].  Phone number = [phone].  Dr. Lennice Sites - Adult Psychiatry.    [facility] located at [place], [place], [place]. Phone number = [phone].   [facility] located at [address], [place], [place].  Phone number = [phone].   [facility] located at [place], [place], [place].  Phone number = [phone].  Let us know if you need anything.

## 2021-08-28 NOTE — Progress Notes (Signed)
Chief Complaint  Patient presents with   Hypotension    Several falls recently     Subjective Karen Hall is a 66 y.o. female who presents for hypertension follow up. She does monitor home blood pressures. Blood pressures ranging from 80-120's/60-80's on average. She is compliant with medications- Norvasc 5 mg/d, Benicar 20 mg/d. Patient has these side effects of medication: has been dizzy and falling over the past 2 mo. Describes dizziness as lightheaded when she stands up too fast or overexerts herself.  She is adhering to a healthy diet overall. Current exercise: chair yoga No Cp or SOB.    Past Medical History:  Diagnosis Date   Allergy    ANXIETY 10/18/2007   Qualifier: Diagnosis of  By: Sarajane Jews MD, Ishmael Holter    Autoimmune hepatitis [facility])    Cataract    "beginnings of"   Chronic low back pain    GERD 10/18/2007   Qualifier: Diagnosis of  By: Sherlynn Stalls, CMA, [place]     Hyperlipemia 03/12/2014   HYPERTENSION 10/18/2007   Qualifier: Diagnosis of  By: Sherlynn Stalls, [place], [place]     Insomnia 03/12/2014   Irritable bowel syndrome 10/18/2007   Qualifier: Diagnosis of  By: Sarajane Jews MD, Ishmael Holter    Osteopenia    Recurrent knee pain    R knee hx meniscal tear 2002 with repair   Type 2 diabetes mellitus with hyperglycemia (HCC)     Exam BP 92/62   Pulse 96   Temp 98 F (36.7 C) (Oral)   Ht '5\' 6"'$  (1.676 m)   Wt 207 lb 6 oz (94.1 kg)   SpO2 98%   BMI 33.47 kg/m  General:  well developed, well nourished, in no apparent distress Heart: RRR, no bruits, no LE edema Lungs: clear to auscultation, no accessory muscle use Psych: well oriented with normal range of affect and appropriate judgment/insight  Essential hypertension  Estrogen deficiency - Plan: DG Bone Density  Chronic, not controlled. BP dropping too low. Stop Norvasc 5 mg/d. Cont Benicar 20 mg/d. Monitor BP at home, send readings in [redacted] weeks along w symptom update, sooner if she is not improving. Stay hydrated. Counseled on diet and  exercise. DEXA F/u in 1 mo for DM visit and reck of BP. The patient voiced understanding and agreement to the plan.  [place], DO 08/28/21  3:33 PM

## 2021-08-31 ENCOUNTER — Ambulatory Visit: Payer: Medicare Other | Admitting: Family Medicine

## 2021-09-10 ENCOUNTER — Encounter: Payer: Self-pay | Admitting: Family Medicine

## 2021-09-17 ENCOUNTER — Telehealth (HOSPITAL_BASED_OUTPATIENT_CLINIC_OR_DEPARTMENT_OTHER): Payer: Self-pay

## 2021-09-29 ENCOUNTER — Ambulatory Visit (INDEPENDENT_AMBULATORY_CARE_PROVIDER_SITE_OTHER): Payer: Medicare Other | Admitting: Family Medicine

## 2021-09-29 ENCOUNTER — Encounter: Payer: Self-pay | Admitting: Family Medicine

## 2021-09-29 VITALS — BP 120/72 | HR 109 | Temp 98.2°F | Ht 66.0 in | Wt 204.1 lb

## 2021-09-29 DIAGNOSIS — I10 Essential (primary) hypertension: Secondary | ICD-10-CM

## 2021-09-29 DIAGNOSIS — J302 Other seasonal allergic rhinitis: Secondary | ICD-10-CM | POA: Diagnosis not present

## 2021-09-29 DIAGNOSIS — E1165 Type 2 diabetes mellitus with hyperglycemia: Secondary | ICD-10-CM

## 2021-09-29 LAB — COLOGUARD: Cologuard: NEGATIVE

## 2021-09-29 MED ORDER — MONTELUKAST SODIUM 10 MG PO TABS: 10.0000 mg | ORAL_TABLET | Freq: Every day | ORAL | 3 refills | Status: DC

## 2021-09-29 NOTE — Progress Notes (Signed)
Subjective:   Chief Complaint  Patient presents with   Follow-up    1 month     Karen Hall is a 66 y.o. female here for follow-up of diabetes.   Etana does not monitor her sugars.  Patient does not require insulin.   Medications include: diet controlled Diet is fair.  Exercise: some walking  HTN She does monitor home blood pressures. Blood pressures ranging from 120's/70's on average. She is compliant with medications- olmesartan 20 mg/d. Patient has these side effects of medication: none Diet/exercise as above.  No CP or SOB.   Seasonal allergies Hx of allergies, on Claritin. Has coughing and running nose. INCS not helpful. No fevers or wheezing.   Past Medical History:  Diagnosis Date   Allergy    ANXIETY 10/18/2007   Qualifier: Diagnosis of  By: Sarajane Jews MD, Ishmael Holter    Autoimmune hepatitis [facility])    Cataract    "beginnings of"   Chronic low back pain    GERD 10/18/2007   Qualifier: Diagnosis of  By: Sherlynn Stalls, CMA, [place]     Hyperlipemia 03/12/2014   HYPERTENSION 10/18/2007   Qualifier: Diagnosis of  By: Sherlynn Stalls, [place], [place]     Insomnia 03/12/2014   Irritable bowel syndrome 10/18/2007   Qualifier: Diagnosis of  By: Sarajane Jews MD, Ishmael Holter    Osteopenia    Recurrent knee pain    R knee hx meniscal tear 2002 with repair   Type 2 diabetes mellitus with hyperglycemia ([place])      Related testing: Retinal exam: Done Pneumovax: due for PCV20  Objective:  BP 120/72   Pulse (!) 109   Temp 98.2 F (36.8 C) (Oral)   Ht '5\' 6"'$  (1.676 m)   Wt 204 lb 2 oz (92.6 kg)   SpO2 97%   BMI 32.95 kg/m  General:  Well developed, well nourished, in no apparent distress Skin:  Warm, no pallor or diaphoresis Head:  Normocephalic, atraumatic Eyes:  Pupils equal and round, sclera anicteric without injection  Lungs:  CTAB, no access msc use Cardio:  RRR, no bruits, no LE edema Musculoskeletal:  Symmetrical muscle groups noted without atrophy or deformity Neuro:  Sensation intact to  pinprick on feet Psych: Age appropriate judgment and insight  Assessment:   Essential hypertension  Seasonal allergies - Plan: montelukast (SINGULAIR) 10 MG tablet  Type 2 diabetes mellitus with hyperglycemia, without long-term current use of insulin (HCC) - Plan: Lipid panel, Hemoglobin A1c, Comprehensive metabolic panel, Microalbumin / creatinine urine ratio   Plan:   Chronic, stable. Cont diet control. Asked she think about the PCV20. Info provided today. Counseled on diet and exercise. Chronic, stable. Cont Benicar 20 mg/d.  Chronic, unstable. Cont Claritin, add Singulair qd prn.  F/u in 6 mo. The patient voiced understanding and agreement to the plan.  [place], DO 09/29/21 11:15 AM

## 2021-09-29 NOTE — Patient Instructions (Signed)
Give us 2-3 business days to get the results of your labs back.   Keep the diet clean and stay active.  Let us know if you need anything. 

## 2021-09-30 NOTE — Addendum Note (Signed)
Addended by: Kelle Darting A on: 09/30/2021 03:39 PM   Modules accepted: Orders

## 2021-10-01 ENCOUNTER — Other Ambulatory Visit: Payer: Self-pay | Admitting: Family Medicine

## 2021-10-01 ENCOUNTER — Ambulatory Visit
Admission: RE | Admit: 2021-10-01 | Discharge: 2021-10-01 | Disposition: A | Discharge status: Home / Self Care | Payer: Medicare Other | Source: Ambulatory Visit | Attending: Family Medicine | Admitting: Family Medicine

## 2021-10-01 ENCOUNTER — Other Ambulatory Visit (INDEPENDENT_AMBULATORY_CARE_PROVIDER_SITE_OTHER): Payer: Medicare Other

## 2021-10-01 DIAGNOSIS — E1165 Type 2 diabetes mellitus with hyperglycemia: Secondary | ICD-10-CM | POA: Diagnosis not present

## 2021-10-01 DIAGNOSIS — R928 Other abnormal and inconclusive findings on diagnostic imaging of breast: Secondary | ICD-10-CM

## 2021-10-01 DIAGNOSIS — N6001 Solitary cyst of right breast: Secondary | ICD-10-CM

## 2021-10-01 DIAGNOSIS — N6312 Unspecified lump in the right breast, upper inner quadrant: Secondary | ICD-10-CM | POA: Diagnosis not present

## 2021-10-01 DIAGNOSIS — N6311 Unspecified lump in the right breast, upper outer quadrant: Secondary | ICD-10-CM | POA: Diagnosis not present

## 2021-10-01 DIAGNOSIS — N6310 Unspecified lump in the right breast, unspecified quadrant: Secondary | ICD-10-CM

## 2021-10-01 LAB — COMPREHENSIVE METABOLIC PANEL
ALT: 32 U/L (ref 0–35)
AST: 44 U/L — ABNORMAL HIGH (ref 0–37)
Albumin: 4.1 g/dL (ref 3.5–5.2)
Alkaline Phosphatase: 196 U/L — ABNORMAL HIGH (ref 39–117)
BUN: 19 mg/dL (ref 6–23)
CO2: 27 mEq/L (ref 19–32)
Calcium: 9.6 mg/dL (ref 8.4–10.5)
Chloride: 102 mEq/L (ref 96–112)
Creatinine, Ser: 1.2 mg/dL (ref 0.40–1.20)
GFR: 46.63 mL/min — ABNORMAL LOW (ref >60.00)
Glucose, Bld: 138 mg/dL — ABNORMAL HIGH (ref 70–99)
Potassium: 4.2 mEq/L (ref 3.5–5.1)
Sodium: 137 mEq/L (ref 135–145)
Total Bilirubin: 0.6 mg/dL (ref 0.2–1.2)
Total Protein: 6.6 g/dL (ref 6.0–8.3)

## 2021-10-01 LAB — HEMOGLOBIN A1C: Hgb A1c MFr Bld: 7.5 % — ABNORMAL HIGH (ref 4.6–6.5)

## 2021-10-01 LAB — LIPID PANEL
Cholesterol: 166 mg/dL (ref 0–200)
HDL: 43.1 mg/dL (ref >39.00)
NonHDL: 123.39
Total CHOL/HDL Ratio: 4
Triglycerides: 218 mg/dL — ABNORMAL HIGH (ref 0.0–149.0)
VLDL: 43.6 mg/dL — ABNORMAL HIGH (ref 0.0–40.0)

## 2021-10-01 LAB — MICROALBUMIN / CREATININE URINE RATIO
Creatinine,U: 54.6 mg/dL
Microalb Creat Ratio: 1.3 mg/g (ref 0.0–30.0)
Microalb, Ur: <0.7 mg/dL (ref 0.0–1.9)

## 2021-10-01 LAB — LDL CHOLESTEROL, DIRECT: Direct LDL: 85 mg/dL

## 2021-10-02 ENCOUNTER — Other Ambulatory Visit: Payer: Self-pay | Admitting: Family Medicine

## 2021-10-02 DIAGNOSIS — E1165 Type 2 diabetes mellitus with hyperglycemia: Secondary | ICD-10-CM

## 2021-10-02 DIAGNOSIS — E785 Hyperlipidemia, unspecified: Secondary | ICD-10-CM

## 2021-10-02 DIAGNOSIS — I10 Essential (primary) hypertension: Secondary | ICD-10-CM

## 2021-10-06 ENCOUNTER — Ambulatory Visit
Admission: RE | Admit: 2021-10-06 | Discharge: 2021-10-06 | Disposition: A | Discharge status: Home / Self Care | Payer: Medicare Other | Source: Ambulatory Visit | Attending: Family Medicine | Admitting: Family Medicine

## 2021-10-06 DIAGNOSIS — C50811 Malignant neoplasm of overlapping sites of right female breast: Secondary | ICD-10-CM | POA: Diagnosis not present

## 2021-10-06 DIAGNOSIS — N6310 Unspecified lump in the right breast, unspecified quadrant: Secondary | ICD-10-CM

## 2021-10-06 DIAGNOSIS — N6311 Unspecified lump in the right breast, upper outer quadrant: Secondary | ICD-10-CM | POA: Diagnosis not present

## 2021-10-12 ENCOUNTER — Telehealth: Payer: Self-pay | Admitting: Family Medicine

## 2021-10-12 DIAGNOSIS — C50911 Malignant neoplasm of unspecified site of right female breast: Secondary | ICD-10-CM

## 2021-10-12 DIAGNOSIS — N6310 Unspecified lump in the right breast, unspecified quadrant: Secondary | ICD-10-CM

## 2021-10-12 NOTE — Telephone Encounter (Signed)
Referral done

## 2021-10-12 NOTE — Telephone Encounter (Signed)
Received fax request to fax referral to Attn:Jill Reuille RN at [place] over referral/received fax confirmation Attn: Dr. Elta Guadeloupe Arrendondo.

## 2021-10-12 NOTE — Addendum Note (Signed)
Addended by: Sharon Seller B on: 10/12/2021 03:38 PM   Modules accepted: Orders

## 2021-10-12 NOTE — Telephone Encounter (Signed)
Karen Pimple R. ([facility]) called requesting a referral be sent for the pt to surgical oncology. She will be sending over info via fax.

## 2021-10-16 DIAGNOSIS — Z803 Family history of malignant neoplasm of breast: Secondary | ICD-10-CM | POA: Diagnosis not present

## 2021-10-16 DIAGNOSIS — C50211 Malignant neoplasm of upper-inner quadrant of right female breast: Secondary | ICD-10-CM | POA: Diagnosis not present

## 2021-10-16 DIAGNOSIS — Z170 Estrogen receptor positive status [ER+]: Secondary | ICD-10-CM | POA: Diagnosis not present

## 2021-10-22 DIAGNOSIS — C50111 Malignant neoplasm of central portion of right female breast: Secondary | ICD-10-CM | POA: Diagnosis not present

## 2021-10-22 DIAGNOSIS — C50211 Malignant neoplasm of upper-inner quadrant of right female breast: Secondary | ICD-10-CM | POA: Diagnosis not present

## 2021-10-22 DIAGNOSIS — C50911 Malignant neoplasm of unspecified site of right female breast: Secondary | ICD-10-CM | POA: Diagnosis not present

## 2021-10-22 DIAGNOSIS — Z170 Estrogen receptor positive status [ER+]: Secondary | ICD-10-CM | POA: Diagnosis not present

## 2021-10-23 DIAGNOSIS — R928 Other abnormal and inconclusive findings on diagnostic imaging of breast: Secondary | ICD-10-CM | POA: Diagnosis not present

## 2021-10-26 DIAGNOSIS — C50811 Malignant neoplasm of overlapping sites of right female breast: Secondary | ICD-10-CM | POA: Diagnosis not present

## 2021-10-28 DIAGNOSIS — C50211 Malignant neoplasm of upper-inner quadrant of right female breast: Secondary | ICD-10-CM | POA: Diagnosis not present

## 2021-10-29 DIAGNOSIS — C50211 Malignant neoplasm of upper-inner quadrant of right female breast: Secondary | ICD-10-CM | POA: Diagnosis not present

## 2021-10-29 DIAGNOSIS — R928 Other abnormal and inconclusive findings on diagnostic imaging of breast: Secondary | ICD-10-CM | POA: Diagnosis not present

## 2021-10-29 DIAGNOSIS — C50911 Malignant neoplasm of unspecified site of right female breast: Secondary | ICD-10-CM | POA: Diagnosis not present

## 2021-10-29 DIAGNOSIS — Z170 Estrogen receptor positive status [ER+]: Secondary | ICD-10-CM | POA: Diagnosis not present

## 2021-11-10 DIAGNOSIS — Z170 Estrogen receptor positive status [ER+]: Secondary | ICD-10-CM | POA: Diagnosis not present

## 2021-11-10 DIAGNOSIS — C50211 Malignant neoplasm of upper-inner quadrant of right female breast: Secondary | ICD-10-CM | POA: Diagnosis not present

## 2021-11-13 DIAGNOSIS — C50211 Malignant neoplasm of upper-inner quadrant of right female breast: Secondary | ICD-10-CM | POA: Diagnosis not present

## 2021-11-13 DIAGNOSIS — Z803 Family history of malignant neoplasm of breast: Secondary | ICD-10-CM | POA: Diagnosis not present

## 2021-11-13 DIAGNOSIS — Z800 Family history of malignant neoplasm of digestive organs: Secondary | ICD-10-CM | POA: Diagnosis not present

## 2021-11-13 DIAGNOSIS — Z170 Estrogen receptor positive status [ER+]: Secondary | ICD-10-CM | POA: Diagnosis not present

## 2021-11-17 ENCOUNTER — Other Ambulatory Visit (INDEPENDENT_AMBULATORY_CARE_PROVIDER_SITE_OTHER): Payer: Medicare Other

## 2021-11-17 DIAGNOSIS — E785 Hyperlipidemia, unspecified: Secondary | ICD-10-CM | POA: Diagnosis not present

## 2021-11-17 DIAGNOSIS — E1165 Type 2 diabetes mellitus with hyperglycemia: Secondary | ICD-10-CM

## 2021-11-17 DIAGNOSIS — I10 Essential (primary) hypertension: Secondary | ICD-10-CM | POA: Diagnosis not present

## 2021-11-17 LAB — CBC
HCT: 41.7 % (ref 36.0–46.0)
Hemoglobin: 13.3 g/dL (ref 12.0–15.0)
MCHC: 32 g/dL (ref 30.0–36.0)
MCV: 85.3 fl (ref 78.0–100.0)
Platelets: 250 10*3/uL (ref 150.0–400.0)
RBC: 4.89 Mil/uL (ref 3.87–5.11)
RDW: 13.4 % (ref 11.5–15.5)
WBC: 6 10*3/uL (ref 4.0–10.5)

## 2021-11-17 LAB — LIPID PANEL
Cholesterol: 148 mg/dL (ref 0–200)
HDL: 42.4 mg/dL (ref >39.00)
LDL Cholesterol: 69 mg/dL (ref 0–99)
NonHDL: 106
Total CHOL/HDL Ratio: 4
Triglycerides: 186 mg/dL — ABNORMAL HIGH (ref 0.0–149.0)
VLDL: 37.2 mg/dL (ref 0.0–40.0)

## 2021-11-17 LAB — HEPATIC FUNCTION PANEL
ALT: 33 U/L (ref 0–35)
AST: 34 U/L (ref 0–37)
Albumin: 3.8 g/dL (ref 3.5–5.2)
Alkaline Phosphatase: 218 U/L — ABNORMAL HIGH (ref 39–117)
Bilirubin, Direct: 0.1 mg/dL (ref 0.0–0.3)
Total Bilirubin: 0.5 mg/dL (ref 0.2–1.2)
Total Protein: 6.5 g/dL (ref 6.0–8.3)

## 2021-11-18 ENCOUNTER — Encounter: Payer: Self-pay | Admitting: Family Medicine

## 2021-11-18 ENCOUNTER — Other Ambulatory Visit: Payer: Self-pay | Admitting: Family Medicine

## 2021-11-18 DIAGNOSIS — M8589 Other specified disorders of bone density and structure, multiple sites: Secondary | ICD-10-CM | POA: Diagnosis not present

## 2021-11-18 DIAGNOSIS — Z780 Asymptomatic menopausal state: Secondary | ICD-10-CM | POA: Diagnosis not present

## 2021-11-18 DIAGNOSIS — M810 Age-related osteoporosis without current pathological fracture: Secondary | ICD-10-CM | POA: Diagnosis not present

## 2021-11-18 DIAGNOSIS — E6609 Other obesity due to excess calories: Secondary | ICD-10-CM

## 2021-11-19 ENCOUNTER — Other Ambulatory Visit: Payer: Self-pay | Admitting: Family Medicine

## 2021-11-20 MED ORDER — ZOLPIDEM TARTRATE ER 6.25 MG PO TBCR: EXTENDED_RELEASE_TABLET | ORAL | 0 refills | Status: DC

## 2021-11-20 NOTE — Telephone Encounter (Signed)
Last OV--09/29/2021 Last RF--08/24/2021  #30 no refills Next scheduled appt with PCP on 03/23/2022

## 2021-11-26 ENCOUNTER — Encounter: Payer: Self-pay | Admitting: Family Medicine

## 2021-11-26 DIAGNOSIS — Z170 Estrogen receptor positive status [ER+]: Secondary | ICD-10-CM | POA: Diagnosis not present

## 2021-11-26 DIAGNOSIS — C50211 Malignant neoplasm of upper-inner quadrant of right female breast: Secondary | ICD-10-CM | POA: Diagnosis not present

## 2021-11-26 DIAGNOSIS — M1812 Unilateral primary osteoarthritis of first carpometacarpal joint, left hand: Secondary | ICD-10-CM | POA: Diagnosis not present

## 2021-11-26 DIAGNOSIS — R053 Chronic cough: Secondary | ICD-10-CM | POA: Diagnosis not present

## 2021-11-27 ENCOUNTER — Other Ambulatory Visit: Payer: Self-pay | Admitting: Family Medicine

## 2021-11-27 DIAGNOSIS — I10 Essential (primary) hypertension: Secondary | ICD-10-CM

## 2021-12-02 ENCOUNTER — Encounter: Payer: Self-pay | Admitting: Family Medicine

## 2021-12-03 ENCOUNTER — Ambulatory Visit (INDEPENDENT_AMBULATORY_CARE_PROVIDER_SITE_OTHER): Payer: Medicare Other | Admitting: Bariatrics

## 2021-12-03 ENCOUNTER — Encounter: Payer: Self-pay | Admitting: Bariatrics

## 2021-12-03 ENCOUNTER — Other Ambulatory Visit: Payer: Self-pay | Admitting: Family Medicine

## 2021-12-03 VITALS — Ht 66.0 in | Wt 198.0 lb

## 2021-12-03 DIAGNOSIS — Z6831 Body mass index (BMI) 31.0-31.9, adult: Secondary | ICD-10-CM

## 2021-12-03 DIAGNOSIS — E6609 Other obesity due to excess calories: Secondary | ICD-10-CM

## 2021-12-03 DIAGNOSIS — E1165 Type 2 diabetes mellitus with hyperglycemia: Secondary | ICD-10-CM

## 2021-12-03 DIAGNOSIS — I10 Essential (primary) hypertension: Secondary | ICD-10-CM

## 2021-12-03 NOTE — Progress Notes (Signed)
Office: [phone]  /  Fax: [phone]  Initial Visit  Karen Hall was seen in clinic today to evaluate for obesity. She is interested in losing weight to improve overall health and reduce the risk of weight related complications.   She presents today to review program treatment options, initial physical assessment, and evaluation.      Past medical history includes:   Past Medical History:  Diagnosis Date   Allergy    ANXIETY 10/18/2007   Qualifier: Diagnosis of  By: Sarajane Jews MD, Ishmael Holter    Autoimmune hepatitis [facility])    Cataract    "beginnings of"   Chronic low back pain    GERD 10/18/2007   Qualifier: Diagnosis of  By: Sherlynn Stalls, CMA, [place]     Hyperlipemia 03/12/2014   HYPERTENSION 10/18/2007   Qualifier: Diagnosis of  By: Sherlynn Stalls, [place], [place]     Insomnia 03/12/2014   Irritable bowel syndrome 10/18/2007   Qualifier: Diagnosis of  By: Sarajane Jews MD, Ishmael Holter    Osteopenia    Recurrent knee pain    R knee hx meniscal tear 2002 with repair   Type 2 diabetes mellitus with hyperglycemia (HCC)      Objective:   Ht '5\' 6"'$  (1.676 m)   Wt 198 lb (89.8 kg)   BMI 31.96 kg/m  She was weighed on the bioimpedance scale:  Body mass index is 31.96 kg/m.  General:  Alert, oriented and cooperative. Patient is in no acute distress.  Respiratory: Normal respiratory effort, no problems with respiration noted  Extremities: Normal range of motion.    Mental Status: Normal mood and affect. Normal behavior. Normal judgment and thought content.   Assessment and Plan:  1. Type 2 diabetes mellitus with hyperglycemia, without long-term current use of insulin ([place])  2. Essential hypertension  3. Class 1 obesity due to excess calories with serious comorbidity and body mass index (BMI) of 31.0 to 31.9 in adult      Obesity Treatment Plan:  She will work on garnering support from family and friends to begin weight loss journey. Work on eliminating or reducing the presence of highly processed,  calorie dense foods in the home. Complete provided nutritional and psychosocial assessment questionnaire.    Karen Hall may follow up in the next 1-2 weeks to review the above steps and continue evaluation and treatment. She will call if she decides to make an appointment.   Obesity Education Performed Today:  She was weighed on the bioimpedance scale and results were discussed and documented in the synopsis.  We discussed obesity as a disease and the importance of a more detailed evaluation of all the factors contributing to the disease.  We discussed the importance of long term lifestyle changes which include nutrition, exercise and behavioral modifications as well as the importance of customizing this to her specific health and social needs.  We discussed the benefits of reaching a healthier weight to alleviate the symptoms of existing conditions and reduce the risks of the biomechanical, metabolic and psychological effects of obesity.  We discussed the goals of this program is to improve her overall health and not simply achieve a specific BMI.  Frequent visits are very important to patient success. I plan to see her every 2 weeks for the first 3 months and then evaluate the visit frequency after that time. I explained obesity is a life-long chronic disease and long term treatments would be required. Medications to help her follow his eating plan may be  offered as appropriate but are not required. All medication decisions will be made together after the initial workup is done and benefits and side effects are discussed in depth.  The clinic rules were reviewed including the late policy, cancellation policy, no show and program fees.  Karen Hall appears to be in the action stage of change and states they are ready to start intensive lifestyle modifications and behavioral modifications.  25 minutes was spent today on this visit including the above counseling, pre-visit chart  review, and post-visit documentation.  Jearld Lesch, DO

## 2021-12-18 ENCOUNTER — Other Ambulatory Visit: Payer: Self-pay | Admitting: Family Medicine

## 2021-12-26 ENCOUNTER — Other Ambulatory Visit: Payer: Self-pay | Admitting: Family Medicine

## 2021-12-26 DIAGNOSIS — J302 Other seasonal allergic rhinitis: Secondary | ICD-10-CM

## 2022-01-11 ENCOUNTER — Other Ambulatory Visit: Payer: Self-pay | Admitting: Family Medicine

## 2022-01-11 MED ORDER — ZOLPIDEM TARTRATE ER 6.25 MG PO TBCR: 6.2500 mg | EXTENDED_RELEASE_TABLET | Freq: Every evening | ORAL | 3 refills | Status: DC | PRN

## 2022-01-11 NOTE — Telephone Encounter (Signed)
Requesting: zolpidem CR 6.'25mg'$  Contract: none UDS: none Last Visit: 09/29/21 Next Visit: 04/12/22 Last Refill: 11/20/21  Please Advise

## 2022-03-11 ENCOUNTER — Encounter: Payer: Self-pay | Admitting: Family Medicine

## 2022-03-12 ENCOUNTER — Telehealth: Payer: Self-pay

## 2022-03-12 ENCOUNTER — Other Ambulatory Visit: Payer: Self-pay | Admitting: Family Medicine

## 2022-03-12 MED ORDER — RAMELTEON 8 MG PO TABS: 8.0000 mg | ORAL_TABLET | Freq: Every day | ORAL | 5 refills | Status: DC

## 2022-03-12 MED ORDER — ZOLPIDEM TARTRATE ER 6.25 MG PO TBCR: 6.2500 mg | EXTENDED_RELEASE_TABLET | Freq: Every evening | ORAL | 3 refills | Status: DC | PRN

## 2022-03-12 NOTE — Telephone Encounter (Signed)
PA denied. Preferred alternatives:   You need to try one (1) of these covered drugs: (a) Belsomra. (b) Ramelteon.  Request Reference Number: [id number]. ZOLPIDEM ER TAB 6.'25MG'$  is denied for not meeting the prior authorization requirement(s). Details of this decision are in the notice attached below or have been faxed to you.

## 2022-03-12 NOTE — Telephone Encounter (Signed)
PA initiated via Covermymeds; KEY: [id number]. Awaiting determination.

## 2022-03-17 ENCOUNTER — Telehealth: Payer: Self-pay | Admitting: Family Medicine

## 2022-03-17 MED ORDER — ZOLPIDEM TARTRATE ER 6.25 MG PO TBCR: 6.2500 mg | EXTENDED_RELEASE_TABLET | Freq: Every evening | ORAL | 5 refills | Status: DC | PRN

## 2022-03-17 NOTE — Telephone Encounter (Signed)
Patient called to advise that she cannot afford the ramelteon (ROZEREM) 8 MG tablet at $100. She is requesting Ambien to be called into [facility] on [address] in [place].

## 2022-04-06 ENCOUNTER — Encounter: Payer: Self-pay | Admitting: Family Medicine

## 2022-04-06 DIAGNOSIS — Z79899 Other long term (current) drug therapy: Secondary | ICD-10-CM

## 2022-04-06 DIAGNOSIS — I10 Essential (primary) hypertension: Secondary | ICD-10-CM

## 2022-04-06 DIAGNOSIS — E785 Hyperlipidemia, unspecified: Secondary | ICD-10-CM

## 2022-04-06 DIAGNOSIS — E1165 Type 2 diabetes mellitus with hyperglycemia: Secondary | ICD-10-CM

## 2022-04-07 ENCOUNTER — Ambulatory Visit (INDEPENDENT_AMBULATORY_CARE_PROVIDER_SITE_OTHER): Payer: Medicare Other

## 2022-04-07 VITALS — Wt 198.0 lb

## 2022-04-07 DIAGNOSIS — Z0000 Encounter for general adult medical examination without abnormal findings: Secondary | ICD-10-CM | POA: Diagnosis not present

## 2022-04-07 NOTE — Progress Notes (Signed)
I connected with  [name] on 04/07/22 by a audio enabled telemedicine application and verified that I am speaking with the correct person using two identifiers.  Patient Location: Home  Provider Location: Home Office  I discussed the limitations of evaluation and management by telemedicine. The patient expressed understanding and agreed to proceed.   Subjective:   Karen Hall is a 67 y.o. female who presents for an Initial Medicare Annual Wellness Visit.  Review of Systems     Cardiac Risk Factors include: advanced age (>23mn, >>50women);hypertension;obesity (BMI >30kg/m2);dyslipidemia;diabetes mellitus     Objective:    Today's Vitals   04/07/22 1143  Weight: 198 lb (89.8 kg)   Body mass index is 31.96 kg/m.     04/07/2022   11:50 AM 10/26/2019    2:13 PM 06/27/2019    4:09 PM 06/27/2019    7:51 AM 06/16/2019    5:46 PM 06/16/2019    9:32 AM 02/24/2018   11:11 AM  Advanced Directives  Does Patient Have a Medical Advance Directive? Yes No  Yes Yes Yes No  Type of AParamedicof [place]Living will  [place] will HAspersLiving will Healthcare Power of ANorthwest ArcticLiving will   Does patient want to make changes to medical advance directive?   No - Patient declined  No - Patient declined No - Patient declined   Copy of [place] Chart? No - copy requested   No - copy requested No - copy requested No - copy requested   Would patient like information on creating a medical advance directive?     No - Patient declined No - Patient declined No - Patient declined    Current Medications (verified) Outpatient Encounter Medications as of 04/07/2022  Medication Sig   atorvastatin (LIPITOR) 10 MG tablet Take 1 tablet (10 mg total) by mouth daily.   Cholecalciferol (VITAMIN D3) 125 MCG (5000 UT) CAPS Take 1 capsule by mouth daily.   citalopram (CELEXA) 20 MG  tablet TAKE 1 TABLET BY MOUTH  DAILY   Homeopathic Products (ZICAM ALLERGY RELIEF NA) Place 1 application into the nose 2 (two) times daily as needed (congestion/allergies).    Melatonin 5 MG CAPS Take 20 mg by mouth.   Menthol, Topical Analgesic, (BIOFREEZE EX) Apply 1 application topically at bedtime as needed (pain).   olmesartan (BENICAR) 20 MG tablet TAKE 1 TABLET BY MOUTH EVERY DAY   Polyethyl Glycol-Propyl Glycol (SYSTANE) 0.4-0.3 % SOLN Place 1 drop into both eyes daily as needed (dry eyes).   simethicone (GAS-X) 80 MG chewable tablet Chew 1 tablet (80 mg total) by mouth 4 (four) times daily as needed (gas/bloating).   zolpidem (AMBIEN CR) 6.25 MG CR tablet Take 1 tablet (6.25 mg total) by mouth at bedtime as needed for sleep.   omeprazole (PRILOSEC) 20 MG capsule Take 1 capsule (20 mg total) by mouth daily.   [DISCONTINUED] montelukast (SINGULAIR) 10 MG tablet TAKE 1 TABLET BY MOUTH EVERYDAY AT BEDTIME   No facility-administered encounter medications on file as of 04/07/2022.    Allergies (verified) Aspartame and phenylalanine and Codeine   History: Past Medical History:  Diagnosis Date   Allergy    ANXIETY 10/18/2007   Qualifier: Diagnosis of  By: FSarajane JewsMD, SIshmael Holter   Autoimmune hepatitis ([facility]    Cataract    "beginnings of"   Chronic low back pain    GERD 10/18/2007  Qualifier: Diagnosis of  By: Sherlynn Stalls, [place], [place]     Hyperlipemia 03/12/2014   HYPERTENSION 10/18/2007   Qualifier: Diagnosis of  By: Sherlynn Stalls, [place], [place]     Insomnia 03/12/2014   Irritable bowel syndrome 10/18/2007   Qualifier: Diagnosis of  By: Sarajane Jews MD, Ishmael Holter    Osteopenia    Recurrent knee pain    R knee hx meniscal tear 2002 with repair   Type 2 diabetes mellitus with hyperglycemia [facility])    Past Surgical History:  Procedure Laterality Date   BREAST EXCISIONAL BIOPSY Left    Fibroid removed, 24 years ago    BREAST SURGERY     fibroid   CATARACT EXTRACTION Bilateral 09/2018   CHOLECYSTECTOMY      COLONOSCOPY     Around 2010   ESOPHAGOGASTRODUODENOSCOPY (EGD) WITH PROPOFOL N/A 06/27/2019   Procedure: ESOPHAGOGASTRODUODENOSCOPY (EGD) WITH PROPOFOL;  Surgeon: Lavena Bullion, DO;  Location: [facility];  Service: Gastroenterology;  Laterality: N/A;   FOOT SURGERY Right    LIVER BIOPSY  02/2018   MALONEY DILATION  06/27/2019   Procedure: MALONEY DILATION;  Surgeon: Lavena Bullion, DO;  Location: [facility];  Service: Gastroenterology;;   MENISCUS REPAIR Right    TONSILLECTOMY     TRANSORAL INCISIONLESS FUNDOPLICATION N/A 7/51/0258   Procedure: TRANSORAL INCISIONLESS FUNDOPLICATION;  Surgeon: Lavena Bullion, DO;  Location: [facility];  Service: Gastroenterology;  Laterality: N/A;   Family History  Problem Relation Age of Onset   Cancer Mother        melanoma   Hyperlipidemia Mother    Breast cancer Mother    Liver cancer Father    Cancer Maternal Grandfather        unknown type   Heart disease Paternal Grandfather    Diabetes Maternal Aunt    Diabetes Sister    Lung disease Neg Hx    Colon cancer Neg Hx    Esophageal cancer Neg Hx    Rectal cancer Neg Hx    Stomach cancer Neg Hx    Social History   Socioeconomic History   Marital status: Widowed    Spouse name: Not on file   Number of children: 0   Years of education: Not on file   Highest education level: Not on file  Occupational History   Not on file  Tobacco Use   Smoking status: Never   Smokeless tobacco: Never  Vaping Use   Vaping Use: Never used  Substance and Sexual Activity   Alcohol use: Not Currently   Drug use: No   Sexual activity: Not on file  Other Topics Concern   Not on file  Social History Narrative   Work or School: Press photographer - [employer]       Home Situation: lives alone      Spiritual Beliefs: none      Lifestyle: no regular exercise; poor diet      [facility]:   Originally from [place]. She has lived in [place], [place], [place], [place]. She has a  dog currently. No bird exposure. No hot tub exposure. No mold exposure. Currently works as a Adult nurse.       Social Determinants of Health   Financial Resource Strain: Low Risk  (04/07/2022)   Overall Financial Resource Strain (CARDIA)    Difficulty of Paying Living Expenses: Not hard at all  Food Insecurity: No Food Insecurity (04/07/2022)   Hunger Vital Sign    Worried About Running Out of Food  in the Last Year: Never true    [place] in the Last Year: Never true  Transportation Needs: No Transportation Needs (04/07/2022)   PRAPARE - Hydrologist (Medical): No    Lack of Transportation (Non-Medical): No  Physical Activity: Not on file  Stress: No Stress Concern Present (04/07/2022)   [place]    Feeling of Stress : Not at all  Social Connections: Not on file    Tobacco Counseling Counseling given: Not Answered   Clinical Intake:  Pre-visit preparation completed: Yes  Pain : No/denies pain     BMI - recorded: 31.96 Nutritional Status: BMI > 30  Obese Nutritional Risks: None Diabetes: Yes CBG done?: No Did pt. bring in CBG monitor from home?: No  How often do you need to have someone help you when you read instructions, pamphlets, or other written materials from your doctor or pharmacy?: 1 - Never  Diabetic?Nutrition Risk Assessment:  Has the patient had any N/V/D within the last 2 months?  No  Does the patient have any non-healing wounds?  No  Has the patient had any unintentional weight loss or weight gain?  No   Diabetes:  Is the patient diabetic?  Yes  If diabetic, was a CBG obtained today?  No  Did the patient bring in their glucometer from home?  No  How often do you monitor your CBG's? N/A.   Financial Strains and Diabetes Management:  Are you having any financial strains with the device, your supplies or your medication? No .  Does the patient  want to be seen by Chronic Care Management for management of their diabetes?  No  Would the patient like to be referred to a Nutritionist or for Diabetic Management?  No   Diabetic Exams:  Diabetic Eye Exam: Completed 07/30/21 Diabetic Foot Exam: Completed 09/29/21  Interpreter Needed?: No  Information entered by :: Charlott Rakes, LPN   Activities of Daily Living    04/06/2022   10:47 AM  In your present state of health, do you have any difficulty performing the following activities:  Hearing? 0  Vision? 0  Difficulty concentrating or making decisions? 0  Walking or climbing stairs? 0  Dressing or bathing? 0  Doing errands, shopping? 0  Preparing Food and eating ? N  Using the Toilet? N  In the past six months, have you accidently leaked urine? N  Do you have problems with loss of bowel control? N  Managing your Medications? N  Managing your Finances? N  Housekeeping or managing your Housekeeping? N    Patient Care Team: Shelda Pal, DO as PCP - General (Family Medicine)  Indicate any recent Medical Services you may have received from other than [facility] providers in the past year (date may be approximate).     Assessment:   This is a routine wellness examination for Riyanshi.  Hearing/Vision screen Hearing Screening - Comments:: Pt denies any hearing issues  Vision Screening - Comments:: Pt follows up with Dr Katy Fitch for annual eye exams   Dietary issues and exercise activities discussed: Current Exercise Habits: Home exercise routine, Type of exercise: walking, Time (Minutes): 10, Frequency (Times/Week): 2, Weekly Exercise (Minutes/Week): 20   Goals Addressed             This Visit's Progress    Patient Stated       Lose weight       Depression  Screen    04/07/2022   11:49 AM 08/28/2021    2:59 PM 08/12/2020    1:51 PM 04/12/2017   11:19 AM 04/08/2015    9:22 AM 02/17/2015    7:52 PM 02/11/2015   12:13 PM  PHQ 2/9 Scores  PHQ - 2 Score 0 0 0 0  0 0 0    Fall Risk    04/07/2022   11:50 AM 04/06/2022   10:47 AM 08/28/2021    2:59 PM 08/12/2020    1:51 PM  [place] in the past year? '1 1 1 1  '$ Number falls in past yr: 1 0 1 0  Injury with Fall? 0 0 1 1  Comment   brusing   Risk for fall due to : Impaired vision  Impaired balance/gait No Fall Risks  Risk for fall due to: Comment   Blood pressure has been low recently   Follow up Falls prevention discussed  Falls evaluation completed Falls evaluation completed    [place]:  Any stairs in or around the home? Yes  If so, are there any without handrails? No  Home free of loose throw rugs in walkways, pet beds, electrical cords, etc? Yes  Adequate lighting in your home to reduce risk of falls? Yes   ASSISTIVE DEVICES UTILIZED TO PREVENT FALLS:  Life alert? No  Use of a cane, walker or w/c? No  Grab bars in the bathroom? Yes  Shower chair or bench in shower? Yes  Elevated toilet seat or a handicapped toilet? No   TIMED UP AND GO:  Was the test performed? No .   Cognitive Function: pt declined at this time       11/03/2020    2:00 PM  [facility] Cognitive Assessment   Visuospatial/ Executive (0/5) 4  Naming (0/3) 3  Attention: Read list of digits (0/2) 2  Attention: Read list of letters (0/1) 1  Attention: Serial 7 subtraction starting at 100 (0/3) 2  Language: Repeat phrase (0/2) 2  Language : Fluency (0/1) 0  Abstraction (0/2) 1  Delayed Recall (0/5) 2  Orientation (0/6) 6  Total 23  Adjusted Score (based on education) 24      Immunizations Immunization History  Administered Date(s) Administered   Hep A / Hep B 02/17/2018, 03/21/2018   Influenza,inj,Quad PF,6+ Mos 11/29/2014   Janssen (J&J) SARS-COV-2 Vaccination 08/08/2019   Pneumococcal Polysaccharide-23 05/01/2015   Tdap 03/12/2014   Zoster, Live 04/22/2014    TDAP status: Up to date  Flu Vaccine status: Declined, Education has been provided regarding the  importance of this vaccine but patient still declined. Advised may receive this vaccine at local pharmacy or Health Dept. Aware to provide a copy of the vaccination record if obtained from local pharmacy or Health Dept. Verbalized acceptance and understanding.  Pneumococcal vaccine status: Declined,  Education has been provided regarding the importance of this vaccine but patient still declined. Advised may receive this vaccine at local pharmacy or Health Dept. Aware to provide a copy of the vaccination record if obtained from local pharmacy or Health Dept. Verbalized acceptance and understanding.   Covid-19 vaccine status: Completed vaccines  Qualifies for Shingles Vaccine? Yes   Zostavax completed No   Shingrix Completed?: No.    Education has been provided regarding the importance of this vaccine. Patient has been advised to call insurance company to determine out of pocket expense if they have not yet received this vaccine. Advised  may also receive vaccine at local pharmacy or Health Dept. Verbalized acceptance and understanding.  Screening Tests Health Maintenance  Topic Date Due   COLONOSCOPY (Pts 45-21yr Insurance coverage will need to be confirmed)  03/15/2016   COVID-19 Vaccine (2 - Janssen risk series) 09/05/2019   HEMOGLOBIN A1C  04/03/2022   INFLUENZA VACCINE  02/23/2026 (Originally 10/13/2021)   OPHTHALMOLOGY EXAM  07/31/2022   FOOT EXAM  09/30/2022   Diabetic kidney evaluation - eGFR measurement  10/02/2022   Diabetic kidney evaluation - Urine ACR  10/02/2022   MAMMOGRAM  03/28/2023   Medicare Annual Wellness (AWV)  04/08/2023   DTaP/Tdap/Td (2 - Td or Tdap) 03/12/2024   DEXA SCAN  Completed   Hepatitis C Screening  Completed   HPV VACCINES  Aged Out   Pneumonia Vaccine 67 Years old  Discontinued   Zoster Vaccines- Shingrix  Discontinued    Health Maintenance  Health Maintenance Due  Topic Date Due   COLONOSCOPY (Pts 45-485yrInsurance coverage will need to be  confirmed)  03/15/2016   COVID-19 Vaccine (2 - Janssen risk series) 09/05/2019   HEMOGLOBIN A1C  04/03/2022    Colorectal cancer screening: No longer required.  Pt stated cologuard only   Mammogram status: Completed 10/01/21. Repeat every year  Bone Density status: Completed 01/10/18. Results reflect: Bone density results: OSTEOPENIA. Repeat every 2 years.   Additional Screening:  Hepatitis C Screening:  Completed 02/01/18  Vision Screening: Recommended annual ophthalmology exams for early detection of glaucoma and other disorders of the eye. Is the patient up to date with their annual eye exam?  Yes  Who is the provider or what is the name of the office in which the patient attends annual eye exams? Dr GrKaty FitchIf pt is not established with a provider, would they like to be referred to a provider to establish care? No .   Dental Screening: Recommended annual dental exams for proper oral hygiene  Community Resource Referral / Chronic Care Management: CRR required this visit?  No   CCM required this visit?  No      Plan:     I have personally reviewed and noted the following in the patient's chart:   Medical and social history Use of alcohol, tobacco or illicit drugs  Current medications and supplements including opioid prescriptions. Patient is not currently taking opioid prescriptions. Functional ability and status Nutritional status Physical activity Advanced directives List of other physicians Hospitalizations, surgeries, and ER visits in previous 12 months Vitals Screenings to include cognitive, depression, and falls Referrals and appointments  In addition, I have reviewed and discussed with patient certain preventive protocols, quality metrics, and best practice recommendations. A written personalized care plan for preventive services as well as general preventive health recommendations were provided to patient.     TiWillette BraceLPN   03/22/86/9169 Nurse  Notes:Pt declined cognition test at this time, pt was knowledgeable of answering questions.

## 2022-04-07 NOTE — Patient Instructions (Signed)
Karen Hall , Thank you for taking time to come for your Medicare Wellness Visit. I appreciate your ongoing commitment to your health goals. Please review the following plan we discussed and let me know if I can assist you in the future.   These are the goals we discussed:  Goals      Patient Stated     Lose weight         This is a list of the screening recommended for you and due dates:  Health Maintenance  Topic Date Due   Colon Cancer Screening  03/15/2016   COVID-19 Vaccine (2 - Janssen risk series) 09/05/2019   Hemoglobin A1C  04/03/2022   Flu Shot  02/23/2026*   Eye exam for diabetics  07/31/2022   Complete foot exam   09/30/2022   Yearly kidney function blood test for diabetes  10/02/2022   Yearly kidney health urinalysis for diabetes  10/02/2022   Mammogram  03/28/2023   Medicare Annual Wellness Visit  04/08/2023   DTaP/Tdap/Td vaccine (2 - Td or Tdap) 03/12/2024   DEXA scan (bone density measurement)  Completed   Hepatitis C Screening: USPSTF Recommendation to screen - Ages 49-79 yo.  Completed   HPV Vaccine  Aged Out   Pneumonia Vaccine  Discontinued   Zoster (Shingles) Vaccine  Discontinued  *Topic was postponed. The date shown is not the original due date.    Advanced directives: Please bring a copy of your health care power of attorney and living will to the office at your convenience.  Conditions/risks identified: lose weight   Next appointment: Follow up in one year for your annual wellness visit    Preventive Care 65 Years and Older, Female Preventive care refers to lifestyle choices and visits with your health care provider that can promote health and wellness. What does preventive care include? A yearly physical exam. This is also called an annual well check. Dental exams once or twice a year. Routine eye exams. Ask your health care provider how often you should have your eyes checked. Personal lifestyle choices, including: Daily care of your teeth and  gums. Regular physical activity. Eating a healthy diet. Avoiding tobacco and drug use. Limiting alcohol use. Practicing safe sex. Taking low-dose aspirin every day. Taking vitamin and mineral supplements as recommended by your health care provider. What happens during an annual well check? The services and screenings done by your health care provider during your annual well check will depend on your age, overall health, lifestyle risk factors, and family history of disease. Counseling  Your health care provider may ask you questions about your: Alcohol use. Tobacco use. Drug use. Emotional well-being. Home and relationship well-being. Sexual activity. Eating habits. History of falls. Memory and ability to understand (cognition). Work and work Statistician. Reproductive health. Screening  You may have the following tests or measurements: Height, weight, and BMI. Blood pressure. Lipid and cholesterol levels. These may be checked every 5 years, or more frequently if you are over 71 years old. Skin check. Lung cancer screening. You may have this screening every year starting at age 41 if you have a 30-pack-year history of smoking and currently smoke or have quit within the past 15 years. Fecal occult blood test (FOBT) of the stool. You may have this test every year starting at age 62. Flexible sigmoidoscopy or colonoscopy. You may have a sigmoidoscopy every 5 years or a colonoscopy every 10 years starting at age 55. Hepatitis C blood test. Hepatitis B blood  test. Sexually transmitted disease (STD) testing. Diabetes screening. This is done by checking your blood sugar (glucose) after you have not eaten for a while (fasting). You may have this done every 1-3 years. Bone density scan. This is done to screen for osteoporosis. You may have this done starting at age 73. Mammogram. This may be done every 1-2 years. Talk to your health care provider about how often you should have regular  mammograms. Talk with your health care provider about your test results, treatment options, and if necessary, the need for more tests. Vaccines  Your health care provider may recommend certain vaccines, such as: Influenza vaccine. This is recommended every year. Tetanus, diphtheria, and acellular pertussis (Tdap, Td) vaccine. You may need a Td booster every 10 years. Zoster vaccine. You may need this after age 59. Pneumococcal 13-valent conjugate (PCV13) vaccine. One dose is recommended after age 87. Pneumococcal polysaccharide (PPSV23) vaccine. One dose is recommended after age 48. Talk to your health care provider about which screenings and vaccines you need and how often you need them. This information is not intended to replace advice given to you by your health care provider. Make sure you discuss any questions you have with your health care provider. Document Released: 03/28/2015 Document Revised: 11/19/2015 Document Reviewed: 12/31/2014 Elsevier Interactive Patient Education  2017 [place] Prevention in the Home Falls can cause injuries. They can happen to people of all ages. There are many things you can do to make your home safe and to help prevent falls. What can I do on the outside of my home? Regularly fix the edges of walkways and driveways and fix any cracks. Remove anything that might make you trip as you walk through a door, such as a raised step or threshold. Trim any bushes or trees on the path to your home. Use bright outdoor lighting. Clear any walking paths of anything that might make someone trip, such as rocks or tools. Regularly check to see if handrails are loose or broken. Make sure that both sides of any steps have handrails. Any raised decks and porches should have guardrails on the edges. Have any leaves, snow, or ice cleared regularly. Use sand or salt on walking paths during winter. Clean up any spills in your garage right away. This includes oil  or grease spills. What can I do in the bathroom? Use night lights. Install grab bars by the toilet and in the tub and shower. Do not use towel bars as grab bars. Use non-skid mats or decals in the tub or shower. If you need to sit down in the shower, use a plastic, non-slip stool. Keep the floor dry. Clean up any water that spills on the floor as soon as it happens. Remove soap buildup in the tub or shower regularly. Attach bath mats securely with double-sided non-slip rug tape. Do not have throw rugs and other things on the floor that can make you trip. What can I do in the bedroom? Use night lights. Make sure that you have a light by your bed that is easy to reach. Do not use any sheets or blankets that are too big for your bed. They should not hang down onto the floor. Have a firm chair that has side arms. You can use this for support while you get dressed. Do not have throw rugs and other things on the floor that can make you trip. What can I do in the kitchen? Clean up any spills right  away. Avoid walking on wet floors. Keep items that you use a lot in easy-to-reach places. If you need to reach something above you, use a strong step stool that has a grab bar. Keep electrical cords out of the way. Do not use floor polish or wax that makes floors slippery. If you must use wax, use non-skid floor wax. Do not have throw rugs and other things on the floor that can make you trip. What can I do with my stairs? Do not leave any items on the stairs. Make sure that there are handrails on both sides of the stairs and use them. Fix handrails that are broken or loose. Make sure that handrails are as long as the stairways. Check any carpeting to make sure that it is firmly attached to the stairs. Fix any carpet that is loose or worn. Avoid having throw rugs at the top or bottom of the stairs. If you do have throw rugs, attach them to the floor with carpet tape. Make sure that you have a light  switch at the top of the stairs and the bottom of the stairs. If you do not have them, ask someone to add them for you. What else can I do to help prevent falls? Wear shoes that: Do not have high heels. Have rubber bottoms. Are comfortable and fit you well. Are closed at the toe. Do not wear sandals. If you use a stepladder: Make sure that it is fully opened. Do not climb a closed stepladder. Make sure that both sides of the stepladder are locked into place. Ask someone to hold it for you, if possible. Clearly mark and make sure that you can see: Any grab bars or handrails. First and last steps. Where the edge of each step is. Use tools that help you move around (mobility aids) if they are needed. These include: Canes. Walkers. Scooters. Crutches. Turn on the lights when you go into a dark area. Replace any light bulbs as soon as they burn out. Set up your furniture so you have a clear path. Avoid moving your furniture around. If any of your floors are uneven, fix them. If there are any pets around you, be aware of where they are. Review your medicines with your doctor. Some medicines can make you feel dizzy. This can increase your chance of falling. Ask your doctor what other things that you can do to help prevent falls. This information is not intended to replace advice given to you by your health care provider. Make sure you discuss any questions you have with your health care provider. Document Released: 12/26/2008 Document Revised: 08/07/2015 Document Reviewed: 04/05/2014 Elsevier Interactive Patient Education  2017 [employer].

## 2022-04-07 NOTE — Progress Notes (Signed)
I connected with  Karen Hall on 04/07/22 by a audio enabled telemedicine application and verified that I am speaking with the correct person using two identifiers.  Patient Location: Home  Provider Location: Home Office  I discussed the limitations of evaluation and management by telemedicine. The patient expressed understanding and agreed to proceed.   Subjective:   Karen Hall is a 67 y.o. female who presents for an Initial Medicare Annual Wellness Visit.  Review of Systems     Cardiac Risk Factors include: advanced age (>27mn, >>15women);hypertension;obesity (BMI >30kg/m2);dyslipidemia;diabetes mellitus     Objective:    Today's Vitals   04/07/22 1143  Weight: 198 lb (89.8 kg)   Body mass index is 31.96 kg/m.     04/07/2022   11:50 AM 10/26/2019    2:13 PM 06/27/2019    4:09 PM 06/27/2019    7:51 AM 06/16/2019    5:46 PM 06/16/2019    9:32 AM 02/24/2018   11:11 AM  Advanced Directives  Does Patient Have a Medical Advance Directive? Yes No  Yes Yes Yes No  Type of AParamedicof AConcordiaLiving will  HGrandview HeightsLiving will HK[place]Living will Healthcare Power of AMineral WellsLiving will   Does patient want to make changes to medical advance directive?   No - Patient declined  No - Patient declined No - Patient declined   Copy of HM[place] Chart? No - copy requested   No - copy requested No - copy requested No - copy requested   Would patient like information on creating a medical advance directive?     No - Patient declined No - Patient declined No - Patient declined    Current Medications (verified) Outpatient Encounter Medications as of 04/07/2022  Medication Sig   atorvastatin (LIPITOR) 10 MG tablet Take 1 tablet (10 mg total) by mouth daily.   Cholecalciferol (VITAMIN D3) 125 MCG (5000 UT) CAPS Take 1 capsule by mouth daily.   citalopram (CELEXA) 20 MG  tablet TAKE 1 TABLET BY MOUTH  DAILY   Homeopathic Products (ZICAM ALLERGY RELIEF NA) Place 1 application into the nose 2 (two) times daily as needed (congestion/allergies).    Melatonin 5 MG CAPS Take 20 mg by mouth.   Menthol, Topical Analgesic, (BIOFREEZE EX) Apply 1 application topically at bedtime as needed (pain).   olmesartan (BENICAR) 20 MG tablet TAKE 1 TABLET BY MOUTH EVERY DAY   Polyethyl Glycol-Propyl Glycol (SYSTANE) 0.4-0.3 % SOLN Place 1 drop into both eyes daily as needed (dry eyes).   simethicone (GAS-X) 80 MG chewable tablet Chew 1 tablet (80 mg total) by mouth 4 (four) times daily as needed (gas/bloating).   zolpidem (AMBIEN CR) 6.25 MG CR tablet Take 1 tablet (6.25 mg total) by mouth at bedtime as needed for sleep.   omeprazole (PRILOSEC) 20 MG capsule Take 1 capsule (20 mg total) by mouth daily.   [DISCONTINUED] montelukast (SINGULAIR) 10 MG tablet TAKE 1 TABLET BY MOUTH EVERYDAY AT BEDTIME   No facility-administered encounter medications on file as of 04/07/2022.    Allergies (verified) Aspartame and phenylalanine and Codeine   History: Past Medical History:  Diagnosis Date   Allergy    ANXIETY 10/18/2007   Qualifier: Diagnosis of  By: FSarajane JewsMD, SIshmael Holter   Autoimmune hepatitis ([facility]    Cataract    "beginnings of"   Chronic low back pain    GERD 10/18/2007  Qualifier: Diagnosis of  By: Sherlynn Stalls, [place], [place]     Hyperlipemia 03/12/2014   HYPERTENSION 10/18/2007   Qualifier: Diagnosis of  By: Sherlynn Stalls, [place], [place]     Insomnia 03/12/2014   Irritable bowel syndrome 10/18/2007   Qualifier: Diagnosis of  By: Sarajane Jews MD, Ishmael Holter    Osteopenia    Recurrent knee pain    R knee hx meniscal tear 2002 with repair   Type 2 diabetes mellitus with hyperglycemia [facility])    Past Surgical History:  Procedure Laterality Date   BREAST EXCISIONAL BIOPSY Left    Fibroid removed, 24 years ago    BREAST SURGERY     fibroid   CATARACT EXTRACTION Bilateral 09/2018   CHOLECYSTECTOMY      COLONOSCOPY     Around 2010   ESOPHAGOGASTRODUODENOSCOPY (EGD) WITH PROPOFOL N/A 06/27/2019   Procedure: ESOPHAGOGASTRODUODENOSCOPY (EGD) WITH PROPOFOL;  Surgeon: Lavena Bullion, DO;  Location: [facility];  Service: Gastroenterology;  Laterality: N/A;   FOOT SURGERY Right    LIVER BIOPSY  02/2018   MALONEY DILATION  06/27/2019   Procedure: MALONEY DILATION;  Surgeon: Lavena Bullion, DO;  [facility]: [facility];  Service: Gastroenterology;;   MENISCUS REPAIR Right    TONSILLECTOMY     TRANSORAL INCISIONLESS FUNDOPLICATION N/A 9/62/8366   Procedure: TRANSORAL INCISIONLESS FUNDOPLICATION;  Surgeon: Lavena Bullion, DO;  [facility];  Service: Gastroenterology;  Laterality: N/A;   Family History  Problem Relation Age of Onset   Cancer Mother        melanoma   Hyperlipidemia Mother    Breast cancer Mother    Liver cancer Father    Cancer Maternal Grandfather        unknown type   Heart disease Paternal Grandfather    Diabetes Maternal Aunt    Diabetes Sister    Lung disease Neg Hx    Colon cancer Neg Hx    Esophageal cancer Neg Hx    Rectal cancer Neg Hx    Stomach cancer Neg Hx    Social History   Socioeconomic History   Marital status: Widowed    Spouse name: Not on file   Number of children: 0   Years of education: Not on file   Highest education level: Not on file  Occupational History   Not on file  Tobacco Use   Smoking status: Never   Smokeless tobacco: Never  Vaping Use   Vaping Use: Never used  Substance and Sexual Activity   Alcohol use: Not Currently   Drug use: No   Sexual activity: Not on file  Other Topics Concern   Not on file  Social History Narrative   Work or School: Press photographer - [employer]       Home Situation: lives alone      Spiritual Beliefs: none      Lifestyle: no regular exercise; poor diet      [place] [facility]:   Originally from [place]. She has lived in [place], [place], [place], [place]. She has a  dog currently. No bird exposure. No hot tub exposure. No mold exposure. Currently works as a Adult nurse.       Social Determinants of Health   Financial Resource Strain: Low Risk  (04/07/2022)   Overall Financial Resource Strain (CARDIA)    Difficulty of Paying Living Expenses: Not hard at all  Food Insecurity: No Food Insecurity (04/07/2022)   Hunger Vital Sign    Worried About Running Out of Food  in the Last Year: Never true    H[place] in the Last Year: Never true  Transportation Needs: No Transportation Needs (04/07/2022)   PRAPARE - Hydrologist (Medical): No    Lack of Transportation (Non-Medical): No  Physical Activity: Not on file  Stress: No Stress Concern Present (04/07/2022)   [place]    Feeling of Stress : Not at all  Social Connections: Not on file    Tobacco Counseling Counseling given: Not Answered   Clinical Intake:  Pre-visit preparation completed: Yes  Pain : No/denies pain     BMI - recorded: 31.96 Nutritional Status: BMI > 30  Obese Nutritional Risks: None Diabetes: Yes CBG done?: No Did pt. bring in CBG monitor from home?: No  How often do you need to have someone help you when you read instructions, pamphlets, or other written materials from your doctor or pharmacy?: 1 - Never  Diabetic?Nutrition Risk Assessment:  Has the patient had any N/V/D within the last 2 months?  No  Does the patient have any non-healing wounds?  No  Has the patient had any unintentional weight loss or weight gain?  No   Diabetes:  Is the patient diabetic?  Yes  If diabetic, was a CBG obtained today?  No  Did the patient bring in their glucometer from home?  No  How often do you monitor your CBG's? N/A.   Financial Strains and Diabetes Management:  Are you having any financial strains with the device, your supplies or your medication? No .  Does the patient  want to be seen by Chronic Care Management for management of their diabetes?  No  Would the patient like to be referred to a Nutritionist or for Diabetic Management?  No   Diabetic Exams:  Diabetic Eye Exam: Completed 07/30/21 Diabetic Foot Exam: Completed 09/29/21  Interpreter Needed?: No  Information entered by :: Charlott Rakes, LPN   Activities of Daily Living    04/06/2022   10:47 AM  In your present state of health, do you have any difficulty performing the following activities:  Hearing? 0  Vision? 0  Difficulty concentrating or making decisions? 0  Walking or climbing stairs? 0  Dressing or bathing? 0  Doing errands, shopping? 0  Preparing Food and eating ? N  Using the Toilet? N  In the past six months, have you accidently leaked urine? N  Do you have problems with loss of bowel control? N  Managing your Medications? N  Managing your Finances? N  Housekeeping or managing your Housekeeping? N    Patient Care Team: Shelda Pal, DO as PCP - General (Family Medicine)  Indicate any recent Medical Services you may have received from other than [facility] providers in the past year (date may be approximate).     Assessment:   This is a routine wellness examination for Corynn.  Hearing/Vision screen Hearing Screening - Comments:: Pt denies any hearing issues  Vision Screening - Comments:: Pt follows up with Dr Katy Fitch for annual eye exams   Dietary issues and exercise activities discussed: Current Exercise Habits: Home exercise routine, Type of exercise: walking, Time (Minutes): 10, Frequency (Times/Week): 2, Weekly Exercise (Minutes/Week): 20   Goals Addressed             This Visit's Progress    Patient Stated       Lose weight  Depression Screen    04/07/2022   11:49 AM 08/28/2021    2:59 PM 08/12/2020    1:51 PM 04/12/2017   11:19 AM 04/08/2015    9:22 AM 02/17/2015    7:52 PM 02/11/2015   12:13 PM  PHQ 2/9 Scores  PHQ - 2 Score 0 0 0  0 0 0 0    Fall Risk    04/07/2022   11:50 AM 04/06/2022   10:47 AM 08/28/2021    2:59 PM 08/12/2020    1:51 PM  Banks in the past year? '1 1 1 1  '$ Number falls in past yr: 1 0 1 0  Injury with Fall? 0 0 1 1  Comment   brusing   Risk for fall due to : Impaired vision  Impaired balance/gait No Fall Risks  Risk for fall due to: Comment   Blood pressure has been low recently   Follow up Falls prevention discussed  Falls evaluation completed Falls evaluation completed    [place]:  Any stairs in or around the home? Yes  If so, are there any without handrails? No  Home free of loose throw rugs in walkways, pet beds, electrical cords, etc? Yes  Adequate lighting in your home to reduce risk of falls? Yes   ASSISTIVE DEVICES UTILIZED TO PREVENT FALLS:  Life alert? No  Use of a cane, walker or w/c? No  Grab bars in the bathroom? Yes  Shower chair or bench in shower? Yes  Elevated toilet seat or a handicapped toilet? No   TIMED UP AND GO:  Was the test performed? No .   Cognitive Function: pt declined at this time       11/03/2020    2:00 PM  [facility] Cognitive Assessment   Visuospatial/ Executive (0/5) 4  Naming (0/3) 3  Attention: Read list of digits (0/2) 2  Attention: Read list of letters (0/1) 1  Attention: Serial 7 subtraction starting at 100 (0/3) 2  Language: Repeat phrase (0/2) 2  Language : Fluency (0/1) 0  Abstraction (0/2) 1  Delayed Recall (0/5) 2  Orientation (0/6) 6  Total 23  Adjusted Score (based on education) 24      Immunizations Immunization History  Administered Date(s) Administered   Hep A / Hep B 02/17/2018, 03/21/2018   Influenza,inj,Quad PF,6+ Mos 11/29/2014   Janssen (J&J) SARS-COV-2 Vaccination 08/08/2019   Pneumococcal Polysaccharide-23 05/01/2015   Tdap 03/12/2014   Zoster, Live 04/22/2014    TDAP status: Up to date  Flu Vaccine status: Declined, Education has been provided regarding the  importance of this vaccine but patient still declined. Advised may receive this vaccine at local pharmacy or Health Dept. Aware to provide a copy of the vaccination record if obtained from local pharmacy or Health Dept. Verbalized acceptance and understanding.  Pneumococcal vaccine status: Declined,  Education has been provided regarding the importance of this vaccine but patient still declined. Advised may receive this vaccine at local pharmacy or Health Dept. Aware to provide a copy of the vaccination record if obtained from local pharmacy or Health Dept. Verbalized acceptance and understanding.   Covid-19 vaccine status: Completed vaccines  Qualifies for Shingles Vaccine? Yes   Zostavax completed No   Shingrix Completed?: No.    Education has been provided regarding the importance of this vaccine. Patient has been advised to call insurance company to determine out of pocket expense if they have not yet received this vaccine.  Advised may also receive vaccine at local pharmacy or Health Dept. Verbalized acceptance and understanding.  Screening Tests Health Maintenance  Topic Date Due   COLONOSCOPY (Pts 45-90yr Insurance coverage will need to be confirmed)  03/15/2016   COVID-19 Vaccine (2 - Janssen risk series) 09/05/2019   HEMOGLOBIN A1C  04/03/2022   INFLUENZA VACCINE  02/23/2026 (Originally 10/13/2021)   OPHTHALMOLOGY EXAM  07/31/2022   FOOT EXAM  09/30/2022   Diabetic kidney evaluation - eGFR measurement  10/02/2022   Diabetic kidney evaluation - Urine ACR  10/02/2022   MAMMOGRAM  03/28/2023   Medicare Annual Wellness (AWV)  04/08/2023   DTaP/Tdap/Td (2 - Td or Tdap) 03/12/2024   DEXA SCAN  Completed   Hepatitis C Screening  Completed   HPV VACCINES  Aged Out   Pneumonia Vaccine 67 Years old  Discontinued   Zoster Vaccines- Shingrix  Discontinued    Health Maintenance  Health Maintenance Due  Topic Date Due   COLONOSCOPY (Pts 45-476yrInsurance coverage will need to be  confirmed)  03/15/2016   COVID-19 Vaccine (2 - Janssen risk series) 09/05/2019   HEMOGLOBIN A1C  04/03/2022    Colorectal cancer screening: No longer required.  Pt stated cologuard only   Mammogram status: Completed 10/01/21. Repeat every year  Bone Density status: Completed 01/10/18. Results reflect: Bone density results: OSTEOPENIA. Repeat every 2 years.   Additional Screening:  Hepatitis C Screening:  Completed 02/01/18  Vision Screening: Recommended annual ophthalmology exams for early detection of glaucoma and other disorders of the eye. Is the patient up to date with their annual eye exam?  Yes  Who is the provider or what is the name of the office in which the patient attends annual eye exams? Dr GrKaty FitchIf pt is not established with a provider, would they like to be referred to a provider to establish care? No .   Dental Screening: Recommended annual dental exams for proper oral hygiene  Community Resource Referral / Chronic Care Management: CRR required this visit?  No   CCM required this visit?  No      Plan:     I have personally reviewed and noted the following in the patient's chart:   Medical and social history Use of alcohol, tobacco or illicit drugs  Current medications and supplements including opioid prescriptions. Patient is not currently taking opioid prescriptions. Functional ability and status Nutritional status Physical activity Advanced directives List of other physicians Hospitalizations, surgeries, and ER visits in previous 12 months Vitals Screenings to include cognitive, depression, and falls Referrals and appointments  In addition, I have reviewed and discussed with patient certain preventive protocols, quality metrics, and best practice recommendations. A written personalized care plan for preventive services as well as general preventive health recommendations were provided to patient.     TiWillette BraceLPN   03/16/39/0301 Nurse  Notes: none

## 2022-04-09 ENCOUNTER — Other Ambulatory Visit (INDEPENDENT_AMBULATORY_CARE_PROVIDER_SITE_OTHER): Payer: Medicare Other

## 2022-04-09 DIAGNOSIS — Z79899 Other long term (current) drug therapy: Secondary | ICD-10-CM | POA: Diagnosis not present

## 2022-04-09 DIAGNOSIS — I10 Essential (primary) hypertension: Secondary | ICD-10-CM | POA: Diagnosis not present

## 2022-04-09 DIAGNOSIS — E1165 Type 2 diabetes mellitus with hyperglycemia: Secondary | ICD-10-CM

## 2022-04-09 DIAGNOSIS — E785 Hyperlipidemia, unspecified: Secondary | ICD-10-CM

## 2022-04-09 LAB — CBC WITH DIFFERENTIAL/PLATELET
Basophils Absolute: 0.1 10*3/uL (ref 0.0–0.1)
Basophils Relative: 1.3 % (ref 0.0–3.0)
Eosinophils Absolute: 0.4 10*3/uL (ref 0.0–0.7)
Eosinophils Relative: 4.7 % (ref 0.0–5.0)
HCT: 40.4 % (ref 36.0–46.0)
Hemoglobin: 13.4 g/dL (ref 12.0–15.0)
Lymphocytes Relative: 33.3 % (ref 12.0–46.0)
Lymphs Abs: 2.6 10*3/uL (ref 0.7–4.0)
MCHC: 33.2 g/dL (ref 30.0–36.0)
MCV: 85.1 fl (ref 78.0–100.0)
Monocytes Absolute: 0.5 10*3/uL (ref 0.1–1.0)
Monocytes Relative: 6.3 % (ref 3.0–12.0)
Neutro Abs: 4.2 10*3/uL (ref 1.4–7.7)
Neutrophils Relative %: 54.4 % (ref 43.0–77.0)
Platelets: 249 10*3/uL (ref 150.0–400.0)
RBC: 4.75 Mil/uL (ref 3.87–5.11)
RDW: 13.9 % (ref 11.5–15.5)
WBC: 7.8 10*3/uL (ref 4.0–10.5)

## 2022-04-09 LAB — COMPREHENSIVE METABOLIC PANEL
ALT: 28 U/L (ref 0–35)
AST: 28 U/L (ref 0–37)
Albumin: 4.2 g/dL (ref 3.5–5.2)
Alkaline Phosphatase: 197 U/L — ABNORMAL HIGH (ref 39–117)
BUN: 28 mg/dL — ABNORMAL HIGH (ref 6–23)
CO2: 23 mEq/L (ref 19–32)
Calcium: 10.2 mg/dL (ref 8.4–10.5)
Chloride: 105 mEq/L (ref 96–112)
Creatinine, Ser: 0.93 mg/dL (ref 0.40–1.20)
GFR: 63.08 mL/min (ref >60.00)
Glucose, Bld: 147 mg/dL — ABNORMAL HIGH (ref 70–99)
Potassium: 4.1 mEq/L (ref 3.5–5.1)
Sodium: 138 mEq/L (ref 135–145)
Total Bilirubin: 0.4 mg/dL (ref 0.2–1.2)
Total Protein: 6.9 g/dL (ref 6.0–8.3)

## 2022-04-09 LAB — LIPID PANEL
Cholesterol: 156 mg/dL (ref 0–200)
HDL: 46.4 mg/dL (ref >39.00)
NonHDL: 109.53
Total CHOL/HDL Ratio: 3
Triglycerides: 218 mg/dL — ABNORMAL HIGH (ref 0.0–149.0)
VLDL: 43.6 mg/dL — ABNORMAL HIGH (ref 0.0–40.0)

## 2022-04-09 LAB — LDL CHOLESTEROL, DIRECT: Direct LDL: 77 mg/dL

## 2022-04-12 ENCOUNTER — Ambulatory Visit (INDEPENDENT_AMBULATORY_CARE_PROVIDER_SITE_OTHER): Payer: Medicare Other | Admitting: Family Medicine

## 2022-04-12 ENCOUNTER — Encounter: Payer: Self-pay | Admitting: Family Medicine

## 2022-04-12 ENCOUNTER — Telehealth: Payer: Self-pay | Admitting: Family Medicine

## 2022-04-12 VITALS — BP 110/68 | HR 83 | Temp 98.1°F | Ht 65.5 in | Wt 199.1 lb

## 2022-04-12 DIAGNOSIS — E785 Hyperlipidemia, unspecified: Secondary | ICD-10-CM

## 2022-04-12 DIAGNOSIS — K754 Autoimmune hepatitis: Secondary | ICD-10-CM

## 2022-04-12 DIAGNOSIS — Z0000 Encounter for general adult medical examination without abnormal findings: Secondary | ICD-10-CM | POA: Diagnosis not present

## 2022-04-12 LAB — HEMOGLOBIN A1C: Hgb A1c MFr Bld: 7.4 % — ABNORMAL HIGH (ref 4.6–6.5)

## 2022-04-12 MED ORDER — ATORVASTATIN CALCIUM 20 MG PO TABS: 20.0000 mg | ORAL_TABLET | Freq: Every day | ORAL | 1 refills | Status: DC

## 2022-04-12 NOTE — Progress Notes (Signed)
Chief Complaint  Patient presents with   Annual Exam     Well Woman Karen Hall is here for a complete physical.   Her last physical was >1 year ago.  Current diet: in general, diet is fair, improving. Current exercise: active at home, walking. Weight is stable and she denies daytime fatigue. Seatbelt? Yes Advanced directive? Yes  Health Maintenance Colonoscopy- Yes Shingrix- Yes DEXA- Yes Mammogram- Yes Tetanus- Yes Pneumonia- Yes Hep C screen- Yes  Past Medical History:  Diagnosis Date   Allergy    ANXIETY 10/18/2007   Qualifier: Diagnosis of  By: Sarajane Jews MD, Ishmael Holter    Autoimmune hepatitis [facility])    Cataract    "beginnings of"   Chronic low back pain    GERD 10/18/2007   Qualifier: Diagnosis of  By: Sherlynn Stalls, CMA, [place]     Hyperlipemia 03/12/2014   HYPERTENSION 10/18/2007   Qualifier: Diagnosis of  By: Sherlynn Stalls, [place], [place]     Insomnia 03/12/2014   Irritable bowel syndrome 10/18/2007   Qualifier: Diagnosis of  By: Sarajane Jews MD, Ishmael Holter    Osteopenia    Recurrent knee pain    R knee hx meniscal tear 2002 with repair   Type 2 diabetes mellitus with hyperglycemia ([place])      Past Surgical History:  Procedure Laterality Date   BREAST EXCISIONAL BIOPSY Left    Fibroid removed, 24 years ago    BREAST SURGERY     fibroid   CATARACT EXTRACTION Bilateral 09/2018   CHOLECYSTECTOMY     COLONOSCOPY     Around 2010   ESOPHAGOGASTRODUODENOSCOPY (EGD) WITH PROPOFOL N/A 06/27/2019   Procedure: ESOPHAGOGASTRODUODENOSCOPY (EGD) WITH PROPOFOL;  Surgeon: Lavena Bullion, DO;  [facility];  Service: Gastroenterology;  Laterality: N/A;   FOOT SURGERY Right    LIVER BIOPSY  02/2018   MALONEY DILATION  06/27/2019   Procedure: MALONEY DILATION;  Surgeon: Lavena Bullion, DO;  [facility];  Service: Gastroenterology;;   MENISCUS REPAIR Right    TONSILLECTOMY     TRANSORAL INCISIONLESS FUNDOPLICATION N/A 2/87/8676   Procedure: TRANSORAL INCISIONLESS  FUNDOPLICATION;  Surgeon: Lavena Bullion, DO;  [facility];  Service: Gastroenterology;  Laterality: N/A;    Medications  Current Outpatient Medications on File Prior to Visit  Medication Sig Dispense Refill   atorvastatin (LIPITOR) 10 MG tablet Take 1 tablet (10 mg total) by mouth daily. 90 tablet 1   Cholecalciferol (VITAMIN D3) 125 MCG (5000 UT) CAPS Take 1 capsule by mouth daily.     citalopram (CELEXA) 20 MG tablet TAKE 1 TABLET BY MOUTH  DAILY 90 tablet 3   Homeopathic Products (ZICAM ALLERGY RELIEF NA) Place 1 application into the nose 2 (two) times daily as needed (congestion/allergies).      Melatonin 5 MG CAPS Take 20 mg by mouth.     Menthol, Topical Analgesic, (BIOFREEZE EX) Apply 1 application topically at bedtime as needed (pain).     olmesartan (BENICAR) 20 MG tablet TAKE 1 TABLET BY MOUTH EVERY DAY 90 tablet 1   Polyethyl Glycol-Propyl Glycol (SYSTANE) 0.4-0.3 % SOLN Place 1 drop into both eyes daily as needed (dry eyes).     simethicone (GAS-X) 80 MG chewable tablet Chew 1 tablet (80 mg total) by mouth 4 (four) times daily as needed (gas/bloating). 60 tablet 0   zolpidem (AMBIEN CR) 6.25 MG CR tablet Take 1 tablet (6.25 mg total) by mouth at bedtime as needed for sleep. 30 tablet 5  omeprazole (PRILOSEC) 20 MG capsule Take 1 capsule (20 mg total) by mouth daily. 90 capsule 5    Allergies Allergies  Allergen Reactions   Aspartame And Phenylalanine Other (See Comments)    Headache   Codeine Nausea Only    Review of Systems: Constitutional:  no fevers Eye:  no recent significant change in vision Ears:  No changes in hearing Nose/Mouth/Throat:  no complaints of nasal congestion, no sore throat Cardiovascular: no chest pain Respiratory:  No shortness of breath Gastrointestinal:  No change in bowel habits GU:  Female: negative for dysuria Integumentary:  no abnormal skin lesions reported Neurologic:  no headaches Endocrine:  denies unexplained weight  changes  Exam BP 110/68 (BP Location: Left Arm, Patient Position: Sitting, Cuff Size: Normal)   Pulse 83   Temp 98.1 F (36.7 C) (Oral)   Ht 5' 5.5" (1.664 m)   Wt 199 lb 2 oz (90.3 kg)   SpO2 94%   BMI 32.63 kg/m  General:  well developed, well nourished, in no apparent distress Skin:  no significant moles, warts, or growths Head:  no masses, lesions, or tenderness Eyes:  pupils equal and round, sclera anicteric without injection Ears:  canals without lesions, TMs shiny without retraction, no obvious effusion, no erythema Nose:  nares patent, mucosa normal, and no drainage Throat/Pharynx:  lips and gingiva without lesion; tongue and uvula midline; non-inflamed pharynx; no exudates or postnasal drainage Neck: neck supple without adenopathy, thyromegaly, or masses Lungs:  clear to auscultation, breath sounds equal bilaterally, no respiratory distress Cardio:  regular rate and rhythm, no bruits or LE edema Abdomen:  abdomen soft, nontender; bowel sounds normal; no masses or organomegaly Genital: Deferred Neuro:  gait normal; deep tendon reflexes normal and symmetric Psych: well oriented with normal range of affect and appropriate judgment/insight  Assessment and Plan  Well adult exam  Autoimmune hepatitis ([place]) - Plan: Amb Referral to Hepatology  Hyperlipidemia, unspecified hyperlipidemia type - Plan: Lipid panel, Hepatic function panel   Well 67 y.o. female. Counseled on diet and exercise. Advanced directive form requested today.  Other orders as above. Lipitor is being increased to 20 mg daily and we will recheck labs in 6 weeks. Follow up in 6 mo. The patient voiced understanding and agreement to the plan.  [place], DO 04/12/22 10:25 AM

## 2022-04-12 NOTE — Patient Instructions (Addendum)
If you do not hear anything about your referral in the next 1-2 weeks, call our office and ask for an update.  Keep the diet clean and stay active.  Please get me a copy of your advanced directive form at your convenience.   Let us know if you need anything.  Quadriceps Strain Rehab It is normal to feel mild stretching, pulling, tightness, or discomfort as you do these exercises, but you should stop right away if you feel sudden pain or your pain gets worse. Stretching and range of motion exercises These exercises warm up your muscles and joints and improve the movement and flexibility of your thigh. These exercises can also help to relieve stiffness or swelling. Exercise A: Heel slides    Lie on your back with both knees straight. If this causes back discomfort, bend the knee of your healthy leg, placing your foot flat on the floor. Slowly slide your left / right heel back toward your buttocks until you feel a gentle stretch in the front of your knee or thigh. Hold for 30 seconds. Then slowly slide your heel back to the starting position. Repeat 2 times. Complete this exercise 3 times a week. Exercise B: Quadriceps stretch, prone    Lie on your abdomen on a firm surface, such as a bed or padded floor. Bend your left / right knee and hold your ankle. If you cannot reach your ankle or pant leg, loop a belt around your foot and grab the belt instead. Gently pull your heel toward your buttocks. Your knee should not slide out to the side. You should feel a stretch in the front of your thigh and knee. Hold this position for 30 seconds. Repeat 2 times. Complete this exercise 3 times a week. Strengthening exercises These exercises build strength and endurance in your thigh. Endurance is the ability to use your muscles for a long time, even after your muscles get tired. Exercise C: Straight leg raises (quadriceps and hip flexors) Quality counts! Watch for signs that the quadriceps muscle is  working to ensure that you are strengthening the correct muscles and not cheating by using healthier muscles. Lie on your back with your left / right leg extended and your other knee bent. Tense the muscles in the front of your left / right thigh. You should see your kneecap slide up or see increased dimpling just above the knee. Tighten these muscles even more and raise your leg 4-6 inches (10-15 cm) off the floor. Hold for 3 seconds. Keep the thigh muscles tense as you lower your leg. Relax the muscles slowly and completely after each repetition. Repeat 2 times. Complete this exercise 3 times a week. Exercise D: Straight leg raises (hip extensors) Lie on your belly on a bed or a firm surface with a pillow under your hips. Bend your left / right knee so your foot is straight up in the air. Tense your buttock muscles and lift your left / right thigh off the bed. Do not let your back arch. Hold this position for 3 seconds. Slowly return to the starting position. Let your muscles relax completely before doing another repetition. Repeat 2 times. Complete this exercise 3 times a week. Exercise E: Wall sits    Follow the directions for form closely. If you do not place your feet and knees properly, this can lead to knee pain. Lean back against a smooth wall or door and walk your feet out 18-24 inches (46-61 cm) from it. Place  your feet hip-width apart. Slowly slide down the wall or door until your knees bend  60-90 degrees. Keep your weight back and over your heels, not over your toes. Keep your thighs straight or pointing slightly outward. Hold for 1 second. Use your thigh and buttock muscles to push you back up to a standing position. Keep your weight through your heels while you do this. Rest for 5 seconds in between repetitions. Repeat 2 times. Complete this exercise 3 times a week. Make sure you discuss any questions you have with your health care provider. Document Released: 03/01/2005  Document Revised: 11/06/2015 Document Reviewed: 12/03/2014 Elsevier Interactive Patient Education  [employer].

## 2022-04-12 NOTE — Telephone Encounter (Signed)
Vicky with [facility] calling to speak with referral coordinator regarding this patient's referral. Her direct callback [phone]

## 2022-04-13 LAB — DRUG MONITORING PANEL 376104, URINE
Amphetamines: NEGATIVE ng/mL (ref <500)
Barbiturates: NEGATIVE ng/mL (ref <300)
Benzodiazepines: NEGATIVE ng/mL (ref <100)
Cocaine Metabolite: NEGATIVE ng/mL (ref <150)
Desmethyltramadol: NEGATIVE ng/mL (ref <100)
Opiates: NEGATIVE ng/mL (ref <100)
Oxycodone: NEGATIVE ng/mL (ref <100)
Tramadol: NEGATIVE ng/mL (ref <100)

## 2022-04-13 LAB — DM TEMPLATE

## 2022-04-26 DIAGNOSIS — C50211 Malignant neoplasm of upper-inner quadrant of right female breast: Secondary | ICD-10-CM | POA: Diagnosis not present

## 2022-04-26 DIAGNOSIS — Z170 Estrogen receptor positive status [ER+]: Secondary | ICD-10-CM | POA: Diagnosis not present

## 2022-05-22 ENCOUNTER — Other Ambulatory Visit: Payer: Self-pay | Admitting: Family Medicine

## 2022-05-22 DIAGNOSIS — I10 Essential (primary) hypertension: Secondary | ICD-10-CM

## 2022-05-26 DIAGNOSIS — Z170 Estrogen receptor positive status [ER+]: Secondary | ICD-10-CM | POA: Diagnosis not present

## 2022-05-26 DIAGNOSIS — C50211 Malignant neoplasm of upper-inner quadrant of right female breast: Secondary | ICD-10-CM | POA: Diagnosis not present

## 2022-06-03 ENCOUNTER — Other Ambulatory Visit: Payer: Self-pay | Admitting: Family Medicine

## 2022-06-03 MED ORDER — ZOLPIDEM TARTRATE ER 6.25 MG PO TBCR: 6.2500 mg | EXTENDED_RELEASE_TABLET | Freq: Every evening | ORAL | 5 refills | Status: DC | PRN

## 2022-06-03 NOTE — Addendum Note (Signed)
Addended by: Jeronimo Greaves on: 06/03/2022 11:11 AM   Modules accepted: Orders

## 2022-06-03 NOTE — Telephone Encounter (Signed)
Prescription Request  06/03/2022  Is this a "Controlled Substance" medicine? Yes  LOV: 04/12/2022  What is the name of the medication or equipment?  zolpidem (AMBIEN CR) 6.25 MG CR tablet   Have you contacted your pharmacy to request a refill? No   Which pharmacy would you like this sent to?  [facility] [address] Phone: [phone] Fax: [phone]    Patient notified that their request is being sent to the clinical staff for review and that they should receive a response within 2 business days.   Please advise at [place]

## 2022-06-04 ENCOUNTER — Other Ambulatory Visit: Payer: Self-pay | Admitting: Gastroenterology

## 2022-06-07 DIAGNOSIS — H26491 Other secondary cataract, right eye: Secondary | ICD-10-CM | POA: Diagnosis not present

## 2022-06-07 DIAGNOSIS — H43813 Vitreous degeneration, bilateral: Secondary | ICD-10-CM | POA: Diagnosis not present

## 2022-06-07 DIAGNOSIS — Z961 Presence of intraocular lens: Secondary | ICD-10-CM | POA: Diagnosis not present

## 2022-06-07 LAB — HM DIABETES EYE EXAM

## 2022-06-20 ENCOUNTER — Encounter: Payer: Self-pay | Admitting: Family Medicine

## 2022-06-21 ENCOUNTER — Ambulatory Visit (INDEPENDENT_AMBULATORY_CARE_PROVIDER_SITE_OTHER): Payer: Medicare Other | Admitting: Family Medicine

## 2022-06-21 ENCOUNTER — Encounter: Payer: Self-pay | Admitting: Family Medicine

## 2022-06-21 VITALS — BP 142/96 | HR 96 | Temp 97.5°F | Resp 16 | Ht 66.0 in | Wt 198.6 lb

## 2022-06-21 DIAGNOSIS — I10 Essential (primary) hypertension: Secondary | ICD-10-CM

## 2022-06-21 MED ORDER — OLMESARTAN MEDOXOMIL 40 MG PO TABS: 40.0000 mg | ORAL_TABLET | Freq: Every day | ORAL | 2 refills | Status: DC

## 2022-06-21 NOTE — Progress Notes (Signed)
Chief Complaint  Patient presents with   Hypertension    Here Elevated Bp    Subjective [name] is a 67 y.o. female who presents for hypertension follow up. She does monitor home blood pressures. Blood pressures ranging from 140-150's/90-100's on average. She is compliant with medication- olmesartan 20 mg/d. Patient has these side effects of medication: none She is sometimes adhering to a healthy diet overall. Current exercise: walking No CP or SOB.    Past Medical History:  Diagnosis Date   Allergy    ANXIETY 10/18/2007   Qualifier: Diagnosis of  By: Clent Ridges MD, Tera Mater    Autoimmune hepatitis    Cataract    "beginnings of"   Chronic low back pain    GERD 10/18/2007   Qualifier: Diagnosis of  By: Linna Darner, CMA, Cindy     Hyperlipemia 03/12/2014   HYPERTENSION 10/18/2007   Qualifier: Diagnosis of  By: Linna Darner, CMA, Cindy     Insomnia 03/12/2014   Irritable bowel syndrome 10/18/2007   Qualifier: Diagnosis of  By: Clent Ridges MD, Tera Mater    Osteopenia    Recurrent knee pain    R knee hx meniscal tear 2002 with repair   Type 2 diabetes mellitus with hyperglycemia     Exam BP (!) 142/96 (BP Location: Left Arm, Cuff Size: Large)   Pulse 96   Temp (!) 97.5 F (36.4 C) (Oral)   Resp 16   Ht 5\' 6"  (1.676 m)   Wt 198 lb 9.6 oz (90.1 kg)   SpO2 96%   BMI 32.05 kg/m  General:  well developed, well nourished, in no apparent distress Heart: RRR, no bruits, no LE edema Lungs: clear to auscultation, no accessory muscle use Psych: well oriented with normal range of affect and appropriate judgment/insight  Essential hypertension - Plan: olmesartan (BENICAR) 40 MG tablet  Chronic, uncontrolled. Increase Benicar from 20 mg/d to 40 mg/d. Counseled on diet/exercise as above. Will add Norvasc back if no improvement. Monitor BP at home.  F/u in 2 weeks. The patient voiced understanding and agreement to the plan.  [name] [employer], DO 06/21/22  3:33 PM

## 2022-06-21 NOTE — Patient Instructions (Addendum)
Keep the diet clean and stay active.  Check your blood pressures 2-3 times per week, alternating the time of day you check it. If it is high, considering waiting 1-2 minutes and rechecking. If it gets higher, your anxiety is likely creeping up and we should avoid rechecking.   Let us know if you need anything.  

## 2022-06-28 ENCOUNTER — Other Ambulatory Visit: Payer: Self-pay | Admitting: Nurse Practitioner

## 2022-06-28 ENCOUNTER — Encounter: Payer: Self-pay | Admitting: *Deleted

## 2022-06-28 DIAGNOSIS — R748 Abnormal levels of other serum enzymes: Secondary | ICD-10-CM

## 2022-06-28 DIAGNOSIS — K754 Autoimmune hepatitis: Secondary | ICD-10-CM | POA: Diagnosis not present

## 2022-07-05 ENCOUNTER — Ambulatory Visit (INDEPENDENT_AMBULATORY_CARE_PROVIDER_SITE_OTHER): Payer: Medicare Other | Admitting: Family Medicine

## 2022-07-05 ENCOUNTER — Encounter: Payer: Self-pay | Admitting: Family Medicine

## 2022-07-05 VITALS — BP 121/98 | HR 72 | Temp 98.0°F | Ht 66.0 in | Wt 199.1 lb

## 2022-07-05 DIAGNOSIS — I10 Essential (primary) hypertension: Secondary | ICD-10-CM

## 2022-07-05 MED ORDER — AMLODIPINE BESYLATE 5 MG PO TABS
5.0000 mg | ORAL_TABLET | Freq: Every day | ORAL | 3 refills | Status: DC
Start: 2022-07-05 — End: 2022-10-05

## 2022-07-05 NOTE — Progress Notes (Signed)
Chief Complaint  Patient presents with   Follow-up    Blood pressure     Subjective Karen Hall is a 67 y.o. female who presents for hypertension follow up. She does monitor home blood pressures. Blood pressures ranging from 130's/80's on average. She is compliant with medications- olmesartan 40 mg/d. Patient has these side effects of medication: none She is sometimes adhering to a healthy diet overall. Diet is improving.  Current exercise: walking No CP or SOB.    Past Medical History:  Diagnosis Date   Allergy    ANXIETY 10/18/2007   Qualifier: Diagnosis of  By: Clent Ridges MD, Tera Mater    Autoimmune hepatitis    Cataract    "beginnings of"   Chronic low back pain    GERD 10/18/2007   Qualifier: Diagnosis of  By: Linna Darner, CMA, Cindy     Hyperlipemia 03/12/2014   HYPERTENSION 10/18/2007   Qualifier: Diagnosis of  By: Linna Darner, CMA, Cindy     Insomnia 03/12/2014   Irritable bowel syndrome 10/18/2007   Qualifier: Diagnosis of  By: Clent Ridges MD, Tera Mater    Osteopenia    Recurrent knee pain    R knee hx meniscal tear 2002 with repair   Type 2 diabetes mellitus with hyperglycemia     Exam BP (!) 121/98 (BP Location: Left Arm, Patient Position: Sitting, Cuff Size: Normal)   Pulse 72   Temp 98 F (36.7 C) (Oral)   Ht  (1.676 m)   Wt 199 lb 2 oz (90.3 kg)   SpO2 93%   BMI 32.14 kg/m  General:  well developed, well nourished, in no apparent distress Heart: RRR, no bruits, no LE edema Lungs: clear to auscultation, no accessory muscle use Psych: well oriented with normal range of affect and appropriate judgment/insight  Essential hypertension - Plan: amLODipine (NORVASC) 5 MG tablet  Chronic, stable. Cont olmesartan 40 mg/d, start Norvasc 5 mg/d. Monitor BP at home. Counseled on diet and exercise. F/u in 1 mo. The patient voiced understanding and agreement to the plan.  Karen Hall [place], DO 07/05/22  3:08 PM

## 2022-07-05 NOTE — Patient Instructions (Signed)
Keep the diet clean and stay active. ? ?Aim to do some physical exertion for 150 minutes per week. This is typically divided into 5 days per week, 30 minutes per day. The activity should be enough to get your heart rate up. Anything is better than nothing if you have time constraints. ? ?Check your blood pressures 2-3 times per week, alternating the time of day you check it. If it is high, considering waiting 1-2 minutes and rechecking. If it gets higher, your anxiety is likely creeping up and we should avoid rechecking.  ? ?Let us know if you need anything. ?

## 2022-07-07 IMAGING — MG MM DIGITAL SCREENING BILAT W/ TOMO AND CAD
6 of 10 series · 6 of 30 positions shown · non-contrast
Comparison: Previous exam(s).

CLINICAL DATA: Screening.

EXAM:
DIGITAL SCREENING BILATERAL MAMMOGRAM WITH TOMOSYNTHESIS AND CAD
TECHNIQUE: Bilateral screening digital craniocaudal and mediolateral oblique
mammograms were obtained. Bilateral screening digital breast
tomosynthesis was performed. The images were evaluated with
computer-aided detection.

[L CC synth-2D]
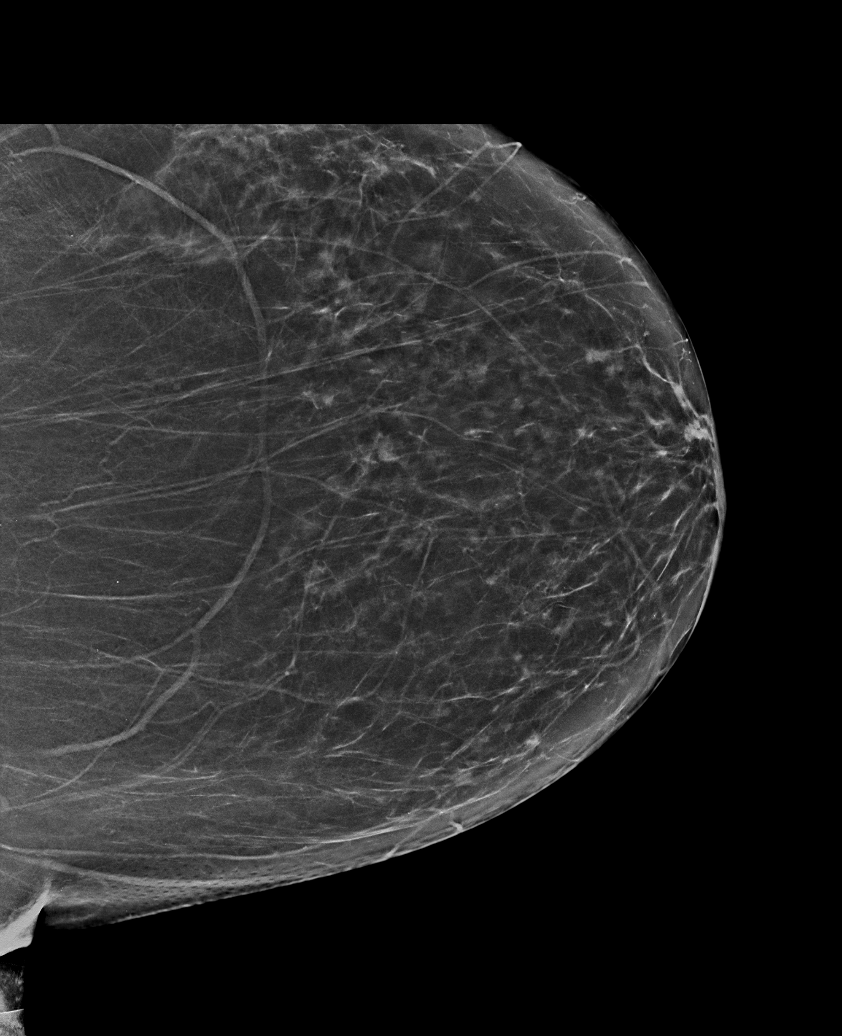

[R XCCL synth-2D]
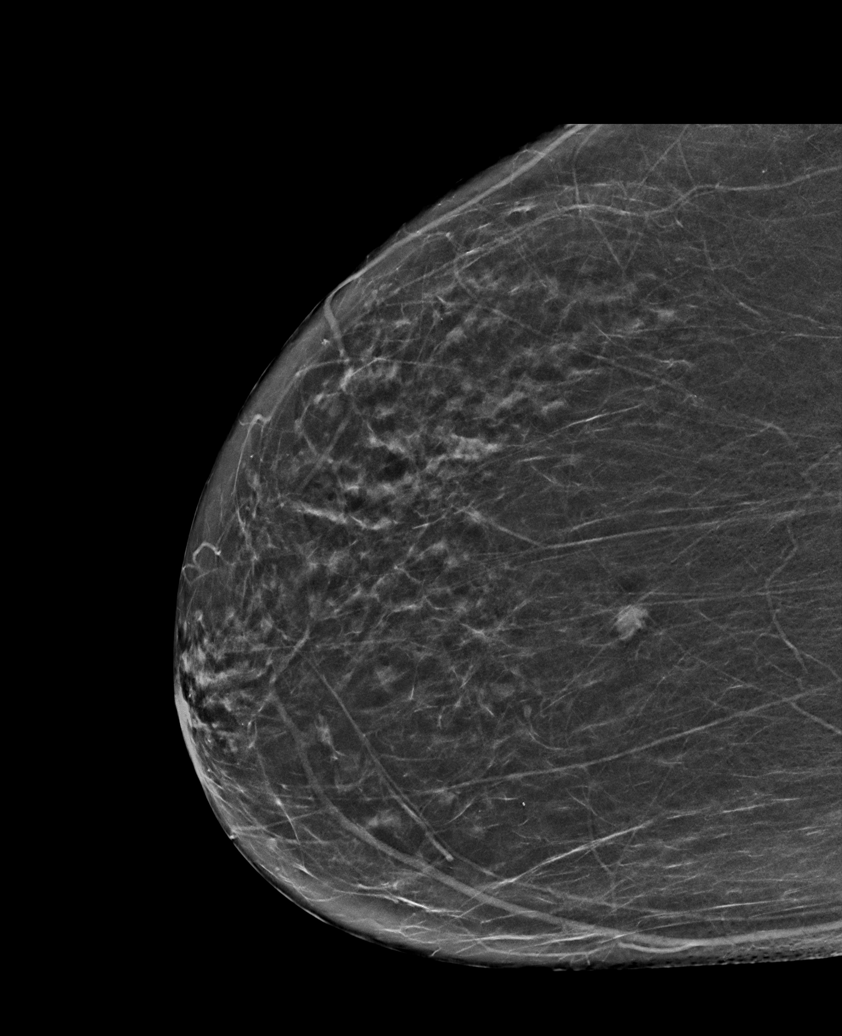

[L MLO synth-2D]
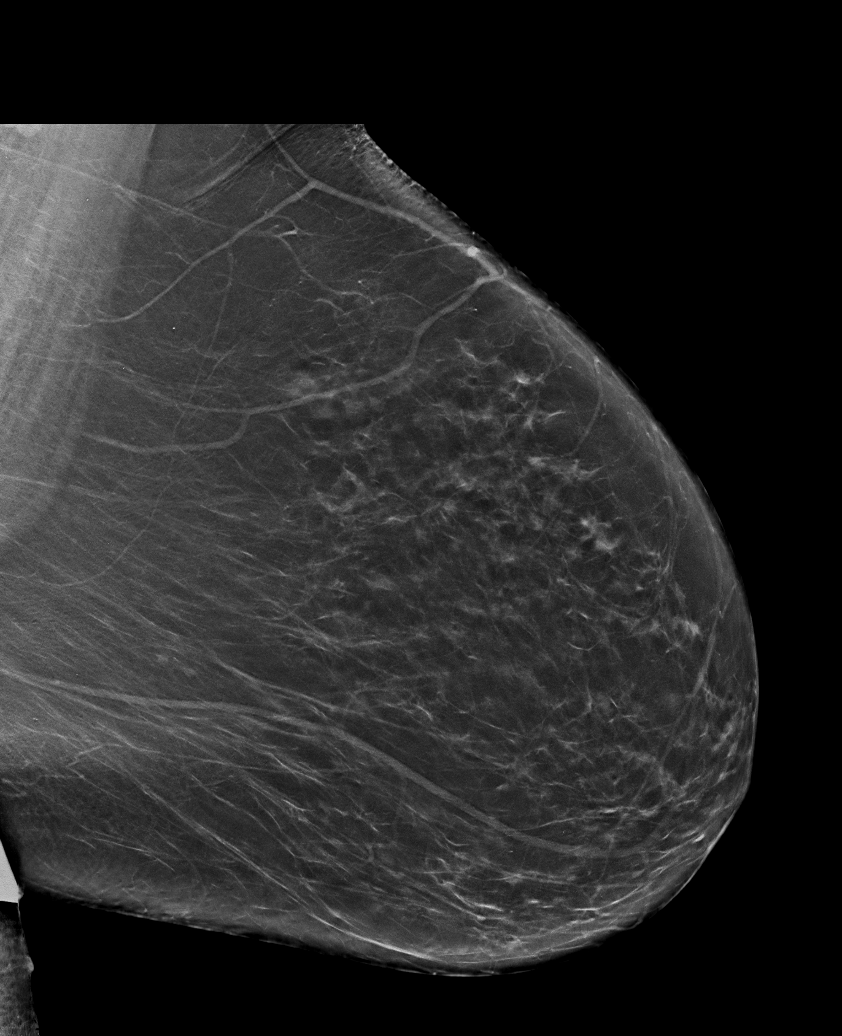

[R CC synth-2D]
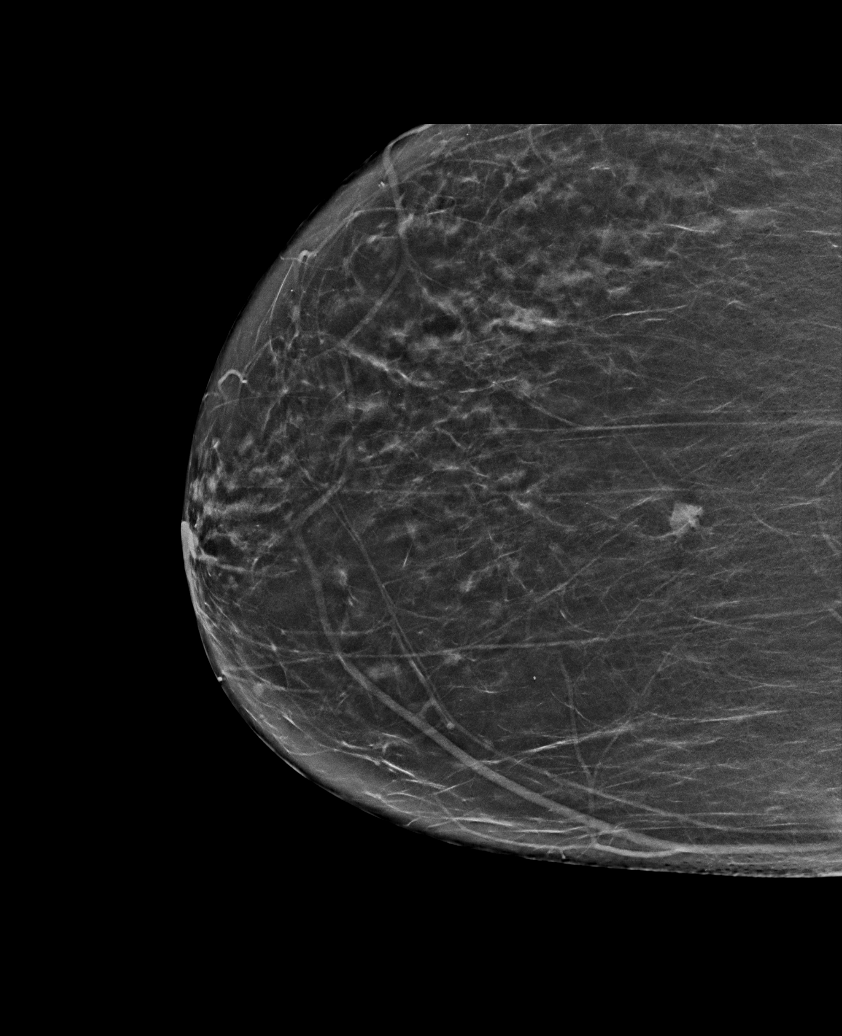

[R MLO synth-2D]
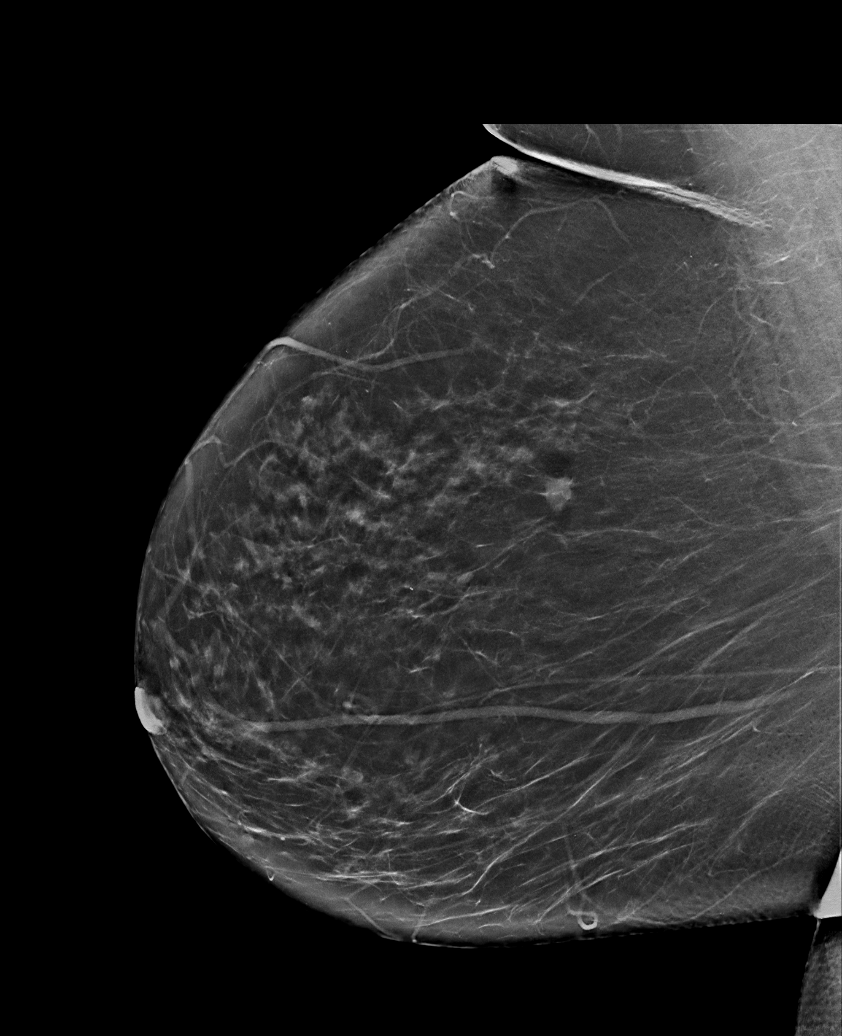

[L MLO tomo · tomo slice 37/74.0]
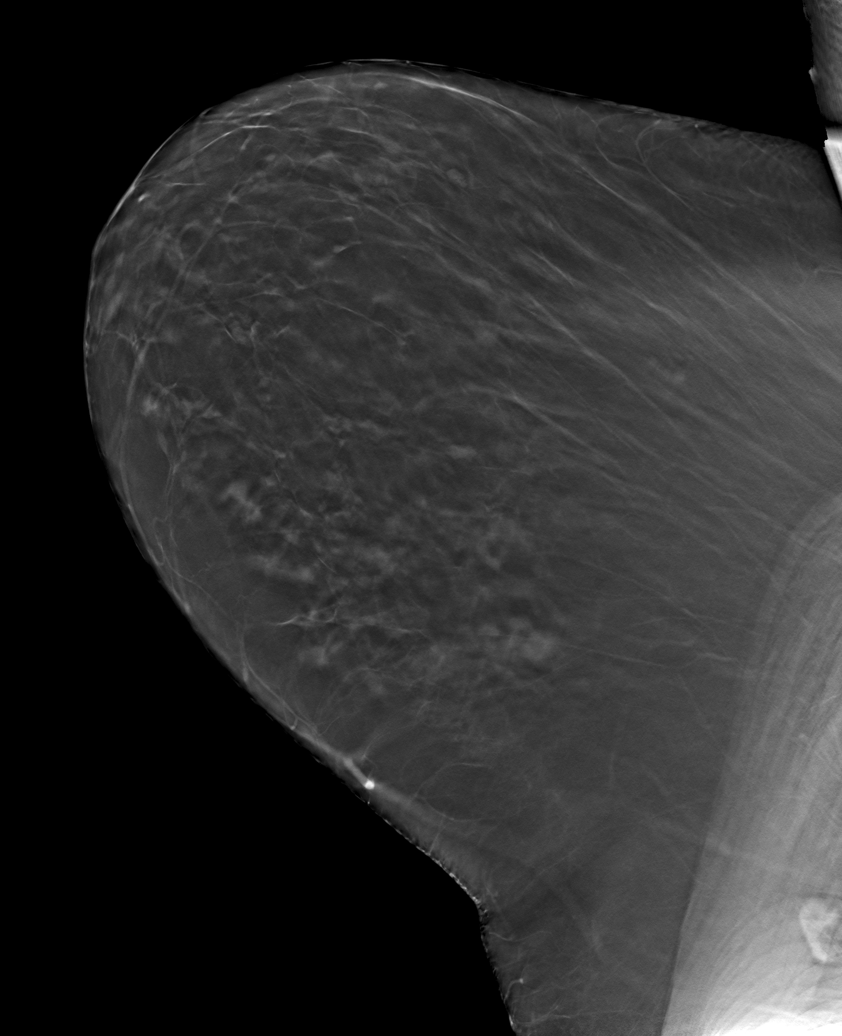

[6 of 30 positions shown; findings below may reference images not displayed]

ACR Breast Density Category b: There are scattered areas of
fibroglandular density.
FINDINGS: In the right breast, a possible mass warrants further evaluation. In
the left breast, no findings suspicious for malignancy.
IMPRESSION: Further evaluation is suggested for a possible mass in the right
breast.

RECOMMENDATION:
Diagnostic mammogram and possibly ultrasound of the right breast.
(Code:[id number])

The patient will be contacted regarding the findings, and additional
imaging will be scheduled.

BI-RADS CATEGORY  0: Incomplete. Need additional imaging evaluation
and/or prior mammograms for comparison.

## 2022-07-26 ENCOUNTER — Ambulatory Visit
Admission: RE | Admit: 2022-07-26 | Discharge: 2022-07-26 | Disposition: A | Discharge status: Home / Self Care | Payer: Medicare Other | Source: Ambulatory Visit | Attending: Nurse Practitioner | Admitting: Nurse Practitioner

## 2022-07-26 DIAGNOSIS — R748 Abnormal levels of other serum enzymes: Secondary | ICD-10-CM | POA: Diagnosis not present

## 2022-07-26 DIAGNOSIS — K754 Autoimmune hepatitis: Secondary | ICD-10-CM

## 2022-08-25 ENCOUNTER — Telehealth: Payer: Self-pay

## 2022-08-25 NOTE — Patient Outreach (Signed)
  Care Coordination   08/25/2022 Name: Karen Hall MRN: [id number] DOB: 1955/05/05   Care Coordination Outreach Attempts:  An unsuccessful telephone outreach was attempted today to offer the patient information about available care coordination services.  Follow Up Plan:  Additional outreach attempts will be made to offer the patient care coordination information and services.   Encounter Outcome:  No Answer   Care Coordination Interventions:  No, not indicated    [name], RN, MSN, BSN, CCM [facility] Care Coordinator [phone]

## 2022-08-30 ENCOUNTER — Other Ambulatory Visit: Payer: Self-pay | Admitting: Gastroenterology

## 2022-08-31 ENCOUNTER — Telehealth: Payer: Self-pay

## 2022-08-31 NOTE — Patient Outreach (Signed)
  Care Coordination   Initial Visit Note   08/31/2022 Name: Karen Hall MRN: [id number] DOB: 1955-05-08  Karen Hall is a 67 y.o. year old female who sees Carmelia Roller, [name], DO for primary care. I spoke with  Karen Hall by phone today.  What matters to the patients health and wellness today?  Karen Hall denies any care coordination, education or resource needs. She reports, "I'm fine". She declines to complete assessment further. Patient to contact primary care provider if care coordination needs in the future.  Goals Addressed             This Visit's Progress    COMPLETED: Care Coordination activities       Interventions Today    Flowsheet Row Most Recent Value  General Interventions   General Interventions Discussed/Reviewed General Interventions Discussed  [discussed care coordination program. RNCM offered to provide RNCM contact number. encouraged patient to contact PCP in the future if any care coordination needs]            SDOH assessments and interventions completed:  No  Care Coordination Interventions:  Yes, provided   Follow up plan: No further intervention required.   Encounter Outcome:  Pt. Visit Completed   [name], RN, MSN, BSN, CCM [facility] Care Coordinator [phone]

## 2022-09-16 ENCOUNTER — Other Ambulatory Visit: Payer: Self-pay | Admitting: Family Medicine

## 2022-09-16 DIAGNOSIS — I10 Essential (primary) hypertension: Secondary | ICD-10-CM

## 2022-09-17 ENCOUNTER — Encounter: Payer: Self-pay | Admitting: Family Medicine

## 2022-09-17 ENCOUNTER — Other Ambulatory Visit: Payer: Self-pay | Admitting: Family Medicine

## 2022-09-17 DIAGNOSIS — I10 Essential (primary) hypertension: Secondary | ICD-10-CM

## 2022-09-17 MED ORDER — OLMESARTAN MEDOXOMIL 20 MG PO TABS: 20.0000 mg | ORAL_TABLET | Freq: Every day | ORAL | 1 refills | Status: DC

## 2022-09-28 DIAGNOSIS — M722 Plantar fascial fibromatosis: Secondary | ICD-10-CM | POA: Diagnosis not present

## 2022-09-28 DIAGNOSIS — M79672 Pain in left foot: Secondary | ICD-10-CM | POA: Diagnosis not present

## 2022-10-05 ENCOUNTER — Other Ambulatory Visit: Payer: Self-pay | Admitting: Family Medicine

## 2022-10-05 ENCOUNTER — Telehealth: Payer: Self-pay | Admitting: Family Medicine

## 2022-10-05 DIAGNOSIS — I10 Essential (primary) hypertension: Secondary | ICD-10-CM

## 2022-10-05 NOTE — Telephone Encounter (Signed)
Patient called and wanting a med refill on amLODipine (NORVASC) 5 MG tablet  I advised patient there was 1 refill left but she insisted that pharmacy should be called.

## 2022-10-05 NOTE — Telephone Encounter (Signed)
Refill done already #90 with 1 refill done today 10/05/22 Called the patient and informed refill done.

## 2022-10-13 ENCOUNTER — Other Ambulatory Visit: Payer: Self-pay | Admitting: Family Medicine

## 2022-10-15 ENCOUNTER — Other Ambulatory Visit: Payer: Self-pay | Admitting: Family Medicine

## 2022-10-18 NOTE — Telephone Encounter (Signed)
Last OV--07/05/22 Last RF--06/03/2022--#30 with 5 Refills

## 2022-10-19 ENCOUNTER — Other Ambulatory Visit: Payer: Self-pay | Admitting: Family Medicine

## 2022-10-19 ENCOUNTER — Telehealth: Payer: Self-pay | Admitting: Family Medicine

## 2022-10-19 NOTE — Telephone Encounter (Signed)
Pt called and stated that she was advised per [facility] that they faxed over the documents to approve a refill for the medication zolpidem (AMBIEN CR) 6.25 MG CR tablet & they're waiting to receive back the approval documents on the prescription. Please fax documents and advise pt.    Pt is using [facility]  Address: [address] [place], [place] [address] Phone: [phone]

## 2022-10-19 NOTE — Telephone Encounter (Signed)
Patient informed. 

## 2022-10-19 NOTE — Telephone Encounter (Signed)
Sent request already today

## 2022-10-19 NOTE — Telephone Encounter (Signed)
Spoke to the patient She last refilled this on 08/12/22 and has 1/2 pill left. Her bottles says no more refills. She does want this to go to [facility]---[facility] is too expensive

## 2022-10-26 ENCOUNTER — Telehealth: Payer: Self-pay

## 2022-10-26 NOTE — Telephone Encounter (Signed)
*  Primary  Pharmacy Patient Advocate Encounter   Received notification from CoverMyMeds that prior authorization for Zolpidem Tartrate ER 6.25MG  er tablets  is required/requested.   Insurance verification completed.   The patient is insured through [facility] .   Per test claim: PA required; PA submitted to [facility] via CoverMyMeds Key/confirmation #/EOC [id number] Status is pending

## 2022-10-29 NOTE — Telephone Encounter (Signed)
Pharmacy Patient Advocate Encounter  Received notification from [facility] that Prior Authorization for Zolpidem ER has been DENIED. Please advise how you'd like to proceed. Full denial letter will be uploaded to the media tab. See denial reason below.

## 2022-11-03 ENCOUNTER — Encounter: Payer: Self-pay | Admitting: Family Medicine

## 2022-11-03 MED ORDER — ZOLPIDEM TARTRATE ER 6.25 MG PO TBCR: 6.2500 mg | EXTENDED_RELEASE_TABLET | Freq: Every evening | ORAL | 5 refills | Status: DC | PRN

## 2022-11-03 MED ORDER — BELSOMRA 20 MG PO TABS: 20.0000 mg | ORAL_TABLET | Freq: Every evening | ORAL | 0 refills | Status: DC | PRN

## 2022-11-03 NOTE — Addendum Note (Signed)
Addended by: Radene Gunning on: 11/03/2022 03:31 PM   Modules accepted: Orders

## 2022-11-03 NOTE — Telephone Encounter (Signed)
Plz inform pt and let her know we will be sending a new one that is covered and needing to fail (total of 3 looks like) before we can try this again. Ty.

## 2022-11-03 NOTE — Addendum Note (Signed)
Addended by: Radene Gunning on: 11/03/2022 07:04 AM   Modules accepted: Orders

## 2022-11-03 NOTE — Telephone Encounter (Signed)
Original sent.

## 2022-11-03 NOTE — Telephone Encounter (Signed)
Called left message to call back 

## 2022-11-03 NOTE — Telephone Encounter (Signed)
She knows it is not covered she pays cash at [employer]. Please remove one sent in and add this back on her list.

## 2022-12-16 ENCOUNTER — Telehealth: Payer: Self-pay

## 2022-12-16 NOTE — Telephone Encounter (Signed)
Have her hold her BP meds for the next couple days and resume when she starts resuming normal oral intake with just the olmesartan. After a few days of feeling better and BP coming up, add back Norvasc. Ty.

## 2022-12-16 NOTE — Telephone Encounter (Signed)
Initial Comment Caller states she just got over the flu and now her bp is low and she keeps blacking. The last reading was 92/60. GOTO Facility Not Listed UCC Translation No Nurse Assessment Nurse: Jayme Cloud, RN, Luis Date/Time (Eastern Time): 12/16/2022 3:26:13 PM Confirm and document reason for call. If symptomatic, describe symptoms. ---Caller states low BP 92/60, reports feeling light headed, even while seated. On 2 BP medications. Does the patient have any new or worsening symptoms? ---Yes Will a triage be completed? ---Yes Related visit to physician within the last 2 weeks? ---No Does the PT have any chronic conditions? (i.e. diabetes, asthma, this includes High risk factors for pregnancy, etc.) ---Yes List chronic conditions. ---HTN Arthritis Is this a behavioral health or substance abuse call? ---No Guidelines Guideline Title Affirmed Question Affirmed Notes Nurse Date/Time (Eastern Time) Blood Pressure - Low Patient sounds very sick or weak to the triager Jayme Cloud, RN, Luis 12/16/2022 3:29:11 PM Disp. Time Lamount Cohen Time) Disposition Final User 12/16/2022 3:25:09 PM Send to Urgent Queue Lonia Skinner 12/16/2022 3:32:32 PM Go to ED Now (or PCP triage) Yes Jayme Cloud, RN, Luis PLEASE NOTE: All timestamps contained within this report are represented as [place] Standard Time. CONFIDENTIALTY NOTICE: This fax transmission is intended only for the addressee. It contains information that is legally privileged, confidential or otherwise protected from use or disclosure. If you are not the intended recipient, you are strictly prohibited from reviewing, disclosing, copying using or disseminating any of this information or taking any action in reliance on or regarding this information. If you have received this fax in error, please notify us immediately by telephone so that we can arrange for its return to [place]. Phone: [phone], Toll-Free: [phone], Fax: [phone] Page: 2 of  2 Call Id: [id number] Final Disposition 12/16/2022 3:32:32 PM Go to ED Now (or PCP triage) Yes Jayme Cloud, RN, Trinna Balloon Disagree/Comply Comply Caller Understands Yes PreDisposition Did not know what to do Care Advice Given Per Guideline GO TO ED/UCC NOW (OR PCP TRIAGE): * IF NO PCP (PRIMARY CARE PROVIDER) SECOND-LEVEL TRIAGE: You need to be seen within the next hour. Go to the ED/UCC at _____________ Hospital. Leave as soon as you can. ANOTHER ADULT SHOULD DRIVE: * It is better and safer if another adult drives instead of you. CARE ADVICE given per Blood Pressure - Low (Adult) guideline. Referrals GO TO FACILITY OTHER - SPECIFY

## 2022-12-16 NOTE — Telephone Encounter (Signed)
Pt triaged to UC/ED.

## 2022-12-17 NOTE — Telephone Encounter (Signed)
Called informed the patient of PCP response/instructions. She verbalized instructions. Stated she will see PCP on Monday 12/20/22.

## 2022-12-17 NOTE — Telephone Encounter (Signed)
Called left message to call back 

## 2022-12-20 ENCOUNTER — Ambulatory Visit: Payer: Medicare Other | Admitting: Family Medicine

## 2022-12-20 ENCOUNTER — Encounter: Payer: Self-pay | Admitting: Family Medicine

## 2022-12-20 ENCOUNTER — Ambulatory Visit (INDEPENDENT_AMBULATORY_CARE_PROVIDER_SITE_OTHER): Payer: Medicare Other | Admitting: Family Medicine

## 2022-12-20 VITALS — BP 108/70 | HR 84 | Temp 98.6°F | Ht 66.0 in | Wt 198.5 lb

## 2022-12-20 DIAGNOSIS — E785 Hyperlipidemia, unspecified: Secondary | ICD-10-CM | POA: Diagnosis not present

## 2022-12-20 DIAGNOSIS — I10 Essential (primary) hypertension: Secondary | ICD-10-CM

## 2022-12-20 DIAGNOSIS — E1165 Type 2 diabetes mellitus with hyperglycemia: Secondary | ICD-10-CM

## 2022-12-20 DIAGNOSIS — F411 Generalized anxiety disorder: Secondary | ICD-10-CM

## 2022-12-20 NOTE — Patient Instructions (Addendum)
Foods that may reduce pain: 1) Ginger 2) Blueberries 3) Salmon 4) Pumpkin seeds 5) dark chocolate 6) turmeric 7) tart cherries 8) virgin olive oil 9) chilli peppers 10) mint 11) krill oil  Check your blood pressures 2-3 times per week, alternating the time of day you check it. If it is high, considering waiting 1-2 minutes and rechecking. If it gets higher, your anxiety is likely creeping up and we should avoid rechecking.   I want your blood pressure less than 140 on the top and less than 90 on the bottom consistently. Both goals must be met (ie, 150/70 is too high even though the 70 on the bottom is desirable).   Let us know if you need anything.

## 2022-12-20 NOTE — Progress Notes (Signed)
Chief Complaint  Patient presents with   Follow-up    Low BP    Subjective: Patient is a 67 y.o. female here for f/u BP.  Had Covid last week and was lightheaded while walking and coughing.  She has stopped taking her blood pressure medicine and her symptoms have resolved.  DM-diet controlled.  Does not monitor sugars at home.  No chest pain or shortness of breath.  Hyperlipidemia Patient presents for hyperlipidemia follow up. Currently being treated with Lipitor 20 mg daily and compliance with treatment thus far has been good. She denies myalgias. Diet/exercise as above. The patient is not known to have coexisting coronary artery disease.  Anxiety-history of anxiety relatively controlled on Celexa 20 mg daily and Ambien 3.25 mg nightly as needed.  She reports compliance and no adverse effects.  Not following with a therapist or counselor.  No homicidal/suicidal ideation.  No self-medication.  Past Medical History:  Diagnosis Date   Allergy    ANXIETY 10/18/2007   Qualifier: Diagnosis of  By: Clent Ridges MD, Tera Mater    Autoimmune hepatitis [facility])    Cataract    "beginnings of"   Chronic low back pain    GERD 10/18/2007   Qualifier: Diagnosis of  By: Linna Darner, CMA, Cindy     Hyperlipemia 03/12/2014   HYPERTENSION 10/18/2007   Qualifier: Diagnosis of  By: Linna Darner, CMA, Cindy     Insomnia 03/12/2014   Irritable bowel syndrome 10/18/2007   Qualifier: Diagnosis of  By: Clent Ridges MD, Tera Mater    Osteopenia    Recurrent knee pain    R knee hx meniscal tear 2002 with repair   Type 2 diabetes mellitus with hyperglycemia (HCC)     Objective: BP 108/70 (BP Location: Right Arm, Patient Position: Sitting, Cuff Size: Large)   Pulse 84   Temp 98.6 F (37 C) (Oral)   Ht 5\' 6"  (1.676 m)   Wt 198 lb 8 oz (90 kg)   SpO2 97%   BMI 32.04 kg/m  General: Awake, appears stated age Heart: RRR, no LE edema Lungs: CTAB, no rales, wheezes or rhonchi. No accessory muscle use Psych: Age appropriate judgment and  insight, normal affect and mood  Assessment and Plan: Type 2 diabetes mellitus with hyperglycemia, without long-term current use of insulin (HCC) - Plan: Comprehensive metabolic panel, Lipid panel, Hemoglobin A1c, Microalbumin / creatinine urine ratio  Essential hypertension  Hyperlipidemia, unspecified hyperlipidemia type  Anxiety state  Chronic, stable.  Check labs.  Will find out about eye exam. Appears to be stable without medication.  Will stop Norvasc and Benicar.  She will monitor blood pressure at home and let us know if it starts to creep up again. Chronic, stable.  Continue Lipitor 20 mg daily.  Counseled on diet and exercise. Chronic,.  Continue Celexa 20 mg daily, Ambien 3.125 mg nightly as needed. She will come back for fasting labs.  I will see her in 6 months for physical or as needed. The patient voiced understanding and agreement to the plan.  [name] [place], DO 12/20/22  1:12 PM

## 2022-12-21 ENCOUNTER — Other Ambulatory Visit (INDEPENDENT_AMBULATORY_CARE_PROVIDER_SITE_OTHER): Payer: Medicare Other

## 2022-12-21 ENCOUNTER — Encounter: Payer: Self-pay | Admitting: Family Medicine

## 2022-12-21 DIAGNOSIS — E1165 Type 2 diabetes mellitus with hyperglycemia: Secondary | ICD-10-CM

## 2022-12-21 LAB — COMPREHENSIVE METABOLIC PANEL
ALT: 29 U/L (ref 0–35)
AST: 31 U/L (ref 0–37)
Albumin: 4 g/dL (ref 3.5–5.2)
Alkaline Phosphatase: 152 U/L — ABNORMAL HIGH (ref 39–117)
BUN: 14 mg/dL (ref 6–23)
CO2: 25 meq/L (ref 19–32)
Calcium: 9.9 mg/dL (ref 8.4–10.5)
Chloride: 106 meq/L (ref 96–112)
Creatinine, Ser: 0.94 mg/dL (ref 0.40–1.20)
GFR: 61.97 mL/min (ref >60.00)
Glucose, Bld: 142 mg/dL — ABNORMAL HIGH (ref 70–99)
Potassium: 4.5 meq/L (ref 3.5–5.1)
Sodium: 139 meq/L (ref 135–145)
Total Bilirubin: 0.4 mg/dL (ref 0.2–1.2)
Total Protein: 6.7 g/dL (ref 6.0–8.3)

## 2022-12-21 LAB — MICROALBUMIN / CREATININE URINE RATIO
Creatinine,U: 78.2 mg/dL
Microalb Creat Ratio: 0.9 mg/g (ref 0.0–30.0)
Microalb, Ur: <0.7 mg/dL (ref 0.0–1.9)

## 2022-12-21 LAB — LIPID PANEL
Cholesterol: 132 mg/dL (ref 0–200)
HDL: 41.3 mg/dL (ref >39.00)
LDL Cholesterol: 43 mg/dL (ref 0–99)
NonHDL: 90.79
Total CHOL/HDL Ratio: 3
Triglycerides: 240 mg/dL — ABNORMAL HIGH (ref 0.0–149.0)
VLDL: 48 mg/dL — ABNORMAL HIGH (ref 0.0–40.0)

## 2022-12-21 LAB — HEMOGLOBIN A1C: Hgb A1c MFr Bld: 7.5 % — ABNORMAL HIGH (ref 4.6–6.5)

## 2022-12-22 ENCOUNTER — Other Ambulatory Visit: Payer: Self-pay | Admitting: Family Medicine

## 2022-12-22 MED ORDER — METFORMIN HCL ER 500 MG PO TB24: 500.0000 mg | ORAL_TABLET | Freq: Every day | ORAL | 3 refills | Status: DC

## 2022-12-23 ENCOUNTER — Other Ambulatory Visit: Payer: Self-pay | Admitting: Family Medicine

## 2022-12-28 ENCOUNTER — Encounter (HOSPITAL_BASED_OUTPATIENT_CLINIC_OR_DEPARTMENT_OTHER): Payer: Self-pay | Admitting: Urology

## 2022-12-28 ENCOUNTER — Other Ambulatory Visit: Payer: Self-pay

## 2022-12-28 ENCOUNTER — Emergency Department (HOSPITAL_BASED_OUTPATIENT_CLINIC_OR_DEPARTMENT_OTHER)
Admission: EM | Admit: 2022-12-28 | Discharge: 2022-12-28 | Disposition: A | Discharge status: Home / Self Care | Payer: Medicare Other | Attending: Emergency Medicine | Admitting: Emergency Medicine

## 2022-12-28 DIAGNOSIS — Z20822 Contact with and (suspected) exposure to covid-19: Secondary | ICD-10-CM | POA: Diagnosis not present

## 2022-12-28 DIAGNOSIS — R519 Headache, unspecified: Secondary | ICD-10-CM | POA: Diagnosis not present

## 2022-12-28 LAB — RESP PANEL BY RT-PCR (RSV, FLU A&B, COVID)  RVPGX2
Influenza A by PCR: NEGATIVE
Influenza B by PCR: NEGATIVE
Resp Syncytial Virus by PCR: NEGATIVE
SARS Coronavirus 2 by RT PCR: NEGATIVE

## 2022-12-28 MED ORDER — METOCLOPRAMIDE HCL 5 MG/ML IJ SOLN
10.0000 mg | Freq: Once | INTRAMUSCULAR | Status: AC
  Administered 2022-12-28: 10 mg via INTRAVENOUS
  Filled 2022-12-28: qty 2

## 2022-12-28 MED ORDER — DIPHENHYDRAMINE HCL 50 MG/ML IJ SOLN
25.0000 mg | Freq: Once | INTRAMUSCULAR | Status: AC
  Administered 2022-12-28: 25 mg via INTRAVENOUS
  Filled 2022-12-28: qty 1

## 2022-12-28 MED ORDER — DEXAMETHASONE SODIUM PHOSPHATE 10 MG/ML IJ SOLN
10.0000 mg | Freq: Once | INTRAMUSCULAR | Status: AC
  Administered 2022-12-28: 10 mg via INTRAVENOUS
  Filled 2022-12-28: qty 1

## 2022-12-28 MED ORDER — KETOROLAC TROMETHAMINE 15 MG/ML IJ SOLN
30.0000 mg | Freq: Once | INTRAMUSCULAR | Status: AC
  Administered 2022-12-28: 30 mg via INTRAMUSCULAR
  Filled 2022-12-28: qty 2

## 2022-12-28 NOTE — Discharge Instructions (Signed)
Please read and follow all provided instructions.  Your diagnoses today include:  1. Acute nonintractable headache, unspecified headache type     Tests performed today include: Vital signs. See below for your results today.   Medications:  In the Emergency Department you received: Reglan - antinausea/headache medication Benadryl - antihistamine to counteract potential side effects of reglan Toradol - NSAID medication similar to ibuprofen Decadron - steroid medication for headache  Take any prescribed medications only as directed.  Additional information:  Follow any educational materials contained in this packet.  You are having a headache. No specific cause was found today for your headache. It may have been a migraine or other cause of headache. Stress, anxiety, fatigue, and depression are common triggers for headaches.   Your headache today does not appear to be life-threatening or require hospitalization, but often the exact cause of headaches is not determined in the emergency department. Therefore, follow-up with your doctor is very important to find out what may have caused your headache and whether or not you need any further diagnostic testing or treatment.   Sometimes headaches can appear benign (not harmful), but then more serious symptoms can develop which should prompt an immediate re-evaluation by your doctor or the emergency department.  BE VERY CAREFUL not to take multiple medicines containing Tylenol (also called acetaminophen). Doing so can lead to an overdose which can damage your liver and cause liver failure and possibly death.   Follow-up instructions: Please follow-up with your primary care provider in the next 3 days for further evaluation of your symptoms.   Return instructions:  Please return to the Emergency Department if you experience worsening symptoms. Return if the medications do not resolve your headache, if it recurs, or if you have multiple  episodes of vomiting or cannot keep down fluids. Return if you have a change from the usual headache. RETURN IMMEDIATELY IF you: Develop a sudden, severe headache Develop confusion or become poorly responsive or faint Develop a fever above 100.72F or problem breathing Have a change in speech, vision, swallowing, or understanding Develop new weakness, numbness, tingling, incoordination in your arms or legs Have a seizure Please return if you have any other emergent concerns.  Additional Information:  Your vital signs today were: BP (!) 134/104   Pulse 75   Temp 98.2 F (36.8 C)   Resp 16   Ht 5\' 6"  (1.676 m)   Wt 90 kg   SpO2 93%   BMI 32.02 kg/m  If your blood pressure (BP) was elevated above 135/85 this visit, please have this repeated by your doctor within one month. --------------

## 2022-12-28 NOTE — ED Triage Notes (Signed)
Pt states headache and sinus congestion x 3 days  Denies fever

## 2022-12-28 NOTE — ED Provider Notes (Signed)
[place] [facility] AT [facility] Provider Note   CSN: [id number] Arrival date & time: 12/28/22  1154     History  Chief Complaint  Patient presents with   Headache   Facial Pain    Karen Hall is a 67 y.o. female.  Patient presents to the emergency department today for evaluation of facial pain and headache.  Patient states that 3 days ago she developed a "sinus headache".  Pain was mild at first.  She has had similar symptoms in the past.  When she starts feeling the symptoms coming on she treats them with ice as well as nasal spray and Tylenol.  Symptoms persisted.  She states that she was told by her PCP in the past that if she gets about headache like this to come to the office for "a shot".  Her PCP was not available today, prompting emergency department visit.  She denies associated ear pain, runny nose, nasal congestion.  No sore throat.  No fevers, nausea or vomiting.  Again headache was mild at onset has gradually worsened.  No head injuries.  She is not anticoagulated.  Pain started in the left maxillary sinus area and spread to the bilateral frontal and maxillary sinus areas.  No swelling.       Home Medications Prior to Admission medications   Medication Sig Start Date End Date Taking? Authorizing Provider  atorvastatin (LIPITOR) 20 MG tablet TAKE 1 TABLET BY MOUTH EVERY DAY 10/13/22   Wendling, [name], DO  Cholecalciferol (VITAMIN D3) 125 MCG (5000 UT) CAPS Take 1 capsule by mouth daily.    [provider]  citalopram (CELEXA) 20 MG tablet TAKE 1 TABLET BY MOUTH  DAILY 12/21/21   Wendling, [name], DO  [employer] [facility] ALLERGY RELIEF NA) Place 1 application into the nose 2 (two) times daily as needed (congestion/allergies).     [provider]  Melatonin 5 MG CAPS Take 20 mg by mouth.    [provider]  Menthol, Topical Analgesic, (BIOFREEZE EX) Apply 1 application topically at bedtime as  needed (pain).    [provider]  metFORMIN (GLUCOPHAGE-XR) 500 MG 24 hr tablet TAKE 1 TABLET BY MOUTH EVERY DAY WITH BREAKFAST 12/24/22   Wendling, [name], DO  omeprazole (PRILOSEC) 20 MG capsule Take 1 capsule (20 mg total) by mouth daily. Please call [phone] to schedule an office visit for more refills. 08/30/22   Cirigliano, Vito V, DO  Polyethyl Glycol-Propyl Glycol (SYSTANE) 0.4-0.3 % SOLN Place 1 drop into both eyes daily as needed (dry eyes).    [provider]  zolpidem (AMBIEN CR) 6.25 MG CR tablet Take 1 tablet (6.25 mg total) by mouth at bedtime as needed. for sleep 11/03/22   Sharlene Dory, DO      Allergies    Aspartame and phenylalanine and Codeine    Review of Systems   Review of Systems  Physical Exam Updated Vital Signs BP (!) 129/98   Pulse 85   Temp 98.2 F (36.8 C)   Resp 16   Ht 5\' 6"  (1.676 m)   Wt 90 kg   SpO2 95%   BMI 32.02 kg/m   Physical Exam Vitals and nursing note reviewed.  Constitutional:      Appearance: She is well-developed.  HENT:     Head: Normocephalic and atraumatic.     Right Ear: Tympanic membrane, ear canal and external ear normal.     Left Ear: Tympanic membrane,  ear canal and external ear normal.     Nose:     Right Sinus: Maxillary sinus tenderness and frontal sinus tenderness present.     Left Sinus: Maxillary sinus tenderness and frontal sinus tenderness present.     Mouth/Throat:     Pharynx: Uvula midline.  Eyes:     General: Lids are normal.     Extraocular Movements:     Right eye: No nystagmus.     Left eye: No nystagmus.     Conjunctiva/sclera: Conjunctivae normal.     Pupils: Pupils are equal, round, and reactive to light.  Cardiovascular:     Rate and Rhythm: Normal rate and regular rhythm.  Pulmonary:     Effort: Pulmonary effort is normal.     Breath sounds: Normal breath sounds.  Abdominal:     Palpations: Abdomen is soft.     Tenderness: There is no abdominal  tenderness.  Musculoskeletal:     Cervical back: Normal range of motion and neck supple. No tenderness or bony tenderness.  Skin:    General: Skin is warm and dry.  Neurological:     Mental Status: She is alert and oriented to person, place, and time.     GCS: GCS eye subscore is 4. GCS verbal subscore is 5. GCS motor subscore is 6.     Cranial Nerves: No cranial nerve deficit.     Sensory: No sensory deficit.     Motor: No weakness.     Coordination: Coordination normal.     Gait: Gait normal.     Comments: Upper extremity myotomes tested bilaterally:  C5 Shoulder abduction 5/5 C6 Elbow flexion/wrist extension 5/5 C7 Elbow extension 5/5 C8 Finger flexion 5/5 T1 Finger abduction 5/5  Lower extremity myotomes tested bilaterally: L2 Hip flexion 5/5 L3 Knee extension 5/5 L4 Ankle dorsiflexion 5/5 S1 Ankle plantar flexion 5/5      ED Results / Procedures / Treatments   Labs (all labs ordered are listed, but only abnormal results are displayed) Labs Reviewed  RESP PANEL BY RT-PCR (RSV, FLU A&B, COVID)  [id number]    EKG None  Radiology No results found.  Procedures Procedures    Medications Ordered in ED Medications  ketorolac (TORADOL) 15 MG/ML injection 30 mg (30 mg Intramuscular Given 12/28/22 1334)  metoCLOPramide (REGLAN) injection 10 mg (10 mg Intravenous Given 12/28/22 1453)  diphenhydrAMINE (BENADRYL) injection 25 mg (25 mg Intravenous Given 12/28/22 1453)  dexamethasone (DECADRON) injection 10 mg (10 mg Intravenous Given 12/28/22 1452)    ED Course/ Medical Decision Making/ A&P    Patient seen and examined. History obtained directly from patient.   Labs/EKG: None ordered  Imaging: None ordered  Medications/Fluids: Ordered: IM Toradol.   Most recent vital signs reviewed and are as follows: BP (!) 129/98   Pulse 85   Temp 98.2 F (36.8 C)   Resp 16   Ht 5\' 6"  (1.676 m)   Wt 90 kg   SpO2 95%   BMI 32.02 kg/m   Initial impression: Frontal  headache, no red flags other than age.  Patient has had similar headaches in the past.  Will try IM Toradol, if not improving, will give additional migraine cocktail.  Patient states that she can get a ride home if needed.  2:50 PM patient reassessed.  She states that her sinus pain was better for about 20 minutes, but seems to be recurring.  Will plan to proceed with migraine cocktail including Reglan, Benadryl, Decadron.  4:12 PM  Reassessment performed. Patient appears improved.  She states that she tolerated medication well, and feels better, ready for discharge.  Labs personally reviewed and interpreted including: Respiratory panel negative.  Reviewed pertinent lab work and imaging with patient at bedside. Questions answered.   Most current vital signs reviewed and are as follows: BP (!) 134/104   Pulse 75   Temp 98.2 F (36.8 C)   Resp 16   Ht 5\' 6"  (1.676 m)   Wt 90 kg   SpO2 93%   BMI 32.02 kg/m   Plan: Discharge to home.   Prescriptions written for: None  Other home care instructions discussed: Avoidance of triggers, rest, good hydration  ED return instructions discussed: New or worsening symptoms, Patient counseled to return if they have weakness in their arms or legs, slurred speech, trouble walking or talking, confusion, trouble with their balance, or if they have any other concerns. Patient verbalizes understanding and agrees with plan.   Follow-up instructions discussed: Patient encouraged to follow-up with their PCP in 3 days.                                   Medical Decision Making Risk Prescription drug management.   In regards to the patient's headache, critical differentials were considered including subarachnoid hemorrhage, intracerebral hemorrhage, epidural/subdural hematoma, pituitary apoplexy, vertebral/carotid artery dissection, giant cell arteritis, central venous thrombosis, reversible cerebral vasoconstriction, acute angle closure glaucoma,  idiopathic intracranial hypertension, bacterial meningitis, viral encephalitis, carbon monoxide poisoning, posterior reversible encephalopathy syndrome, pre-eclampsia.   Reg flag symptoms related to these causes were considered including systemic symptoms (fever, weight loss), neurologic symptoms (confusion, mental status change, vision change, associated seizure), acute or sudden "thunderclap" onset, patient age 18 or older with new or progressive headache, patient of any age with first headache or change in headache pattern, pregnant or postpartum status, history of HIV or other immunocompromise, history of cancer, headache occurring with exertion, associated neck or shoulder pain, associated traumatic injury, concurrent use of anticoagulation, family history of spontaneous SAH, and concurrent drug use.    Other benign, more common causes of headache were considered including migraine, tension-type headache, cluster headache, referred pain from other cause such as sinus infection, dental pain, trigeminal neuralgia.   On exam, patient has a reassuring neuro exam including baseline mental status, no significant neck pain or meningeal signs, no signs of severe infection or fever.   The patient's vital signs, pertinent lab work and imaging were reviewed and interpreted as discussed in the ED course. Hospitalization was considered for further testing, treatments, or serial exams/observation. However as patient is well-appearing, has a stable exam over the course of their evaluation, and reassuring studies today, I do not feel that they warrant admission at this time. This plan was discussed with the patient who verbalizes agreement and comfort with this plan and seems reliable and able to return to the Emergency Department with worsening or changing symptoms.          Final Clinical Impression(s) / ED Diagnoses Final diagnoses:  Acute nonintractable headache, unspecified headache type    Rx / DC  Orders ED Discharge Orders     None         Renne Crigler, PA-C 12/28/22 1614    Virgina Norfolk, DO 12/31/22 1554

## 2023-01-03 ENCOUNTER — Encounter: Payer: Self-pay | Admitting: Family Medicine

## 2023-01-03 ENCOUNTER — Ambulatory Visit (INDEPENDENT_AMBULATORY_CARE_PROVIDER_SITE_OTHER): Payer: Medicare Other | Admitting: Family Medicine

## 2023-01-03 VITALS — BP 120/85 | HR 92 | Temp 97.9°F | Ht 66.0 in | Wt 196.5 lb

## 2023-01-03 DIAGNOSIS — I10 Essential (primary) hypertension: Secondary | ICD-10-CM

## 2023-01-03 DIAGNOSIS — R519 Headache, unspecified: Secondary | ICD-10-CM | POA: Diagnosis not present

## 2023-01-03 MED ORDER — METHYLPREDNISOLONE ACETATE 80 MG/ML IJ SUSP
80.0000 mg | Freq: Once | INTRAMUSCULAR | Status: AC
Start: 2023-01-03 — End: 2023-01-03
  Administered 2023-01-03: 80 mg via INTRAMUSCULAR

## 2023-01-03 MED ORDER — OLMESARTAN MEDOXOMIL 20 MG PO TABS
20.0000 mg | ORAL_TABLET | Freq: Every day | ORAL | Status: DC
Start: 2023-01-03 — End: 2023-03-23

## 2023-01-03 NOTE — Progress Notes (Signed)
Chief Complaint  Patient presents with   Follow-up    ER--Headache    Subjective: Patient is a 67 y.o. female here for fu.  Frontal/sinus headache starting this AM. Happened a week ago. Did not respond to Toradol, did well with the ED migraine cocktail. Usually triggered by allergies. No vision changes, trouble swallowing, difficulty w sleep, weakness, balance issues, paresthesias, N/V.   Hypertension Patient presents for hypertension follow up. She does monitor home blood pressures. Blood pressures ranging on average from 140-150's/90's. She had stopped her meds.  She is more commonly adhering to a healthy diet overall. Exercise: none No CP or SOB.    Past Medical History:  Diagnosis Date   Allergy    ANXIETY 10/18/2007   Qualifier: Diagnosis of  By: Clent Ridges MD, Tera Mater    Autoimmune hepatitis [facility])    Cataract    "beginnings of"   Chronic low back pain    GERD 10/18/2007   Qualifier: Diagnosis of  By: Linna Darner, CMA, Cindy     Hyperlipemia 03/12/2014   HYPERTENSION 10/18/2007   Qualifier: Diagnosis of  By: Linna Darner, CMA, Cindy     Insomnia 03/12/2014   Irritable bowel syndrome 10/18/2007   Qualifier: Diagnosis of  By: Clent Ridges MD, Tera Mater    Osteopenia    Recurrent knee pain    R knee hx meniscal tear 2002 with repair   Type 2 diabetes mellitus with hyperglycemia (HCC)     Objective: BP 120/85 (BP Location: Left Arm, Patient Position: Sitting, Cuff Size: Normal)   Pulse 92   Temp 97.9 F (36.6 C) (Oral)   Ht 5\' 6"  (1.676 m)   Wt 196 lb 8 oz (89.1 kg)   SpO2 93%   BMI 31.72 kg/m  General: Awake, appears stated age Heart: RRR, no LE edema Lungs: CTAB, no rales, wheezes or rhonchi. No accessory muscle use MSK: +TTP over traps b/l, worse on the L, no ttp over sinuses or temporalis, TMJ b/l Neuro: DTR's equal and symmetric throughout, no clonus, no cerebellar signs, gait cautious Psych: Age appropriate judgment and insight, normal affect and mood  Assessment and  Plan: Essential hypertension - Plan: olmesartan (BENICAR) 20 MG tablet  Sinus headache - Plan: methylPREDNISolone acetate (DEPO-MEDROL) injection 80 mg  Chronic, unstable.  Add back olmesartan 20 mg daily.  Counseled on diet and exercise.  Monitor blood pressure at home. Depo injection today.  Heat, ice, Tylenol, stretches and exercises for the trapezius region.  If she keeps getting recurrent headaches, could consider migraine abortive therapy versus prophylaxis versus referral to neurology. The patient voiced understanding and agreement to the plan.  [name] [place], DO 01/03/23  3:23 PM

## 2023-01-03 NOTE — Patient Instructions (Addendum)
Keep monitoring your blood pressure at home.   Keep the diet clean and stay active.  Let us know if you need anything.  Trapezius stretches/exercises Do exercises exactly as told by your health care provider and adjust them as directed. It is normal to feel mild stretching, pulling, tightness, or discomfort as you do these exercises, but you should stop right away if you feel sudden pain or your pain gets worse.   Stretching and range of motion exercises These exercises warm up your muscles and joints and improve the movement and flexibility of your shoulder. These exercises can also help to relieve pain, numbness, and tingling. If you are unable to do any of the following for any reason, do not further attempt to do it.   Exercise A: Flexion, standing     Stand and hold a broomstick, a cane, or a similar object. Place your hands a little more than shoulder-width apart on the object. Your left / right hand should be palm-up, and your other hand should be palm-down. Push the stick to raise your left / right arm out to your side and then over your head. Use your other hand to help move the stick. Stop when you feel a stretch in your shoulder, or when you reach the angle that is recommended by your health care provider. Avoid shrugging your shoulder while you raise your arm. Keep your shoulder blade tucked down toward your spine. Hold for 30 seconds. Slowly return to the starting position. Repeat 2 times. Complete this exercise 3 times per week.  Exercise B: Abduction, supine     Lie on your back and hold a broomstick, a cane, or a similar object. Place your hands a little more than shoulder-width apart on the object. Your left / right hand should be palm-up, and your other hand should be palm-down. Push the stick to raise your left / right arm out to your side and then over your head. Use your other hand to help move the stick. Stop when you feel a stretch in your shoulder, or when you reach  the angle that is recommended by your health care provider. Avoid shrugging your shoulder while you raise your arm. Keep your shoulder blade tucked down toward your spine. Hold for 30 seconds. Slowly return to the starting position. Repeat 2 times. Complete this exercise 3 times per week.  Exercise C: Flexion, active-assisted     Lie on your back. You may bend your knees for comfort. Hold a broomstick, a cane, or a similar object. Place your hands about shoulder-width apart on the object. Your palms should face toward your feet. Raise the stick and move your arms over your head and behind your head, toward the floor. Use your healthy arm to help your left / right arm move farther. Stop when you feel a gentle stretch in your shoulder, or when you reach the angle where your health care provider tells you to stop. Hold for 30 seconds. Slowly return to the starting position. Repeat 2 times. Complete this exercise 3 times per week.  Exercise D: External rotation and abduction     Stand in a door frame with one of your feet slightly in front of the other. This is called a staggered stance. Choose one of the following positions as told by your health care provider: Place your hands and forearms on the door frame above your head. Place your hands and forearms on the door frame at the height of your head.  Place your hands on the door frame at the height of your elbows. Slowly move your weight onto your front foot until you feel a stretch across your chest and in the front of your shoulders. Keep your head and chest upright and keep your abdominal muscles tight. Hold for 30 seconds. To release the stretch, shift your weight to your back foot. Repeat 2 times. Complete this stretch 3 times per week.  Strengthening exercises These exercises build strength and endurance in your shoulder. Endurance is the ability to use your muscles for a long time, even after your muscles get tired. Exercise E:  Scapular depression and adduction  Sit on a stable chair. Support your arms in front of you with pillows, armrests, or a tabletop. Keep your elbows in line with the sides of your body. Gently move your shoulder blades down toward your middle back. Relax the muscles on the tops of your shoulders and in the back of your neck. Hold for 3 seconds. Slowly release the tension and relax your muscles completely before doing this exercise again. Repeat for a total of 10 repetitions. After you have practiced this exercise, try doing the exercise without the arm support. Then, try the exercise while standing instead of sitting. Repeat 2 times. Complete this exercise 3 times per week.  Exercise F: Shoulder abduction, isometric     Stand or sit about 4-6 inches (10-15 cm) from a wall with your left / right side facing the wall. Bend your left / right elbow and gently press your elbow against the wall. Increase the pressure slowly until you are pressing as hard as you can without shrugging your shoulder. Hold for 3 seconds. Slowly release the tension and relax your muscles completely. Repeat for a total of 10 repetitions. Repeat 2 times. Complete this exercise 3 times per week.  Exercise G: Shoulder flexion, isometric     Stand or sit about 4-6 inches (10-15 cm) away from a wall with your left / right side facing the wall. Keep your left / right elbow straight and gently press the top of your fist against the wall. Increase the pressure slowly until you are pressing as hard as you can without shrugging your shoulder. Hold for 10-15 seconds. Slowly release the tension and relax your muscles completely. Repeat for a total of 10 repetitions. Repeat 2 times. Complete this exercise 3 times per week.  Exercise H: Internal rotation     Sit in a stable chair without armrests, or stand. Secure an exercise band at your left / right side, at elbow height. Place a soft object, such as a folded towel or a small  pillow, under your left / right upper arm so your elbow is a few inches (about 8 cm) away from your side. Hold the end of the exercise band so the band stretches. Keeping your elbow pressed against the soft object under your arm, move your forearm across your body toward your abdomen. Keep your body steady so the movement is only coming from your shoulder. Hold for 3 seconds. Slowly return to the starting position. Repeat for a total of 10 repetitions. Repeat 2 times. Complete this exercise 3 times per week.  Exercise I: External rotation     Sit in a stable chair without armrests, or stand. Secure an exercise band at your left / right side, at elbow height. Place a soft object, such as a folded towel or a small pillow, under your left / right upper arm so  your elbow is a few inches (about 8 cm) away from your side. Hold the end of the exercise band so the band stretches. Keeping your elbow pressed against the soft object under your arm, move your forearm out, away from your abdomen. Keep your body steady so the movement is only coming from your shoulder. Hold for 3 seconds. Slowly return to the starting position. Repeat for a total of 10 repetitions. Repeat 2 times. Complete this exercise 3 times per week. Exercise J: Shoulder extension  Sit in a stable chair without armrests, or stand. Secure an exercise band to a stable object in front of you so the band is at shoulder height. Hold one end of the exercise band in each hand. Your palms should face each other. Straighten your elbows and lift your hands up to shoulder height. Step back, away from the secured end of the exercise band, until the band stretches. Squeeze your shoulder blades together and pull your hands down to the sides of your thighs. Stop when your hands are straight down by your sides. Do not let your hands go behind your body. Hold for 3 seconds. Slowly return to the starting position. Repeat for a total of 10  repetitions. Repeat 2 times. Complete this exercise 3 times per week.  Exercise K: Shoulder extension, prone     Lie on your abdomen on a firm surface so your left / right arm hangs over the edge. Hold a 5 lb weight in your hand so your palm faces in toward your body. Your arm should be straight. Squeeze your shoulder blade down toward the middle of your back. Slowly raise your arm behind you, up to the height of the surface that you are lying on. Keep your arm straight. Hold for 3 seconds. Slowly return to the starting position and relax your muscles. Repeat for a total of 10 repetitions. Repeat 2 times. Complete this exercise 3 times per week.   Exercise L: Horizontal abduction, prone  Lie on your abdomen on a firm surface so your left / right arm hangs over the edge. Hold a 5 lb weight in your hand so your palm faces toward your feet. Your arm should be straight. Squeeze your shoulder blade down toward the middle of your back. Bend your elbow so your hand moves up, until your elbow is bent to an "L" shape (90 degrees). With your elbow bent, slowly move your forearm forward and up. Raise your hand up to the height of the surface that you are lying on. Your upper arm should not move, and your elbow should stay bent. At the top of the movement, your palm should face the floor. Hold for 3 seconds. Slowly return to the starting position and relax your muscles. Repeat for a total of 10 repetitions. Repeat 2 times. Complete this exercise 3 times per week.  Exercise M: Horizontal abduction, standing  Sit on a stable chair, or stand. Secure an exercise band to a stable object in front of you so the band is at shoulder height. Hold one end of the exercise band in each hand. Straighten your elbows and lift your hands straight in front of you, up to shoulder height. Your palms should face down, toward the floor. Step back, away from the secured end of the exercise band, until the band  stretches. Move your arms out to your sides, and keep your arms straight. Hold for 3 seconds. Slowly return to the starting position. Repeat for a total  of 10 repetitions. Repeat 2 times. Complete this exercise 3 times per week.  Exercise N: Scapular retraction and elevation  Sit on a stable chair, or stand. Secure an exercise band to a stable object in front of you so the band is at shoulder height. Hold one end of the exercise band in each hand. Your palms should face each other. Sit in a stable chair without armrests, or stand. Step back, away from the secured end of the exercise band, until the band stretches. Squeeze your shoulder blades together and lift your hands over your head. Keep your elbows straight. Hold for 3 seconds. Slowly return to the starting position. Repeat for a total of 10 repetitions. Repeat 2 times. Complete this exercise 3 times per week.  This information is not intended to replace advice given to you by your health care provider. Make sure you discuss any questions you have with your health care provider. Document Released: 03/01/2005 Document Revised: 11/06/2015 Document Reviewed: 01/16/2015 Elsevier Interactive Patient Education  2017 [employer].

## 2023-01-11 NOTE — Progress Notes (Signed)
ok 

## 2023-01-25 ENCOUNTER — Encounter: Payer: Self-pay | Admitting: Family Medicine

## 2023-01-27 ENCOUNTER — Other Ambulatory Visit: Payer: Self-pay | Admitting: Family Medicine

## 2023-03-17 ENCOUNTER — Encounter: Payer: Self-pay | Admitting: Family Medicine

## 2023-03-23 ENCOUNTER — Other Ambulatory Visit: Payer: Self-pay | Admitting: Family Medicine

## 2023-03-23 DIAGNOSIS — I10 Essential (primary) hypertension: Secondary | ICD-10-CM

## 2023-03-30 ENCOUNTER — Other Ambulatory Visit: Payer: Self-pay | Admitting: Family Medicine

## 2023-03-30 DIAGNOSIS — I10 Essential (primary) hypertension: Secondary | ICD-10-CM

## 2023-04-04 ENCOUNTER — Encounter: Payer: Self-pay | Admitting: Family Medicine

## 2023-04-04 ENCOUNTER — Other Ambulatory Visit: Payer: Self-pay | Admitting: Family Medicine

## 2023-04-04 ENCOUNTER — Ambulatory Visit (INDEPENDENT_AMBULATORY_CARE_PROVIDER_SITE_OTHER): Payer: PPO | Admitting: Family Medicine

## 2023-04-04 VITALS — BP 126/74 | HR 84 | Temp 98.0°F | Resp 16 | Ht 66.0 in | Wt 190.6 lb

## 2023-04-04 DIAGNOSIS — E782 Mixed hyperlipidemia: Secondary | ICD-10-CM | POA: Diagnosis not present

## 2023-04-04 DIAGNOSIS — Z7984 Long term (current) use of oral hypoglycemic drugs: Secondary | ICD-10-CM | POA: Diagnosis not present

## 2023-04-04 DIAGNOSIS — Z79899 Other long term (current) drug therapy: Secondary | ICD-10-CM

## 2023-04-04 DIAGNOSIS — R0789 Other chest pain: Secondary | ICD-10-CM

## 2023-04-04 DIAGNOSIS — E1165 Type 2 diabetes mellitus with hyperglycemia: Secondary | ICD-10-CM

## 2023-04-04 DIAGNOSIS — I10 Essential (primary) hypertension: Secondary | ICD-10-CM

## 2023-04-04 LAB — COMPREHENSIVE METABOLIC PANEL
ALT: 40 U/L — ABNORMAL HIGH (ref 0–35)
AST: 39 U/L — ABNORMAL HIGH (ref 0–37)
Albumin: 4.6 g/dL (ref 3.5–5.2)
Alkaline Phosphatase: 214 U/L — ABNORMAL HIGH (ref 39–117)
BUN: 25 mg/dL — ABNORMAL HIGH (ref 6–23)
CO2: 26 meq/L (ref 19–32)
Calcium: 10.7 mg/dL — ABNORMAL HIGH (ref 8.4–10.5)
Chloride: 101 meq/L (ref 96–112)
Creatinine, Ser: 1.16 mg/dL (ref 0.40–1.20)
GFR: 48.05 mL/min — ABNORMAL LOW (ref >60.00)
Glucose, Bld: 155 mg/dL — ABNORMAL HIGH (ref 70–99)
Potassium: 4.4 meq/L (ref 3.5–5.1)
Sodium: 136 meq/L (ref 135–145)
Total Bilirubin: 0.5 mg/dL (ref 0.2–1.2)
Total Protein: 7.7 g/dL (ref 6.0–8.3)

## 2023-04-04 LAB — LIPID PANEL
Cholesterol: 158 mg/dL (ref 0–200)
HDL: 45.1 mg/dL (ref >39.00)
LDL Cholesterol: 77 mg/dL (ref 0–99)
NonHDL: 113.12
Total CHOL/HDL Ratio: 4
Triglycerides: 182 mg/dL — ABNORMAL HIGH (ref 0.0–149.0)
VLDL: 36.4 mg/dL (ref 0.0–40.0)

## 2023-04-04 LAB — HEMOGLOBIN A1C: Hgb A1c MFr Bld: 6.9 % — ABNORMAL HIGH (ref 4.6–6.5)

## 2023-04-04 MED ORDER — VALSARTAN 80 MG PO TABS: 80.0000 mg | ORAL_TABLET | Freq: Every day | ORAL | 3 refills | Status: DC

## 2023-04-04 MED ORDER — ATORVASTATIN CALCIUM 40 MG PO TABS: 40.0000 mg | ORAL_TABLET | Freq: Every day | ORAL | 3 refills | Status: DC

## 2023-04-04 NOTE — Addendum Note (Signed)
Addended by: Mervin Kung A on: 04/04/2023 12:01 PM   Modules accepted: Orders

## 2023-04-04 NOTE — Patient Instructions (Addendum)
Give [place] 2-3 business days to get the results of your labs back.   Keep the diet clean and stay active.  Check your blood pressures 2-3 times per week, alternating the time of day you check it. If it is high, considering waiting 1-2 minutes and rechecking. If it gets higher, your anxiety is likely creeping up and we should avoid rechecking.   Let us know if you need anything.  Hip Exercises It is normal to feel mild stretching, pulling, tightness, or discomfort as you do these exercises, but you should stop right away if you feel sudden pain or your pain gets worse.   STRETCHING AND RANGE OF MOTION EXERCISES These exercises warm up your muscles and joints and improve the movement and flexibility of your hip. These exercises also help to relieve pain, numbness, and tingling. Exercise A: Hamstrings, Supine  Lie on your back. Loop a belt or towel over the ball of your left / right foot. The ball of your foot is on the walking surface, right under your toes. Straighten your left / right knee and slowly pull on the belt to raise your leg. Do not let your left / right knee bend while you do this. Keep your other leg flat on the floor. Raise the left / right leg until you feel a gentle stretch behind your left / right knee or thigh. Hold this position for 30 seconds. Slowly return your leg to the starting position. Repeat2 times. Complete this stretch 3 times per week. Exercise B: Hip Rotators  Lie on your back on a firm surface. Hold your left / right knee with your left / right hand. Hold your ankle with your other hand. Gently pull your left / right knee and rotate your lower leg toward your other shoulder. Pull until you feel a stretch in your buttocks. Keep your hips and shoulders firmly planted while you do this stretch. Hold this position for 30 seconds. Repeat 2 times. Complete this stretch 3 times per week. Exercise C: V-Sit (Hamstrings and Adductors)  Sit on the floor with your  legs extended in a large "V" shape. Keep your knees straight during this exercise. Start with your head and chest upright, then bend at your waist to reach for your left foot (position A). You should feel a stretch in your right inner thigh. Hold this position for 30 seconds. Then slowly return to the upright position. Bend at your waist to reach forward (position B). You should feel a stretch behind both of your thighs and knees. Hold this position for 30 seconds. Then slowly return to the upright position. Bend at your waist to reach for your right foot (position C). You should feel a stretch in your left inner thigh. Hold this position for 30 seconds. Then slowly return to the upright position. Repeat A, B, and C 2 times each. Complete this stretch 3 times per week. Exercise D: Lunge (Hip Flexors)  Place your left / right knee on the floor and bend your other knee so that is directly over your ankle. You should be half-kneeling. Keep good posture with your head over your shoulders. Tighten your buttocks to point your tailbone downward. This helps your back to keep from arching too much. You should feel a gentle stretch in the front of your left / right thigh and hip. If you do not feel any resistance, slightly slide your other foot forward and then slowly lunge forward so your knee once again lines up  over your ankle. Make sure your tailbone continues to point downward. Hold this position for 30 seconds. Repeat 2 times. Complete this stretch 3 times per week.  STRENGTHENING EXERCISES These exercises build strength and endurance in your hip. Endurance is the ability to use your muscles for a long time, even after they get tired. Exercise E: Bridge (Hip Extensors)  Lie on your back on a firm surface with your knees bent and your feet flat on the floor. Tighten your buttocks muscles and lift your bottom off the floor until the trunk of your body is level with your thighs. Do not arch your  back. You should feel the muscles working in your buttocks and the back of your thighs. If you do not feel these muscles, slide your feet 1-2 inches (2.5-5 cm) farther away from your buttocks. Hold this position for 3 seconds. Slowly lower your hips to the starting position. Repeat for a total of 10 repetitions. Let your muscles relax completely between repetitions. If this exercise is too easy, try doing it with your arms crossed over your chest. Repeat 2 times. Complete this exercise 3 times per week. Exercise F: Straight Leg Raises - Hip Abductors  Lie on your side with your left / right leg in the top position. Lie so your head, shoulder, knee, and hip line up with each other. You may bend your bottom knee to help you balance. Roll your hips slightly forward, so your hips are stacked directly over each other and your left / right knee is facing forward. Leading with your heel, lift your top leg 4-6 inches (10-15 cm). You should feel the muscles in your outer hip lifting. Do not let your foot drift forward. Do not let your knee roll toward the ceiling. Hold this position for 1 second. Slowly return to the starting position. Let your muscles relax completely between repetitions. Repeat for a total of 10 repetitions.  Repeat 2 times. Complete this exercise 3 times per week. Exercise G: Straight Leg Raises - Hip Adductors  Lie on your side with your left / right leg in the bottom position. Lie so your head, shoulder, knee, and hip line up. You may place your upper foot in front to help you balance. Roll your hips slightly forward, so your hips are stacked directly over each other and your left / right knee is facing forward. Tense the muscles in your inner thigh and lift your bottom leg 4-6 inches (10-15 cm). Hold this position for 1 second. Slowly return to the starting position. Let your muscles relax completely between repetitions. Repeat for a total of 10 repetitions. Repeat 2 times.  Complete this exercise 3 times per week. Exercise H: Straight Leg Raises - Quadriceps  Lie on your back with your left / right leg extended and your other knee bent. Tense the muscles in the front of your left / right thigh. When you do this, you should see your kneecap slide up or see increased dimpling just above your knee. Tighten these muscles even more and raise your leg 4-6 inches (10-15 cm) off the floor. Hold this position for 3 seconds. Keep these muscles tense as you lower your leg. Relax the muscles slowly and completely between repetitions. Repeat for a total of 10 repetitions. Repeat 2 times. Complete this exercise 3 times per week. Exercise I: Hip Abductors, Standing Tie one end of a rubber exercise band or tubing to a secure surface, such as a table or pole. Loop the  other end of the band or tubing around your left / right ankle. Keeping your ankle with the band or tubing directly opposite of the secured end, step away until there is tension in the tubing or band. Hold onto a chair as needed for balance. Lift your left / right leg out to your side. While you do this: Keep your back upright. Keep your shoulders over your hips. Keep your toes pointing forward. Make sure to use your hip muscles to lift your leg. Do not "throw" your leg or tip your body to lift your leg. Hold this position for 1 second. Slowly return to the starting position. Repeat for a total of 10 repetitions. Repeat 2 times. Complete this exercise 3 times per week. Exercise J: Squats (Quadriceps) Stand in a door frame so your feet and knees are in line with the frame. You may place your hands on the frame for balance. Slowly bend your knees and lower your hips like you are going to sit in a chair. Keep your lower legs in a straight-up-and-down position. Do not let your hips go lower than your knees. Do not bend your knees lower than told by your health care provider. If your hip pain increases, do not bend  as low. Hold this position for 1 second. Slowly push with your legs to return to standing. Do not use your hands to pull yourself to standing. Repeat for a total of 10 repetitions. Repeat 2 times. Complete this exercise 3 times per week. Make sure you discuss any questions you have with your health care provider. Document Released: 03/19/2005 Document Revised: 11/24/2015 Document Reviewed: 02/24/2015 Elsevier Interactive Patient Education  [employer].

## 2023-04-04 NOTE — Progress Notes (Signed)
Subjective:   Chief Complaint  Patient presents with   Follow-up    Follow up    Karen Hall is a 68 y.o. female here for follow-up of diabetes.   [place] does not routinely check her sugars.  Patient does not require insulin.   Medications include: metformin XR 500 mg/d Diet is healthy.  Exercise: none  Hypertension Patient presents for hypertension follow up. She does monitor home blood pressures. Blood pressures ranging on average from 80-140's/70-90's. She is compliant with medications. Patient has these side effects of medication: none Diet/exercise as above. No SOB. +intermittent chest pain. Not a/w exertion. Lasts for a few seconds. L side of chest, sharp. No jaw pain, SOB, arm pain.   Hyperlipidemia Patient presents for mixed hyperlipidemia follow up. Currently being treated with Lipitor 20 mg daily and compliance with treatment thus far has been good. She denies new myalgias. Diet/exercise as above. The patient is not known to have coexisting coronary artery disease.  Past Medical History:  Diagnosis Date   Allergy    ANXIETY 10/18/2007   Qualifier: Diagnosis of  By: Clent Ridges MD, Tera Mater    Autoimmune hepatitis [facility])    Cataract    "beginnings of"   Chronic low back pain    GERD 10/18/2007   Qualifier: Diagnosis of  By: Linna Darner, CMA, Cindy     Hyperlipemia 03/12/2014   HYPERTENSION 10/18/2007   Qualifier: Diagnosis of  By: Linna Darner, CMA, Cindy     Insomnia 03/12/2014   Irritable bowel syndrome 10/18/2007   Qualifier: Diagnosis of  By: Clent Ridges MD, Tera Mater    Osteopenia    Recurrent knee pain    R knee hx meniscal tear 2002 with repair   Type 2 diabetes mellitus with hyperglycemia (HCC)      Related testing: Retinal exam: Done Pneumovax: done  Objective:  BP 126/74   Pulse 84   Temp 98 F (36.7 C) (Oral)   Resp 16   Ht 5\' 6"  (1.676 m)   Wt 190 lb 9.6 oz (86.5 kg)   SpO2 98%   BMI 30.76 kg/m  General:  Well developed, well nourished, in no apparent  distress Skin:  Warm, no pallor or diaphoresis Lungs:  CTAB, no access msc use Cardio:  RRR, no bruits, no LE edema Psych: Age appropriate judgment and insight  Assessment:   Type 2 diabetes mellitus with hyperglycemia, without long-term current use of insulin (HCC)  Essential hypertension - Plan: valsartan (DIOVAN) 80 MG tablet  Mixed hyperlipidemia  High risk medication use - Plan: Drug Monitoring Panel [phone] , Urine   Plan:   Chronic, unsure if stable. Cont Metformin XR 500 mg/d. Counseled on diet and exercise. Chronic, not stable. 20 mg of olmesartan is too much, 10 mg is not enough. Will change to 80 mg of valsartan. Monitor BP at home. F/u in 1 mo. Chronic, unsure if stable.  Continue Lipitor 20 mg daily.  Could consider increasing the dosage versus adding fenofibrate. Update UDS and C [place]. Discussed pursuing further evaluation for this.  Because it is so infrequent and not associated with exertion, she would like to keep an eye on things and see if it becomes more frequent or bothersome. The patient voiced understanding and agreement to the plan.  [name] [place], DO 04/04/23 11:32 AM

## 2023-04-05 ENCOUNTER — Telehealth: Payer: Self-pay

## 2023-04-05 NOTE — Telephone Encounter (Signed)
Recheck Ca and lipids at next visit

## 2023-04-06 ENCOUNTER — Other Ambulatory Visit: Payer: Self-pay | Admitting: Family Medicine

## 2023-04-07 LAB — DRUG TOX MONITOR 1 W/CONF, ORAL FLD
Amphetamines: NEGATIVE ng/mL (ref <10)
Barbiturates: NEGATIVE ng/mL (ref <10)
Benzodiazepines: NEGATIVE ng/mL (ref <0.50)
Buprenorphine: NEGATIVE ng/mL (ref <0.10)
Cocaine: NEGATIVE ng/mL (ref <5.0)
Fentanyl: NEGATIVE ng/mL (ref <0.10)
Heroin Metabolite: NEGATIVE ng/mL (ref <1.0)
MARIJUANA: NEGATIVE ng/mL (ref <2.5)
MDMA: NEGATIVE ng/mL (ref <10)
Meprobamate: NEGATIVE ng/mL (ref <2.5)
Methadone: NEGATIVE ng/mL (ref <5.0)
Nicotine Metabolite: NEGATIVE ng/mL (ref <5.0)
Opiates: NEGATIVE ng/mL (ref <2.5)
Phencyclidine: NEGATIVE ng/mL (ref <10)
Tapentadol: NEGATIVE ng/mL (ref <5.0)
Tramadol: NEGATIVE ng/mL (ref <5.0)
Zolpidem: 5.9 ng/mL — ABNORMAL HIGH (ref <5.0)
Zolpidem: POSITIVE ng/mL — AB (ref <5.0)

## 2023-04-29 ENCOUNTER — Other Ambulatory Visit: Payer: Self-pay | Admitting: Family Medicine

## 2023-04-29 ENCOUNTER — Encounter: Payer: Self-pay | Admitting: Family Medicine

## 2023-04-29 MED ORDER — OLMESARTAN MEDOXOMIL 20 MG PO TABS: 10.0000 mg | ORAL_TABLET | Freq: Every day | ORAL | Status: AC

## 2023-04-29 MED ORDER — METOPROLOL SUCCINATE ER 25 MG PO TB24: 25.0000 mg | ORAL_TABLET | Freq: Every day | ORAL | 3 refills | Status: DC

## 2023-05-06 ENCOUNTER — Ambulatory Visit: Payer: HMO | Admitting: Family Medicine

## 2023-05-06 ENCOUNTER — Encounter: Payer: Self-pay | Admitting: Family Medicine

## 2023-05-06 VITALS — BP 128/80 | HR 86 | Temp 98.0°F | Resp 16 | Ht 66.0 in | Wt 195.0 lb

## 2023-05-06 DIAGNOSIS — I10 Essential (primary) hypertension: Secondary | ICD-10-CM

## 2023-05-06 NOTE — Patient Instructions (Signed)
Keep the diet clean and stay active.  Keep monitoring your BP at home. Send me some readings in 2 weeks.   Let us know if you need anything.

## 2023-05-06 NOTE — Progress Notes (Signed)
Chief Complaint  Patient presents with   Hypertension    Follow up BP    Subjective Karen Hall is a 68 y.o. female who presents for hypertension follow up. She does monitor home blood pressures. Blood pressures ranging from 130-140's/70-90's on average. She is compliant with medications- Toprol XL 25 mg/d Patient has these side effects of medication: none She is sometimes adhering to a healthy diet overall. Current exercise: starting to use the elliptical machine No CP or SOB.    Past Medical History:  Diagnosis Date   Allergy    ANXIETY 10/18/2007   Qualifier: Diagnosis of  By: Clent Ridges MD, Tera Mater    Autoimmune hepatitis [facility])    Cataract    "beginnings of"   Chronic low back pain    GERD 10/18/2007   Qualifier: Diagnosis of  By: Linna Darner, CMA, Cindy     Hyperlipemia 03/12/2014   HYPERTENSION 10/18/2007   Qualifier: Diagnosis of  By: Linna Darner, CMA, Cindy     Insomnia 03/12/2014   Irritable bowel syndrome 10/18/2007   Qualifier: Diagnosis of  By: Clent Ridges MD, Tera Mater    Osteopenia    Recurrent knee pain    R knee hx meniscal tear 2002 with repair   Type 2 diabetes mellitus with hyperglycemia (HCC)     Exam BP 128/80 (BP Location: Left Arm, Patient Position: Sitting)   Pulse 86   Temp 98 F (36.7 C) (Oral)   Resp 16   Ht 5\' 6"  (1.676 m)   Wt 195 lb (88.5 kg)   SpO2 95%   BMI 31.47 kg/m  General:  well developed, well nourished, in no apparent distress Heart: RRR, no bruits, no LE edema Lungs: clear to auscultation, no accessory muscle use Psych: well oriented with normal range of affect and appropriate judgment/insight  Essential hypertension - Plan: Basic metabolic panel  Chronic, not fully controlled. Add back Benicar 10 mg/d to Toprol XL 25 mg/d. Monitor BP at home, reck BMP in 1 week, I will see her in 1 mo. Counseled on diet and exercise. The patient voiced understanding and agreement to the plan.  [name] Hernando, DO 05/06/23  11:10 AM

## 2023-05-11 ENCOUNTER — Other Ambulatory Visit (INDEPENDENT_AMBULATORY_CARE_PROVIDER_SITE_OTHER): Payer: HMO

## 2023-05-11 ENCOUNTER — Encounter: Payer: Self-pay | Admitting: Family Medicine

## 2023-05-11 DIAGNOSIS — I10 Essential (primary) hypertension: Secondary | ICD-10-CM

## 2023-05-11 LAB — BASIC METABOLIC PANEL
BUN: 28 mg/dL — ABNORMAL HIGH (ref 6–23)
CO2: 26 meq/L (ref 19–32)
Calcium: 9.9 mg/dL (ref 8.4–10.5)
Chloride: 103 meq/L (ref 96–112)
Creatinine, Ser: 1.03 mg/dL (ref 0.40–1.20)
GFR: 55.38 mL/min — ABNORMAL LOW (ref >60.00)
Glucose, Bld: 149 mg/dL — ABNORMAL HIGH (ref 70–99)
Potassium: 4.4 meq/L (ref 3.5–5.1)
Sodium: 138 meq/L (ref 135–145)

## 2023-05-25 ENCOUNTER — Other Ambulatory Visit: Payer: Self-pay | Admitting: Family Medicine

## 2023-05-25 MED ORDER — CITALOPRAM HYDROBROMIDE 20 MG PO TABS: 20.0000 mg | ORAL_TABLET | Freq: Every day | ORAL | 3 refills | Status: DC

## 2023-05-25 MED ORDER — ZOLPIDEM TARTRATE ER 6.25 MG PO TBCR
6.2500 mg | EXTENDED_RELEASE_TABLET | Freq: Every evening | ORAL | 5 refills | Status: DC | PRN
Start: 2023-05-25 — End: 2023-12-06

## 2023-05-25 NOTE — Telephone Encounter (Signed)
 Copied from CRM [id number]. Topic: Clinical - Medication Refill >> May 25, 2023 11:29 AM Almira Coaster wrote: Most Recent Primary Care Visit:  Provider: [facility]  Department: [facility]  Visit Type: LAB VISIT  Date: 05/11/2023  Medication: zolpidem (AMBIEN CR) 6.25 MG CR tablet, citalopram (CELEXA) 20 MG tablet   Has the patient contacted their pharmacy? No (Agent: If no, request that the patient contact the pharmacy for the refill. If patient does not wish to contact the pharmacy document the reason why and proceed with request.) (Agent: If yes, when and what did the pharmacy advise?)  Is this the correct pharmacy for this prescription? Yes If no, delete pharmacy and type the correct one.  This is the patient's preferred pharmacy:  [id number] - [place], [place] - [address] [place] [address] Phone: [phone] Fax: [phone]    Has the prescription been filled recently? No  Is the patient out of the medication? Yes  Has the patient been seen for an appointment in the last year OR does the patient have an upcoming appointment? Yes  Can we respond through MyChart? Yes  Agent: Please be advised that Rx refills may take up to 3 business days. We ask that you follow-up with your pharmacy.

## 2023-05-25 NOTE — Telephone Encounter (Signed)
 Requesting: Ambien  Contract:04/04/23 UDS: 04/04/23 Last Visit: 05/06/23 Next Visit: None Last Refill: 11/03/22 #30 and 5RF  Please Advise

## 2023-06-03 ENCOUNTER — Ambulatory Visit: Payer: HMO | Admitting: Family Medicine

## 2023-06-15 DIAGNOSIS — R92323 Mammographic fibroglandular density, bilateral breasts: Secondary | ICD-10-CM | POA: Diagnosis not present

## 2023-06-15 DIAGNOSIS — C50211 Malignant neoplasm of upper-inner quadrant of right female breast: Secondary | ICD-10-CM | POA: Diagnosis not present

## 2023-06-15 DIAGNOSIS — Z170 Estrogen receptor positive status [ER+]: Secondary | ICD-10-CM | POA: Diagnosis not present

## 2023-06-15 DIAGNOSIS — Z853 Personal history of malignant neoplasm of breast: Secondary | ICD-10-CM | POA: Diagnosis not present

## 2023-07-03 ENCOUNTER — Other Ambulatory Visit: Payer: Self-pay | Admitting: Family Medicine

## 2023-07-12 DIAGNOSIS — M9902 Segmental and somatic dysfunction of thoracic region: Secondary | ICD-10-CM | POA: Diagnosis not present

## 2023-07-12 DIAGNOSIS — M47816 Spondylosis without myelopathy or radiculopathy, lumbar region: Secondary | ICD-10-CM | POA: Diagnosis not present

## 2023-07-12 DIAGNOSIS — M9905 Segmental and somatic dysfunction of pelvic region: Secondary | ICD-10-CM | POA: Diagnosis not present

## 2023-07-12 DIAGNOSIS — M4713 Other spondylosis with myelopathy, cervicothoracic region: Secondary | ICD-10-CM | POA: Diagnosis not present

## 2023-07-12 DIAGNOSIS — M9903 Segmental and somatic dysfunction of lumbar region: Secondary | ICD-10-CM | POA: Diagnosis not present

## 2023-07-12 DIAGNOSIS — M9901 Segmental and somatic dysfunction of cervical region: Secondary | ICD-10-CM | POA: Diagnosis not present

## 2023-07-12 DIAGNOSIS — R519 Headache, unspecified: Secondary | ICD-10-CM | POA: Diagnosis not present

## 2023-07-13 DIAGNOSIS — R519 Headache, unspecified: Secondary | ICD-10-CM | POA: Diagnosis not present

## 2023-07-13 DIAGNOSIS — M9905 Segmental and somatic dysfunction of pelvic region: Secondary | ICD-10-CM | POA: Diagnosis not present

## 2023-07-13 DIAGNOSIS — M9902 Segmental and somatic dysfunction of thoracic region: Secondary | ICD-10-CM | POA: Diagnosis not present

## 2023-07-13 DIAGNOSIS — M9903 Segmental and somatic dysfunction of lumbar region: Secondary | ICD-10-CM | POA: Diagnosis not present

## 2023-07-13 DIAGNOSIS — M4713 Other spondylosis with myelopathy, cervicothoracic region: Secondary | ICD-10-CM | POA: Diagnosis not present

## 2023-07-13 DIAGNOSIS — M47816 Spondylosis without myelopathy or radiculopathy, lumbar region: Secondary | ICD-10-CM | POA: Diagnosis not present

## 2023-07-13 DIAGNOSIS — M9901 Segmental and somatic dysfunction of cervical region: Secondary | ICD-10-CM | POA: Diagnosis not present

## 2023-07-18 DIAGNOSIS — M9901 Segmental and somatic dysfunction of cervical region: Secondary | ICD-10-CM | POA: Diagnosis not present

## 2023-07-18 DIAGNOSIS — R519 Headache, unspecified: Secondary | ICD-10-CM | POA: Diagnosis not present

## 2023-07-18 DIAGNOSIS — M9905 Segmental and somatic dysfunction of pelvic region: Secondary | ICD-10-CM | POA: Diagnosis not present

## 2023-07-18 DIAGNOSIS — M9902 Segmental and somatic dysfunction of thoracic region: Secondary | ICD-10-CM | POA: Diagnosis not present

## 2023-07-18 DIAGNOSIS — M9903 Segmental and somatic dysfunction of lumbar region: Secondary | ICD-10-CM | POA: Diagnosis not present

## 2023-07-18 DIAGNOSIS — M4713 Other spondylosis with myelopathy, cervicothoracic region: Secondary | ICD-10-CM | POA: Diagnosis not present

## 2023-07-18 DIAGNOSIS — M47816 Spondylosis without myelopathy or radiculopathy, lumbar region: Secondary | ICD-10-CM | POA: Diagnosis not present

## 2023-07-19 DIAGNOSIS — M4713 Other spondylosis with myelopathy, cervicothoracic region: Secondary | ICD-10-CM | POA: Diagnosis not present

## 2023-07-19 DIAGNOSIS — M9903 Segmental and somatic dysfunction of lumbar region: Secondary | ICD-10-CM | POA: Diagnosis not present

## 2023-07-19 DIAGNOSIS — M9902 Segmental and somatic dysfunction of thoracic region: Secondary | ICD-10-CM | POA: Diagnosis not present

## 2023-07-19 DIAGNOSIS — M9901 Segmental and somatic dysfunction of cervical region: Secondary | ICD-10-CM | POA: Diagnosis not present

## 2023-07-19 DIAGNOSIS — M47816 Spondylosis without myelopathy or radiculopathy, lumbar region: Secondary | ICD-10-CM | POA: Diagnosis not present

## 2023-07-19 DIAGNOSIS — M9905 Segmental and somatic dysfunction of pelvic region: Secondary | ICD-10-CM | POA: Diagnosis not present

## 2023-07-19 DIAGNOSIS — R519 Headache, unspecified: Secondary | ICD-10-CM | POA: Diagnosis not present

## 2023-07-20 DIAGNOSIS — R0981 Nasal congestion: Secondary | ICD-10-CM | POA: Diagnosis not present

## 2023-07-20 DIAGNOSIS — K219 Gastro-esophageal reflux disease without esophagitis: Secondary | ICD-10-CM | POA: Diagnosis not present

## 2023-07-20 DIAGNOSIS — R053 Chronic cough: Secondary | ICD-10-CM | POA: Diagnosis not present

## 2023-07-21 DIAGNOSIS — M47816 Spondylosis without myelopathy or radiculopathy, lumbar region: Secondary | ICD-10-CM | POA: Diagnosis not present

## 2023-07-21 DIAGNOSIS — M4713 Other spondylosis with myelopathy, cervicothoracic region: Secondary | ICD-10-CM | POA: Diagnosis not present

## 2023-07-21 DIAGNOSIS — M9902 Segmental and somatic dysfunction of thoracic region: Secondary | ICD-10-CM | POA: Diagnosis not present

## 2023-07-21 DIAGNOSIS — M9901 Segmental and somatic dysfunction of cervical region: Secondary | ICD-10-CM | POA: Diagnosis not present

## 2023-07-21 DIAGNOSIS — M9903 Segmental and somatic dysfunction of lumbar region: Secondary | ICD-10-CM | POA: Diagnosis not present

## 2023-07-21 DIAGNOSIS — M9905 Segmental and somatic dysfunction of pelvic region: Secondary | ICD-10-CM | POA: Diagnosis not present

## 2023-07-21 DIAGNOSIS — R519 Headache, unspecified: Secondary | ICD-10-CM | POA: Diagnosis not present

## 2023-07-25 DIAGNOSIS — M9902 Segmental and somatic dysfunction of thoracic region: Secondary | ICD-10-CM | POA: Diagnosis not present

## 2023-07-25 DIAGNOSIS — M4713 Other spondylosis with myelopathy, cervicothoracic region: Secondary | ICD-10-CM | POA: Diagnosis not present

## 2023-07-25 DIAGNOSIS — R519 Headache, unspecified: Secondary | ICD-10-CM | POA: Diagnosis not present

## 2023-07-25 DIAGNOSIS — M9903 Segmental and somatic dysfunction of lumbar region: Secondary | ICD-10-CM | POA: Diagnosis not present

## 2023-07-25 DIAGNOSIS — M9905 Segmental and somatic dysfunction of pelvic region: Secondary | ICD-10-CM | POA: Diagnosis not present

## 2023-07-25 DIAGNOSIS — M9901 Segmental and somatic dysfunction of cervical region: Secondary | ICD-10-CM | POA: Diagnosis not present

## 2023-07-25 DIAGNOSIS — M47816 Spondylosis without myelopathy or radiculopathy, lumbar region: Secondary | ICD-10-CM | POA: Diagnosis not present

## 2023-07-26 DIAGNOSIS — R519 Headache, unspecified: Secondary | ICD-10-CM | POA: Diagnosis not present

## 2023-07-26 DIAGNOSIS — M9903 Segmental and somatic dysfunction of lumbar region: Secondary | ICD-10-CM | POA: Diagnosis not present

## 2023-07-26 DIAGNOSIS — M9905 Segmental and somatic dysfunction of pelvic region: Secondary | ICD-10-CM | POA: Diagnosis not present

## 2023-07-26 DIAGNOSIS — M4713 Other spondylosis with myelopathy, cervicothoracic region: Secondary | ICD-10-CM | POA: Diagnosis not present

## 2023-07-26 DIAGNOSIS — M9902 Segmental and somatic dysfunction of thoracic region: Secondary | ICD-10-CM | POA: Diagnosis not present

## 2023-07-26 DIAGNOSIS — M9901 Segmental and somatic dysfunction of cervical region: Secondary | ICD-10-CM | POA: Diagnosis not present

## 2023-07-26 DIAGNOSIS — M47816 Spondylosis without myelopathy or radiculopathy, lumbar region: Secondary | ICD-10-CM | POA: Diagnosis not present

## 2023-07-28 DIAGNOSIS — M9905 Segmental and somatic dysfunction of pelvic region: Secondary | ICD-10-CM | POA: Diagnosis not present

## 2023-07-28 DIAGNOSIS — M9902 Segmental and somatic dysfunction of thoracic region: Secondary | ICD-10-CM | POA: Diagnosis not present

## 2023-07-28 DIAGNOSIS — M9901 Segmental and somatic dysfunction of cervical region: Secondary | ICD-10-CM | POA: Diagnosis not present

## 2023-07-28 DIAGNOSIS — M9903 Segmental and somatic dysfunction of lumbar region: Secondary | ICD-10-CM | POA: Diagnosis not present

## 2023-07-28 DIAGNOSIS — R519 Headache, unspecified: Secondary | ICD-10-CM | POA: Diagnosis not present

## 2023-07-28 DIAGNOSIS — M4713 Other spondylosis with myelopathy, cervicothoracic region: Secondary | ICD-10-CM | POA: Diagnosis not present

## 2023-07-28 DIAGNOSIS — M47816 Spondylosis without myelopathy or radiculopathy, lumbar region: Secondary | ICD-10-CM | POA: Diagnosis not present

## 2023-07-30 ENCOUNTER — Other Ambulatory Visit: Payer: Self-pay | Admitting: Family Medicine

## 2023-08-02 DIAGNOSIS — M47816 Spondylosis without myelopathy or radiculopathy, lumbar region: Secondary | ICD-10-CM | POA: Diagnosis not present

## 2023-08-02 DIAGNOSIS — M9902 Segmental and somatic dysfunction of thoracic region: Secondary | ICD-10-CM | POA: Diagnosis not present

## 2023-08-02 DIAGNOSIS — M9905 Segmental and somatic dysfunction of pelvic region: Secondary | ICD-10-CM | POA: Diagnosis not present

## 2023-08-02 DIAGNOSIS — M4713 Other spondylosis with myelopathy, cervicothoracic region: Secondary | ICD-10-CM | POA: Diagnosis not present

## 2023-08-02 DIAGNOSIS — M9901 Segmental and somatic dysfunction of cervical region: Secondary | ICD-10-CM | POA: Diagnosis not present

## 2023-08-02 DIAGNOSIS — M9903 Segmental and somatic dysfunction of lumbar region: Secondary | ICD-10-CM | POA: Diagnosis not present

## 2023-08-02 DIAGNOSIS — R519 Headache, unspecified: Secondary | ICD-10-CM | POA: Diagnosis not present

## 2023-08-16 DIAGNOSIS — M47816 Spondylosis without myelopathy or radiculopathy, lumbar region: Secondary | ICD-10-CM | POA: Diagnosis not present

## 2023-08-16 DIAGNOSIS — M4713 Other spondylosis with myelopathy, cervicothoracic region: Secondary | ICD-10-CM | POA: Diagnosis not present

## 2023-08-16 DIAGNOSIS — M9901 Segmental and somatic dysfunction of cervical region: Secondary | ICD-10-CM | POA: Diagnosis not present

## 2023-08-16 DIAGNOSIS — M9903 Segmental and somatic dysfunction of lumbar region: Secondary | ICD-10-CM | POA: Diagnosis not present

## 2023-08-16 DIAGNOSIS — M9905 Segmental and somatic dysfunction of pelvic region: Secondary | ICD-10-CM | POA: Diagnosis not present

## 2023-08-16 DIAGNOSIS — M9902 Segmental and somatic dysfunction of thoracic region: Secondary | ICD-10-CM | POA: Diagnosis not present

## 2023-08-16 DIAGNOSIS — R519 Headache, unspecified: Secondary | ICD-10-CM | POA: Diagnosis not present

## 2023-08-17 DIAGNOSIS — R519 Headache, unspecified: Secondary | ICD-10-CM | POA: Diagnosis not present

## 2023-08-17 DIAGNOSIS — M9901 Segmental and somatic dysfunction of cervical region: Secondary | ICD-10-CM | POA: Diagnosis not present

## 2023-08-17 DIAGNOSIS — M4713 Other spondylosis with myelopathy, cervicothoracic region: Secondary | ICD-10-CM | POA: Diagnosis not present

## 2023-08-17 DIAGNOSIS — R053 Chronic cough: Secondary | ICD-10-CM | POA: Diagnosis not present

## 2023-08-17 DIAGNOSIS — M9902 Segmental and somatic dysfunction of thoracic region: Secondary | ICD-10-CM | POA: Diagnosis not present

## 2023-08-17 DIAGNOSIS — M9903 Segmental and somatic dysfunction of lumbar region: Secondary | ICD-10-CM | POA: Diagnosis not present

## 2023-08-17 DIAGNOSIS — M9905 Segmental and somatic dysfunction of pelvic region: Secondary | ICD-10-CM | POA: Diagnosis not present

## 2023-08-17 DIAGNOSIS — M47816 Spondylosis without myelopathy or radiculopathy, lumbar region: Secondary | ICD-10-CM | POA: Diagnosis not present

## 2023-08-19 DIAGNOSIS — H52223 Regular astigmatism, bilateral: Secondary | ICD-10-CM | POA: Diagnosis not present

## 2023-08-19 DIAGNOSIS — E119 Type 2 diabetes mellitus without complications: Secondary | ICD-10-CM | POA: Diagnosis not present

## 2023-08-19 DIAGNOSIS — H524 Presbyopia: Secondary | ICD-10-CM | POA: Diagnosis not present

## 2023-08-19 DIAGNOSIS — H5213 Myopia, bilateral: Secondary | ICD-10-CM | POA: Diagnosis not present

## 2023-08-19 DIAGNOSIS — Z961 Presence of intraocular lens: Secondary | ICD-10-CM | POA: Diagnosis not present

## 2023-08-19 LAB — HM DIABETES EYE EXAM

## 2023-08-23 DIAGNOSIS — M9905 Segmental and somatic dysfunction of pelvic region: Secondary | ICD-10-CM | POA: Diagnosis not present

## 2023-08-23 DIAGNOSIS — M47816 Spondylosis without myelopathy or radiculopathy, lumbar region: Secondary | ICD-10-CM | POA: Diagnosis not present

## 2023-08-23 DIAGNOSIS — M9903 Segmental and somatic dysfunction of lumbar region: Secondary | ICD-10-CM | POA: Diagnosis not present

## 2023-08-23 DIAGNOSIS — M4713 Other spondylosis with myelopathy, cervicothoracic region: Secondary | ICD-10-CM | POA: Diagnosis not present

## 2023-08-23 DIAGNOSIS — R519 Headache, unspecified: Secondary | ICD-10-CM | POA: Diagnosis not present

## 2023-08-23 DIAGNOSIS — M9901 Segmental and somatic dysfunction of cervical region: Secondary | ICD-10-CM | POA: Diagnosis not present

## 2023-08-23 DIAGNOSIS — M9902 Segmental and somatic dysfunction of thoracic region: Secondary | ICD-10-CM | POA: Diagnosis not present

## 2023-08-24 DIAGNOSIS — M9902 Segmental and somatic dysfunction of thoracic region: Secondary | ICD-10-CM | POA: Diagnosis not present

## 2023-08-24 DIAGNOSIS — M47816 Spondylosis without myelopathy or radiculopathy, lumbar region: Secondary | ICD-10-CM | POA: Diagnosis not present

## 2023-08-24 DIAGNOSIS — M9901 Segmental and somatic dysfunction of cervical region: Secondary | ICD-10-CM | POA: Diagnosis not present

## 2023-08-24 DIAGNOSIS — M4713 Other spondylosis with myelopathy, cervicothoracic region: Secondary | ICD-10-CM | POA: Diagnosis not present

## 2023-08-24 DIAGNOSIS — M9905 Segmental and somatic dysfunction of pelvic region: Secondary | ICD-10-CM | POA: Diagnosis not present

## 2023-08-24 DIAGNOSIS — M9903 Segmental and somatic dysfunction of lumbar region: Secondary | ICD-10-CM | POA: Diagnosis not present

## 2023-08-24 DIAGNOSIS — R519 Headache, unspecified: Secondary | ICD-10-CM | POA: Diagnosis not present

## 2023-09-13 DIAGNOSIS — R053 Chronic cough: Secondary | ICD-10-CM | POA: Diagnosis not present

## 2023-09-13 DIAGNOSIS — I5032 Chronic diastolic (congestive) heart failure: Secondary | ICD-10-CM | POA: Diagnosis not present

## 2023-09-13 DIAGNOSIS — K219 Gastro-esophageal reflux disease without esophagitis: Secondary | ICD-10-CM | POA: Diagnosis not present

## 2023-09-16 ENCOUNTER — Other Ambulatory Visit: Payer: Self-pay | Admitting: Family Medicine

## 2023-10-28 ENCOUNTER — Ambulatory Visit: Admitting: Family Medicine

## 2023-11-04 ENCOUNTER — Ambulatory Visit (INDEPENDENT_AMBULATORY_CARE_PROVIDER_SITE_OTHER): Admitting: Family Medicine

## 2023-11-04 ENCOUNTER — Encounter: Payer: Self-pay | Admitting: Family Medicine

## 2023-11-04 VITALS — BP 126/78 | HR 100 | Temp 98.0°F | Resp 16 | Ht 66.0 in | Wt 203.0 lb

## 2023-11-04 DIAGNOSIS — Z7984 Long term (current) use of oral hypoglycemic drugs: Secondary | ICD-10-CM | POA: Diagnosis not present

## 2023-11-04 DIAGNOSIS — E1165 Type 2 diabetes mellitus with hyperglycemia: Secondary | ICD-10-CM | POA: Diagnosis not present

## 2023-11-04 DIAGNOSIS — J45991 Cough variant asthma: Secondary | ICD-10-CM

## 2023-11-04 DIAGNOSIS — I10 Essential (primary) hypertension: Secondary | ICD-10-CM

## 2023-11-04 MED ORDER — AIRSUPRA 90-80 MCG/ACT IN AERO: 1.0000 | INHALATION_SPRAY | Freq: Four times a day (QID) | RESPIRATORY_TRACT | 3 refills | Status: AC | PRN

## 2023-11-04 MED ORDER — QVAR REDIHALER 40 MCG/ACT IN AERB: INHALATION_SPRAY | RESPIRATORY_TRACT | 2 refills | Status: DC

## 2023-11-04 NOTE — Patient Instructions (Signed)
 Give us  2-3 business days to get the results of your labs back.   Keep the diet clean and stay active.  Let us  know if you need anything.

## 2023-11-04 NOTE — Progress Notes (Signed)
 Subjective:   Chief Complaint  Patient presents with   Follow-up    Follow Up    Karen Hall is a 67 y.o. female here for follow-up of diabetes.   Karen Hall does not routinely ck her sugars.  Patient does not require insulin.   Medications include: metformin  XR 500 mg/d Diet could be better.  Exercise: none  Hypertension Patient presents for hypertension follow up. She does not monitor home blood pressures. She is compliant with medications- Olmesartan  10 mg/d, Toprol  XL 25 mg/d. Patient has these side effects of medication: none Diet/exercise as above.  No Cp or SOB.   Duration: 2 weeks but seems to be recurrent over the past couple years Associated symptoms: rhinorrhea, wheezing, and productive cough Denies: sinus congestion, sinus pain, itchy watery eyes, ear fullness, ear pain, ear drainage, sore throat, shortness of breath, myalgia, and fevers Humidity and freshly cut grass seem to flare her symptoms. Treatment to date: cough drops, reflux medication, nasal sprays; not have seem to help. Sick contacts: No  Past Medical History:  Diagnosis Date   Allergy     ANXIETY 10/18/2007   Qualifier: Diagnosis of  By: Johnny MD, Garnette LABOR    Autoimmune hepatitis [facility])    Cataract    beginnings of   Chronic low back pain    GERD 10/18/2007   Qualifier: Diagnosis of  By: Delos, CMA, Cindy     Hyperlipemia 03/12/2014   HYPERTENSION 10/18/2007   Qualifier: Diagnosis of  By: Delos, CMA, Cindy     Insomnia 03/12/2014   Irritable bowel syndrome 10/18/2007   Qualifier: Diagnosis of  By: Johnny MD, Garnette LABOR    Osteopenia    Recurrent knee pain    R knee hx meniscal tear 2002 with repair   Type 2 diabetes mellitus with hyperglycemia (HCC)      Related testing: Retinal exam: Done Pneumovax: done  Objective:  BP 126/78 (BP Location: Left Arm, Patient Position: Sitting)   Pulse 100   Temp 98 F (36.7 C) (Oral)   Resp 16   Ht 5' 6 (1.676 m)   Wt 203 lb (92.1 kg)   SpO2 98%    BMI 32.77 kg/m  General:  Well developed, well nourished, in no apparent distress HEENT: Ear canals are patent without otorrhea, TMs negative bilaterally, nares are patent without rhinorrhea, MMM, no pharyngeal exudate or erythema, no sinus TTP bilaterally Lungs:  CTAB, no access msc use Cardio:  RRR, no bruits, no LE edema Psych: Age appropriate judgment and insight  Assessment:   Type 2 diabetes mellitus with hyperglycemia, without long-term current use of insulin (HCC) - Plan: Comprehensive metabolic panel with GFR, Lipid panel, Hemoglobin A1c, Microalbumin / creatinine urine ratio  Essential hypertension  Cough variant asthma - Plan: Albuterol -Budesonide (AIRSUPRA ) 90-80 MCG/ACT AERO, beclomethasone (QVAR  REDIHALER) 40 MCG/ACT inhaler   Plan:   Chronic, stable. Cont metformin  XR 500 mg/d. Counseled on diet and exercise. Chronic, stable. Cont Toprol  XL 25 mg/d, olmesartan  10 mg/d.  Suspect asthma given triggers. Airsupra  prn. Qvar  daily prn.  Rinse mouth out after use.  I do not think any medications are contributing to her cough.  Does not seem like reflux is contributing either given lack of response to twice daily omeprazole .  F/u in 1 mo to reck. F/u in 6 mo. The patient voiced understanding and agreement to the plan.  Karen Mt Palmer, DO 11/04/23 4:44 PM

## 2023-11-07 ENCOUNTER — Other Ambulatory Visit (INDEPENDENT_AMBULATORY_CARE_PROVIDER_SITE_OTHER)

## 2023-11-07 DIAGNOSIS — E1165 Type 2 diabetes mellitus with hyperglycemia: Secondary | ICD-10-CM | POA: Diagnosis not present

## 2023-11-07 LAB — COMPREHENSIVE METABOLIC PANEL WITH GFR
ALT: 29 U/L (ref 0–35)
AST: 26 U/L (ref 0–37)
Albumin: 3.9 g/dL (ref 3.5–5.2)
Alkaline Phosphatase: 153 U/L — ABNORMAL HIGH (ref 39–117)
BUN: 14 mg/dL (ref 6–23)
CO2: 28 meq/L (ref 19–32)
Calcium: 9.6 mg/dL (ref 8.4–10.5)
Chloride: 100 meq/L (ref 96–112)
Creatinine, Ser: 0.85 mg/dL (ref 0.40–1.20)
GFR: 69.5 mL/min (ref >60.00)
Glucose, Bld: 137 mg/dL — ABNORMAL HIGH (ref 70–99)
Potassium: 4.6 meq/L (ref 3.5–5.1)
Sodium: 137 meq/L (ref 135–145)
Total Bilirubin: 0.5 mg/dL (ref 0.2–1.2)
Total Protein: 6.8 g/dL (ref 6.0–8.3)

## 2023-11-07 LAB — LIPID PANEL
Cholesterol: 160 mg/dL (ref 0–200)
HDL: 41.4 mg/dL (ref >39.00)
LDL Cholesterol: 62 mg/dL (ref 0–99)
NonHDL: 118.56
Total CHOL/HDL Ratio: 4
Triglycerides: 285 mg/dL — ABNORMAL HIGH (ref 0.0–149.0)
VLDL: 57 mg/dL — ABNORMAL HIGH (ref 0.0–40.0)

## 2023-11-07 LAB — MICROALBUMIN / CREATININE URINE RATIO
Creatinine,U: 67.9 mg/dL
Microalb Creat Ratio: UNDETERMINED mg/g (ref 0.0–30.0)
Microalb, Ur: <0.7 mg/dL

## 2023-11-07 LAB — HEMOGLOBIN A1C: Hgb A1c MFr Bld: 9.3 % — ABNORMAL HIGH (ref 4.6–6.5)

## 2023-11-08 ENCOUNTER — Ambulatory Visit: Payer: Self-pay | Admitting: Family Medicine

## 2023-11-10 ENCOUNTER — Other Ambulatory Visit: Payer: Self-pay | Admitting: Family Medicine

## 2023-11-10 MED ORDER — EMPAGLIFLOZIN 25 MG PO TABS: 25.0000 mg | ORAL_TABLET | Freq: Every day | ORAL | 2 refills | Status: DC

## 2023-11-10 MED ORDER — EMPAGLIFLOZIN 10 MG PO TABS: 10.0000 mg | ORAL_TABLET | Freq: Every day | ORAL | 0 refills | Status: AC

## 2023-12-01 ENCOUNTER — Ambulatory Visit (INDEPENDENT_AMBULATORY_CARE_PROVIDER_SITE_OTHER): Admitting: *Deleted

## 2023-12-01 VITALS — Ht 66.0 in | Wt 203.0 lb

## 2023-12-01 DIAGNOSIS — Z0000 Encounter for general adult medical examination without abnormal findings: Secondary | ICD-10-CM

## 2023-12-01 NOTE — Progress Notes (Addendum)
 Please attest this visit in the absence of patient primary care provider.    Subjective:   Karen Hall is a 68 y.o. who presents for a Medicare Wellness preventive visit.  As a reminder, Annual Wellness Visits don't include a physical exam, and some assessments may be limited, especially if this visit is performed virtually. We may recommend an in-person follow-up visit with your provider if needed.  Visit Complete: Virtual I connected with  Tanish A Goel on 12/01/23 by a audio enabled telemedicine application and verified that I am speaking with the correct person using two identifiers.  Patient Location: Home  Provider Location: Office/Clinic  I discussed the limitations of evaluation and management by telemedicine. The patient expressed understanding and agreed to proceed.  Vital Signs: Because this visit was a virtual/telehealth visit, some criteria may be missing or patient reported. Any vitals not documented were not able to be obtained and vitals that have been documented are patient reported.  VideoDeclined- This patient declined Librarian, academic. Therefore the visit was completed with audio only.  Persons Participating in Visit: Patient.  AWV Questionnaire: No: Patient Medicare AWV questionnaire was not completed prior to this visit.  Cardiac Risk Factors include: advanced age (>61men, >70 women);diabetes mellitus;dyslipidemia;hypertension     Objective:    Today's Vitals   12/01/23 1034  Weight: 203 lb (92.1 kg)  Height: 5' 6 (1.676 m)   Body mass index is 32.77 kg/m.     12/01/2023   10:44 AM 12/28/2022   12:12 PM 04/07/2022   11:50 AM 10/26/2019    2:13 PM 06/27/2019    4:09 PM 06/27/2019    7:51 AM 06/16/2019    5:46 PM  Advanced Directives  Does Patient Have a Medical Advance Directive? Yes No Yes No  Yes Yes  Type of Estate agent of [place];Living will  Healthcare Power of [place];Living will   Healthcare Power of [place];Living will Healthcare Power of [place];Living will Healthcare Power of Attorney  Does patient want to make changes to medical advance directive? No - Patient declined    No - Patient declined  No - Patient declined  Copy of Healthcare Power of Attorney in Chart? No - copy requested  No - copy requested   No - copy requested No - copy requested  Would patient like information on creating a medical advance directive?       No - Patient declined    Current Medications (verified) Outpatient Encounter Medications as of 12/01/2023  Medication Sig   Albuterol -Budesonide (AIRSUPRA ) 90-80 MCG/ACT AERO Inhale 1-2 puffs into the lungs every 6 (six) hours as needed (Cough). Rinse mouth out after use.   atorvastatin  (LIPITOR) 40 MG tablet TAKE 1 TABLET BY MOUTH EVERY DAY   beclomethasone (QVAR  REDIHALER) 40 MCG/ACT inhaler Take 2 puffs twice daily as needed when you are exposed to a trigger. Rinse mouth out after use.   citalopram  (CELEXA ) 20 MG tablet Take 1 tablet (20 mg total) by mouth daily.   empagliflozin  (JARDIANCE ) 10 MG TABS tablet Take 1 tablet (10 mg total) by mouth daily before breakfast.   Homeopathic Products (ZICAM ALLERGY  RELIEF NA) Place 1 application into the nose 2 (two) times daily as needed (congestion/allergies).    Melatonin 5 MG CAPS Take 20 mg by mouth.   Menthol, Topical Analgesic, (BIOFREEZE EX) Apply 1 application topically at bedtime as needed (pain).   metFORMIN  (GLUCOPHAGE -XR) 500 MG 24 hr tablet TAKE 1 TABLET BY MOUTH EVERY  DAY WITH BREAKFAST   metoprolol  succinate (TOPROL -XL) 25 MG 24 hr tablet TAKE 1 TABLET (25 MG TOTAL) BY MOUTH DAILY.   olmesartan  (BENICAR ) 20 MG tablet Take 0.5 tablets (10 mg total) by mouth daily.   omeprazole  (PRILOSEC) 20 MG capsule Take 1 capsule (20 mg total) by mouth daily. Please call [phone] to schedule an office visit for more refills.   Polyethyl Glycol-Propyl Glycol (SYSTANE) 0.4-0.3 % SOLN Place 1 drop into  both eyes daily as needed (dry eyes).   zolpidem  (AMBIEN  CR) 6.25 MG CR tablet Take 1 tablet (6.25 mg total) by mouth at bedtime as needed. for sleep   [START ON 12/10/2023] empagliflozin  (JARDIANCE ) 25 MG TABS tablet Take 1 tablet (25 mg total) by mouth daily before breakfast. (Patient not taking: Reported on 12/01/2023)   No facility-administered encounter medications on file as of 12/01/2023.    Allergies (verified) Aspartame and phenylalanine and Codeine   History: Past Medical History:  Diagnosis Date   Allergy     ANXIETY 10/18/2007   Qualifier: Diagnosis of  By: Johnny MD, Garnette LABOR    Autoimmune hepatitis [facility])    Cataract    beginnings of   Chronic low back pain    GERD 10/18/2007   Qualifier: Diagnosis of  By: Delos, CMA, Cindy     Hyperlipemia 03/12/2014   HYPERTENSION 10/18/2007   Qualifier: Diagnosis of  By: Delos, CMA, Cindy     Insomnia 03/12/2014   Irritable bowel syndrome 10/18/2007   Qualifier: Diagnosis of  By: Johnny MD, Garnette LABOR    Osteopenia    Recurrent knee pain    R knee hx meniscal tear 2002 with repair   Type 2 diabetes mellitus with hyperglycemia (HCC)    Past Surgical History:  Procedure Laterality Date   BREAST EXCISIONAL BIOPSY Left    Fibroid removed, 24 years ago    BREAST SURGERY     fibroid   CATARACT EXTRACTION Bilateral 09/2018   CHOLECYSTECTOMY     COLONOSCOPY     Around 2010   ESOPHAGOGASTRODUODENOSCOPY (EGD) WITH PROPOFOL  N/A 06/27/2019   Procedure: ESOPHAGOGASTRODUODENOSCOPY (EGD) WITH PROPOFOL ;  Surgeon: San Sandor GAILS, DO;  Location: [facility];  Service: Gastroenterology;  Laterality: N/A;   FOOT SURGERY Right    LIVER BIOPSY  02/2018   MALONEY DILATION  06/27/2019   Procedure: MALONEY DILATION;  Surgeon: San Sandor GAILS, DO;  Location: [facility];  Service: Gastroenterology;;   MENISCUS REPAIR Right    TONSILLECTOMY     TRANSORAL INCISIONLESS FUNDOPLICATION N/A 06/27/2019   Procedure: TRANSORAL INCISIONLESS FUNDOPLICATION;   Surgeon: San Sandor GAILS, DO;  Location: [facility];  Service: Gastroenterology;  Laterality: N/A;   Family History  Problem Relation Age of Onset   Cancer Mother        melanoma   Hyperlipidemia Mother    Breast cancer Mother    Liver cancer Father    Cancer Maternal Grandfather        unknown type   Heart disease Paternal Grandfather    Diabetes Maternal Aunt    Diabetes Sister    Lung disease Neg Hx    Colon cancer Neg Hx    Esophageal cancer Neg Hx    Rectal cancer Neg Hx    Stomach cancer Neg Hx    Social History   Socioeconomic History   Marital status: Widowed    Spouse name: Not on file   Number of children: 0   Years of education: Not on file  Highest education level: 12th grade  Occupational History   Not on file  Tobacco Use   Smoking status: Never   Smokeless tobacco: Never  Vaping Use   Vaping status: Never Used  Substance and Sexual Activity   Alcohol use: Not Currently   Drug use: No   Sexual activity: Not on file  Other Topics Concern   Not on file  Social History Narrative   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 She has lived in [place], [place] , [place] , & [place] . She has a dog currently. No bird exposure. No hot tub exposure. No mold exposure. Currently works as a Theatre manager.       Social Drivers of Corporate investment banker Strain: Low Risk  (12/01/2023)   Overall Financial Resource Strain (CARDIA)    Difficulty of Paying Living Expenses: Not very hard  Food Insecurity: No Food Insecurity (12/01/2023)   Hunger Vital Sign    Worried About Running Out of Food in the Last Year: Never true    Ran Out of Food in the Last Year: Never true  Transportation Needs: No Transportation Needs (12/01/2023)   PRAPARE - Administrator, Civil Service (Medical):  No    Lack of Transportation (Non-Medical): No  Physical Activity: Inactive (12/01/2023)   Exercise Vital Sign    Days of Exercise per Week: 0 days    Minutes of Exercise per Session: 0 min  Stress: No Stress Concern Present (12/01/2023)   [employer] of Occupational Health - Occupational Stress Questionnaire    Feeling of Stress: Not at all  Social Connections: Socially Isolated (12/01/2023)   Social Connection and Isolation Panel    Frequency of Communication with Friends and Family: More than three times a week    Frequency of Social Gatherings with Friends and Family: More than three times a week    Attends Religious Services: Never    Database administrator or Organizations: No    Attends Banker Meetings: Never    Marital Status: Widowed    Tobacco Counseling Counseling given: Not Answered    Clinical Intake:  Pre-visit preparation completed: Yes  Pain : No/denies pain     BMI - recorded: 32.77 Nutritional Status: BMI > 30  Obese Nutritional Risks: None Diabetes: Yes CBG done?: No Did pt. bring in CBG monitor from home?: No  Lab Results  Component Value Date   HGBA1C 9.3 (H) 11/07/2023   HGBA1C 6.9 (H) 04/04/2023   HGBA1C 7.5 (H) 12/21/2022     How often do you need to have someone help you when you read instructions, pamphlets, or other written materials from your doctor or pharmacy?: 1 - Never What is the last grade level you completed in school?: 12  Interpreter Needed?: No  Information entered by :: Dreama Kuna, CMA(AAMA)   Activities of Daily Living     12/01/2023   10:36 AM  In your present state of health, do you have any difficulty performing the following activities:  Hearing? 0  Vision? 0  Difficulty concentrating or making decisions? 0  Walking or climbing stairs? 0  Dressing or bathing? 0  Doing errands, shopping? 0  Preparing Food and eating ? N  Using the Toilet?  N  In the past six months, have you accidently  leaked urine? Y  Comment when coughing  Do you have problems with loss of bowel control? N  Managing your Medications? N  Managing your Finances? N  Housekeeping or managing your Housekeeping? N    Patient Care Team: Frann Mabel Mt, DO as PCP - General (Family Medicine) [facility] (Optometry)  I have updated your Care Teams any recent Medical Services you may have received from other providers in the past year.     Assessment:   This is a routine wellness examination for Juletta.  Hearing/Vision screen Hearing Screening - Comments:: Denies hearing difficulties.  Vision Screening - Comments:: Up to date with routine eye exams with [facility]    Goals Addressed               This Visit's Progress     Patient Stated (pt-stated)        Increase activity to build strength in muscles to be able to go sailing in the spring.       Depression Screen     12/01/2023   10:40 AM 05/06/2023   10:52 AM 04/07/2022   11:49 AM 08/28/2021    2:59 PM 08/12/2020    1:51 PM 04/12/2017   11:19 AM 04/08/2015    9:22 AM  PHQ 2/9 Scores  PHQ - 2 Score 0 0 0 0 0 0 0  PHQ- 9 Score 5 0         Fall Risk     12/01/2023   10:45 AM 05/06/2023   10:52 AM 04/04/2023   10:57 AM 04/07/2022   11:50 AM 04/06/2022   10:47 AM  Fall Risk   Falls in the past year? 1 0 0 1 1  Comment lost balance while cleaning and tripped on the other fall      Number falls in past yr: 1 0 0 1 0  Injury with Fall? 0 0 0 0 0  Risk for fall due to : Impaired balance/gait   Impaired vision   Follow up Education provided Falls evaluation completed Falls evaluation completed Falls prevention discussed       Data saved with a previous flowsheet row definition    MEDICARE RISK AT HOME:  Medicare Risk at Home Any stairs in or around the home?: No If so, are there any without handrails?: No Home free of loose throw rugs in walkways, pet beds, electrical cords, etc?: Yes Adequate lighting in your home to reduce  risk of falls?: Yes Life alert?: No Use of a cane, walker or w/c?: No Grab bars in the bathroom?: Yes (in shower) Shower chair or bench in shower?: Yes Elevated toilet seat or a handicapped toilet?: No  TIMED UP AND GO:  Was the test performed?  No,audio  Cognitive Function: 6CIT completed      11/03/2020    2:00 PM  Montreal Cognitive Assessment   Visuospatial/ Executive (0/5) 4  Naming (0/3) 3  Attention: Read list of digits (0/2) 2  Attention: Read list of letters (0/1) 1  Attention: Serial 7 subtraction starting at 100 (0/3) 2  Language: Repeat phrase (0/2) 2  Language : Fluency (0/1) 0  Abstraction (0/2) 1  Delayed Recall (0/5) 2  Orientation (0/6) 6  Total 23  Adjusted Score (based on education) 24      12/01/2023   10:47 AM  6CIT Screen  What Year? 0 points  What month? 0 points  What time?  0 points  Count back from 20 0 points  Months in reverse 0 points  Repeat phrase 2 points  Total Score 2 points    Immunizations Immunization History  Administered Date(s) Administered   Hep A / Hep B 02/17/2018, 03/21/2018   Influenza,inj,Quad PF,6+ Mos 11/29/2014   Janssen (J&J) SARS-COV-2 Vaccination 08/08/2019   Pneumococcal Polysaccharide-23 05/01/2015   Tdap 03/12/2014   Zoster, Live 04/22/2014    Screening Tests Health Maintenance  Topic Date Due   Influenza Vaccine  02/23/2026 (Originally 10/14/2023)   FOOT EXAM  12/20/2023   DTaP/Tdap/Td (2 - Td or Tdap) 03/12/2024   HEMOGLOBIN A1C  05/09/2024   OPHTHALMOLOGY EXAM  08/18/2024   Diabetic kidney evaluation - eGFR measurement  11/06/2024   Diabetic kidney evaluation - Urine ACR  11/06/2024   Medicare Annual Wellness (AWV)  11/30/2024   Colonoscopy  06/27/2029   DEXA SCAN  Completed   Hepatitis C Screening  Completed   HPV VACCINES  Aged Out   Meningococcal B Vaccine  Aged Out   Pneumococcal Vaccine: 50+ Years  Discontinued   Hepatitis B Vaccines 19-59 Average Risk  Discontinued   Mammogram   Discontinued   COVID-19 Vaccine  Discontinued   Zoster Vaccines- Shingrix  Discontinued    Health Maintenance Items Addressed: Will get name of facility of last DEXA and mammogram.  Will tetanus booster at pharmacy in December.  Additional Screening:  Vision Screening: Recommended annual ophthalmology exams for early detection of glaucoma and other disorders of the eye. Is the patient up to date with their annual eye exam?  Yes  Who is the provider or what is the name of the office in which the patient attends annual eye exams? [facility]  Dental Screening: Recommended annual dental exams for proper oral hygiene  Community Resource Referral / Chronic Care Management: CRR required this visit?  No   CCM required this visit?  No   Plan:    I have personally reviewed and noted the following in the patient's chart:   Medical and social history Use of alcohol, tobacco or illicit drugs  Current medications and supplements including opioid prescriptions. Patient is not currently taking opioid prescriptions. Functional ability and status Nutritional status Physical activity Advanced directives List of other physicians Hospitalizations, surgeries, and ER visits in previous 12 months Vitals Screenings to include cognitive, depression, and falls Referrals and appointments  In addition, I have reviewed and discussed with patient certain preventive protocols, quality metrics, and best practice recommendations. A written personalized care plan for preventive services as well as general preventive health recommendations were provided to patient.   Lolita Libra, CMA   12/01/2023   After Visit Summary: (MyChart) Due to this being a telephonic visit, the after visit summary with patients personalized plan was offered to patient via MyChart   Notes: Nothing significant to report at this time.

## 2023-12-01 NOTE — Patient Instructions (Addendum)
 Ms. Spagnuolo , Thank you for taking time out of your busy schedule to complete your Annual Wellness Visit with me. I enjoyed our conversation and look forward to speaking with you again next year. I, as well as your care team,  appreciate your ongoing commitment to your health goals. Please review the following plan we discussed and let me know if I can assist you in the future. Your Game plan/ To Do List   Referrals: If you haven't heard from the office you've been referred to, please reach out to them at the phone provided.   Bone Density:  if not done since 11/18/21. Please bring name of facility to your upcoming appt so we can request the record.  Mammogram:  please bring name of facility to upcoming appt so we can request the records  Follow up Visits: Next Medicare AWV with our clinical staff:  12/05/24 1pm, telephone  Next Office Visit with your provider: 12/05/23 1pm, Dr Frann  Clinician Recommendations:  Aim for 30 minutes of exercise or brisk walking, 6-8 glasses of water, and 5 servings of fruits and vegetables each day.    You will need to get the following vaccines at your local pharmacy:  Tetanus on or after 03/12/24     This is a list of the screening recommended for you and due dates:  Health Maintenance  Topic Date Due   Medicare Annual Wellness Visit  04/08/2023   Flu Shot  02/23/2026*   Complete foot exam   12/20/2023   DTaP/Tdap/Td vaccine (2 - Td or Tdap) 03/12/2024   Hemoglobin A1C  05/09/2024   Eye exam for diabetics  08/18/2024   Yearly kidney function blood test for diabetes  11/06/2024   Yearly kidney health urinalysis for diabetes  11/06/2024   Colon Cancer Screening  06/27/2029   DEXA scan (bone density measurement)  Completed   Hepatitis C Screening  Completed   HPV Vaccine  Aged Out   Meningitis B Vaccine  Aged Out   Pneumococcal Vaccine for age over 47  Discontinued   Hepatitis B Vaccine  Discontinued   Breast Cancer Screening  Discontinued   COVID-19  Vaccine  Discontinued   Zoster (Shingles) Vaccine  Discontinued  *Topic was postponed. The date shown is not the original due date.    Advanced directives: (Copy Requested) Please bring a copy of your health care power of attorney and living will to the office to be added to your chart at your convenience. You can mail to [facility] [address] [place], [place] [address] or email to [email or web address] Advance Care Planning is important because it:  [x]  Makes sure you receive the medical care that is consistent with your values, goals, and preferences  [x]  It provides guidance to your family and loved ones and reduces their decisional burden about whether or not they are making the right decisions based on your wishes.  Follow the link provided in your after visit summary or read over the paperwork we have mailed to you to help you started getting your Advance Directives in place. If you need assistance in completing these, please reach out to us  so that we can help you!  See attachments for Preventive Care and Fall Prevention Tips.

## 2023-12-05 ENCOUNTER — Ambulatory Visit (INDEPENDENT_AMBULATORY_CARE_PROVIDER_SITE_OTHER): Admitting: Family Medicine

## 2023-12-05 ENCOUNTER — Encounter: Payer: Self-pay | Admitting: Family Medicine

## 2023-12-05 VITALS — BP 130/80 | HR 54 | Resp 18 | Ht 66.0 in | Wt 196.0 lb

## 2023-12-05 DIAGNOSIS — J45991 Cough variant asthma: Secondary | ICD-10-CM

## 2023-12-05 DIAGNOSIS — M67952 Unspecified disorder of synovium and tendon, left thigh: Secondary | ICD-10-CM

## 2023-12-05 HISTORY — DX: Cough variant asthma: J45.991

## 2023-12-05 MED ORDER — FLUCONAZOLE 150 MG PO TABS: ORAL_TABLET | ORAL | 0 refills | Status: DC

## 2023-12-05 MED ORDER — EMPAGLIFLOZIN 25 MG PO TABS: 25.0000 mg | ORAL_TABLET | Freq: Every day | ORAL | Status: DC

## 2023-12-05 MED ORDER — QVAR REDIHALER 40 MCG/ACT IN AERB: INHALATION_SPRAY | RESPIRATORY_TRACT | 2 refills | Status: DC

## 2023-12-05 NOTE — Progress Notes (Signed)
 Chief Complaint  Patient presents with   Follow-up    Subjective: Patient is a 68 y.o. female here for f/u.  Patient was started on Airsupra  and reports significant improvement in her coughing when she takes this.  She also found mold in her HVAC system and had it replaced.  The frequency of her coughing episodes has decreased.  No chest pain, wheezing, shortness of breath.  She is not on a maintenance inhaler currently.  Over the past week and a half, she has had pain over her left gluteus muscle.  She has a history of chronic low back pain.  She is wondering if she needs therapy.  She saw chiropractor which was not helpful.  No bruising, redness, swelling.  Past Medical History:  Diagnosis Date   Allergy     ANXIETY 10/18/2007   Qualifier: Diagnosis of  By: Johnny MD, Garnette LABOR    Autoimmune hepatitis [facility])    Cataract    beginnings of   Chronic low back pain    GERD 10/18/2007   Qualifier: Diagnosis of  By: Delos, CMA, Cindy     Hyperlipemia 03/12/2014   HYPERTENSION 10/18/2007   Qualifier: Diagnosis of  By: Delos, CMA, Cindy     Insomnia 03/12/2014   Irritable bowel syndrome 10/18/2007   Qualifier: Diagnosis of  By: Johnny MD, Garnette LABOR    Osteopenia    Recurrent knee pain    R knee hx meniscal tear 2002 with repair   Type 2 diabetes mellitus with hyperglycemia (HCC)     Objective: BP 130/80   Pulse (!) 54   Resp 18   Ht 5' 6 (1.676 m)   Wt 196 lb (88.9 kg)   SpO2 98%   BMI 31.64 kg/m  General: Awake, appears stated age Heart: Bradycardic, regular rhythm, no LE edema, DP pulses 1+ bilaterally Neuro: Gait is normal, sensation intact to pinprick over bilateral feet MSK: TTP over the left gluteus medius, pain with resisted left hip abduction Lungs: CTAB, no rales, wheezes or rhonchi. 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  Continue Airsupra  as needed.  Add Qvar  for yellow zone treatment.  Rinse mouth out after usage. Heat, ice, Tylenol , stretches and exercises.  Consider physical therapy if no improvement. The patient voiced understanding and agreement to the plan.  Mabel Mt [place], DO 12/05/23  2:11 PM

## 2023-12-05 NOTE — Patient Instructions (Addendum)
 Asthma Action Plan There are 3 zones to consider: 1. Green Zone- This is the zone we hope to keep you in. Avoiding allergens and compliance with inhalers will keep you in this safe zone. No need to take anything extra while in this zone. 2. Yellow Zone- This zone is where we can save time, money and visits. You are not doing well enough to be considered in the green zone, but not bad enough to be in the red zone. This is where we need to remove any allergen or factor that is contributing to your breathing issues. You have a separate inhaler (inhaled steroid) to take twice daily in addition to your other inhaler. This should give you the push you need to get back to the green zone. Contact our office if you have any questions. 3. Red Zone- This is the zone where you should consider calling 911 vs going to the ER. Despite compliance with inhalers/meds (or maybe because we haven't been using them), our breathing isn't where we want it to be. Albuterol  isn't helping as much either and you need medical care. This is the zone we try to avoid as much as possible!  Heat (pad or rice pillow in microwave) over affected area, 10-15 minutes twice daily.   Ice/cold pack over area for 10-15 min twice daily.  OK to take Tylenol  1000 mg (2 extra strength tabs) or 975 mg (3 regular strength tabs) every 6 hours as needed.  Gluteus Medius Syndrome Rehab It is normal to feel mild stretching, pulling, tightness, or discomfort as you do these exercises, but you should stop right away if you feel sudden pain or your pain gets worse.   Stretching and range of motion exercise This exercise warms up your muscles and joints and improves the movement and flexibility of your hip and pelvis. This exercise also helps to relieve pain and stiffness. Exercise A: Lunge (hip flexor stretch)      Kneel on the floor on your left / right knee. Bend your other knee so it is directly over your ankle. Keep good posture with your head  over your shoulders. Tuck your tailbone underneath you. This will prevent your back from arching too much. You should feel a gentle stretch in the front of your thigh or hip. If you do not feel a stretch, slowly lunge forward with your chest up. Hold this position for 30 seconds. Slowly return to the starting position. Repeat 2 times. Complete this exercise 3 times per week. Strengthening exercises These exercises build strength and endurance in your hip and pelvis. Endurance is the ability to use your muscles for a long time, even after they get tired. Exercise B: Bridge (hip extensors)     Lie on your back on a firm surface with your knees bent and your feet flat on the floor. Tighten your buttocks muscles and lift your bottom off the floor until the trunk of your body is level with your thighs. You should feel the muscles working in your buttocks and the back of your thighs. If this exercise is too easy, cross your arms over your chest or lift one leg while your bottom is up off the floor. Do not arch your back. Hold this position for 3 seconds. Slowly lower your hips to the starting position. Let your muscles relax completely between repetitions. Repeat 2 times. Complete this exercise 3 times per week. Exercise C: Straight leg raises (hip abductors)     Lie on your side  with your left / right leg in the top position. Lie so your head, shoulder, knee, and hip line up. Bend your bottom knee to help you balance. Lift your top leg up 4-6 inches (10-15 cm), keeping your toes pointed straight ahead. Hold this position for 2 seconds. Slowly lower your leg to the starting position and let your muscles relax completely. Repeat for a total of 10 repetitions. Repeat 2 times. Complete this exercise 3 times per week. Exercise D: Hip abductors and external rotators, quadruped Get on your hands and knees on a firm, lightly padded surface. Your hands should be directly below your shoulders, and your  knees should be directly below your hips. Lift your left / right knee out to the side. Keep your knee bent. Do not twist your body. Hold this position for 3 seconds. Slowly lower your leg. Repeat for a total of 10 repetitions.  Repeat 2 times. Complete this exercise 3 times per week. Exercise E: Single leg stand Stand near a counter or door frame to hold onto as needed. It is helpful to look in a mirror for this exercise so you can watch your hip. Squeeze your left / right buttock muscles then lift up your other foot. Do not let your left / right hip push out to the side. Hold this position for 3 seconds. Repeat for a total of 10 repetitions. Repeat 2 times. Complete this exercise 3 times per week. Make sure you discuss any questions you have with your health care provider. Document Released: 03/01/2005 Document Revised: 11/06/2015 Document Reviewed: 02/11/2015 Elsevier Interactive Patient Education  [employer].

## 2023-12-06 ENCOUNTER — Other Ambulatory Visit: Payer: Self-pay | Admitting: Family Medicine

## 2023-12-06 ENCOUNTER — Encounter: Payer: Self-pay | Admitting: Family Medicine

## 2023-12-06 NOTE — Telephone Encounter (Signed)
 Requesting: Ambien  6.25mg   Contract: 04/04/23 UDS: 04/04/23 Last Visit: 12/05/23 Next Visit: 02/13/24 Last Refill: 05/25/23 #30 and 5RF   Please Advise

## 2023-12-08 ENCOUNTER — Other Ambulatory Visit: Payer: Self-pay

## 2023-12-08 DIAGNOSIS — R058 Other specified cough: Secondary | ICD-10-CM

## 2023-12-09 ENCOUNTER — Telehealth: Payer: Self-pay

## 2023-12-09 NOTE — Progress Notes (Signed)
 Complex Care Management Note Care Guide Note  12/09/2023 Name: Karen Hall MRN: [id number] DOB: 16-Sep-1955   Complex Care Management Outreach Attempts: An unsuccessful telephone outreach was attempted today to offer the patient information about available complex care management services.  Follow Up Plan:  Additional outreach attempts will be made to offer the patient complex care management information and services.   Encounter Outcome:  No Answer  Dreama [facility]  [facility], [facility] VBCI Assistant Direct Dial: [phone]  Fax: [phone]

## 2023-12-12 NOTE — Progress Notes (Unsigned)
 Complex Care Management Note Care Guide Note  12/12/2023 Name: Karen Hall MRN: [id number] DOB: 1956/01/09   Complex Care Management Outreach Attempts: A second unsuccessful outreach was attempted today to offer the patient with information about available complex care management services.  Follow Up Plan:  Additional outreach attempts will be made to offer the patient complex care management information and services.   Encounter Outcome:  No Answer  Dreama [facility]  [facility], [facility] VBCI Assistant Direct Dial: [phone]  Fax: [phone]

## 2023-12-13 NOTE — Progress Notes (Signed)
 Complex Care Management Note  Care Guide Note 12/13/2023 Name: Karen Hall MRN: [id number] DOB: 11/12/1955  [name] is a 68 y.o. year old female who sees Frann, Mabel Mt, DO for primary care. I reached out to Mikya A Goines by phone today to offer complex care management services.  [name] was given information about Complex Care Management services today including:   The Complex Care Management services include support from the care team which includes your Nurse Care Manager, Clinical Social Worker, or Pharmacist.  The Complex Care Management team is here to help remove barriers to the health concerns and goals most important to you. Complex Care Management services are voluntary, and the patient may decline or stop services at any time by request to their care team member.   Complex Care Management Consent Status: Patient did not agree to participate in complex care management services at this time.  Encounter Outcome:  Patient Refused  Dreama Agent [facility], [facility] VBCI Assistant Direct Dial: [phone]  Fax: [phone]

## 2023-12-14 NOTE — Progress Notes (Addendum)
 Hazeline A Byard                                          MRN: [id number]   12/14/2023   The VBCI Quality Team Specialist reviewed this patient medical record for the purposes of chart review for care gap closure. The following were reviewed: abstraction for care gap closure-kidney health evaluation for diabetes:eGFR  and uACR.  01/13/2024- GSD out of range.    VBCI Quality Team

## 2023-12-22 ENCOUNTER — Encounter: Payer: Self-pay | Admitting: Family Medicine

## 2023-12-25 ENCOUNTER — Encounter: Payer: Self-pay | Admitting: Family Medicine

## 2023-12-26 ENCOUNTER — Encounter: Payer: Self-pay | Admitting: Family Medicine

## 2023-12-26 ENCOUNTER — Telehealth: Payer: Self-pay

## 2023-12-26 ENCOUNTER — Other Ambulatory Visit: Payer: Self-pay | Admitting: Family Medicine

## 2023-12-26 MED ORDER — FLUCONAZOLE 150 MG PO TABS: ORAL_TABLET | ORAL | 0 refills | Status: DC

## 2023-12-26 NOTE — Telephone Encounter (Signed)
 Requesting: Ambien  6.25mg  Contract: 04/04/23 UDS: 04/04/23 Last Visit: 12/05/23 Next Visit: 02/13/24    Copied from CRM #[id number]. Topic: Clinical - Medication Question >> Dec 26, 2023  3:33 PM Franky GRADE wrote: Reason for CRM: Patient is calling to follow up on the message she sent earlier today for  Zolpidem  refill that [facility]'t fill because she isn't due until the 20th; however, patient is leaving on vacation on Friday. Called cal and was advised the message was received and awaiting a response.  Patient is nervous because she leaves Friday and does not want to be out of the medication.

## 2023-12-27 NOTE — Telephone Encounter (Signed)
 Sent pt message for appt tomorrow at 1145.

## 2023-12-27 NOTE — Telephone Encounter (Signed)
 Pt stated she would like see you today or tomorrow for an appointment to discuss anxiety and BP .SABRA You are booked , is there a day and time you are able to fit her in.. doesn't want to see anyone else

## 2023-12-27 NOTE — Telephone Encounter (Signed)
 Spoke with pharmacy -- approved early refill   Pt called and notified

## 2023-12-28 ENCOUNTER — Encounter: Payer: Self-pay | Admitting: Family Medicine

## 2023-12-28 ENCOUNTER — Ambulatory Visit: Admitting: Family Medicine

## 2023-12-28 VITALS — BP 148/88 | HR 77 | Temp 98.0°F | Resp 16 | Ht 66.0 in | Wt 197.6 lb

## 2023-12-28 DIAGNOSIS — F411 Generalized anxiety disorder: Secondary | ICD-10-CM | POA: Diagnosis not present

## 2023-12-28 DIAGNOSIS — I10 Essential (primary) hypertension: Secondary | ICD-10-CM | POA: Diagnosis not present

## 2023-12-28 DIAGNOSIS — G8929 Other chronic pain: Secondary | ICD-10-CM

## 2023-12-28 DIAGNOSIS — M25552 Pain in left hip: Secondary | ICD-10-CM | POA: Diagnosis not present

## 2023-12-28 NOTE — Progress Notes (Signed)
 Chief Complaint  Patient presents with   Hypertension    BP    Subjective Karen Hall is a 68 y.o. female who presents for hypertension follow up. She does monitor home blood pressures. Blood pressures ranging from 140-150's/90-100's on average. Started spiking after planning a trip to  She is compliant with medications- olmesartan  10 mg/d, Toprol  XL 25 mg/d. Patient has these side effects of medication: none She is adhering to a healthy diet overall. Current exercise: some walking No CP or SOB.    Past Medical History:  Diagnosis Date   Allergy     ANXIETY 10/18/2007   Qualifier: Diagnosis of  By: Johnny MD, Garnette LABOR    Autoimmune hepatitis [facility])    Cataract    beginnings of   Chronic low back pain    GERD 10/18/2007   Qualifier: Diagnosis of  By: Delos, CMA, Cindy     Hyperlipemia 03/12/2014   HYPERTENSION 10/18/2007   Qualifier: Diagnosis of  By: Delos, CMA, Cindy     Insomnia 03/12/2014   Irritable bowel syndrome 10/18/2007   Qualifier: Diagnosis of  By: Johnny MD, Garnette LABOR    Osteopenia    Recurrent knee pain    R knee hx meniscal tear 2002 with repair   Type 2 diabetes mellitus with hyperglycemia (HCC)     Exam BP (!) 148/88 (BP Location: Left Arm, Cuff Size: Normal)   Pulse 77   Temp 98 F (36.7 C) (Oral)   Resp 16   Ht 5' 6 (1.676 m)   Wt 197 lb 9.6 oz (89.6 kg)   SpO2 96%   BMI 31.89 kg/m  General:  well developed, well nourished, in no apparent distress Heart: RRR, no bruits, no LE edema Lungs: clear to auscultation, no accessory muscle use Psych: well oriented with normal range of affect and appropriate judgment/insight  Essential hypertension  Anxiety state  Chronic left hip pain  Chronic, uncontrolled. Likely 2/2 #2. Monitor BP at home. Cont olmesartan  10 mg/d. Toprol  XL 25 mg/d, will also tak Toprol  25 mg bid prn for anxiety. Counseled on diet and exercise. As above. Will refer to sports med when she gets back from [facility].  F/u as originally  scheduled. The patient voiced understanding and agreement to the plan.  Karen Mt [place], DO 12/28/23  12:16 PM

## 2023-12-28 NOTE — Patient Instructions (Signed)
 Keep the diet clean and stay active.  Take an extra metoprolol  tab every 12 hours as needed for anxiety.   Send me a message when you get back and we will refer you to the sports med team for your hip.  Heat (pad or rice pillow in microwave) over affected area, 10-15 minutes twice daily.   Ice/cold pack over area for 10-15 min twice daily.  OK to take Tylenol  1000 mg (2 extra strength tabs) or 975 mg (3 regular strength tabs) every 6 hours as needed.  Let us  know if you need anything.

## 2024-01-07 ENCOUNTER — Other Ambulatory Visit: Payer: Self-pay | Admitting: Family Medicine

## 2024-01-18 ENCOUNTER — Other Ambulatory Visit: Payer: Self-pay | Admitting: Family Medicine

## 2024-01-18 ENCOUNTER — Telehealth

## 2024-01-23 ENCOUNTER — Ambulatory Visit (HOSPITAL_BASED_OUTPATIENT_CLINIC_OR_DEPARTMENT_OTHER)
Admission: RE | Admit: 2024-01-23 | Discharge: 2024-01-23 | Disposition: A | Discharge status: Home / Self Care | Source: Ambulatory Visit | Attending: Family Medicine | Admitting: Family Medicine

## 2024-01-23 ENCOUNTER — Encounter: Payer: Self-pay | Admitting: Family Medicine

## 2024-01-23 ENCOUNTER — Ambulatory Visit: Admitting: Family Medicine

## 2024-01-23 ENCOUNTER — Encounter (HOSPITAL_BASED_OUTPATIENT_CLINIC_OR_DEPARTMENT_OTHER): Payer: Self-pay

## 2024-01-23 VITALS — BP 132/86 | HR 100 | Temp 98.0°F | Resp 16 | Ht 66.0 in | Wt 192.0 lb

## 2024-01-23 DIAGNOSIS — R000 Tachycardia, unspecified: Secondary | ICD-10-CM | POA: Insufficient documentation

## 2024-01-23 DIAGNOSIS — J9811 Atelectasis: Secondary | ICD-10-CM | POA: Diagnosis not present

## 2024-01-23 DIAGNOSIS — R0609 Other forms of dyspnea: Secondary | ICD-10-CM | POA: Insufficient documentation

## 2024-01-23 LAB — POCT I-STAT CREATININE: Creatinine, Ser: 1.1 mg/dL — ABNORMAL HIGH (ref 0.44–1.00)

## 2024-01-23 MED ORDER — IOHEXOL 350 MG/ML SOLN
100.0000 mL | Freq: Once | INTRAVENOUS | Status: AC | PRN
  Administered 2024-01-23: 75 mL via INTRAVENOUS

## 2024-01-23 NOTE — Progress Notes (Signed)
 Chief Complaint  Patient presents with   Hypertension    BP     Subjective: Patient is a 68 y.o. female here for DOE.  For the past 2-1/2 weeks, the patient has had shortness of breath with minimal physical exertion.  She has actually lost weight.  She is eating and drinking normally denies any nausea, vomiting, diarrhea, or bleeding.  She has not been sick.  No swelling, wheezing, or coughing.  She has no chest pain.  She does have some calf pain and traveled to [place]  prior to this happening.  She has no personal or family history of a clot.  Past Medical History:  Diagnosis Date   Allergy     ANXIETY 10/18/2007   Qualifier: Diagnosis of  By: Johnny MD, Garnette LABOR    Autoimmune hepatitis [facility])    Cataract    beginnings of   Chronic low back pain    GERD 10/18/2007   Qualifier: Diagnosis of  By: Delos, CMA, Cindy     Hyperlipemia 03/12/2014   HYPERTENSION 10/18/2007   Qualifier: Diagnosis of  By: Delos, CMA, Cindy     Insomnia 03/12/2014   Irritable bowel syndrome 10/18/2007   Qualifier: Diagnosis of  By: Johnny MD, Garnette LABOR    Osteopenia    Recurrent knee pain    R knee hx meniscal tear 2002 with repair   Type 2 diabetes mellitus with hyperglycemia (HCC)     Objective: BP 132/86 (BP Location: Left Arm, Patient Position: Sitting)   Pulse 100   Temp 98 F (36.7 C) (Oral)   Resp 16   Ht 5' 6 (1.676 m)   Wt 192 lb (87.1 kg)   SpO2 98%   BMI 30.99 kg/m  General: Awake, appears stated age Heart: Regular rhythm, tachycardic, no LE edema MSK: TTP over the medial left gastrocnemius Lungs: CTAB, no rales, wheezes or rhonchi. No accessory muscle use Psych: Age appropriate judgment and insight, normal affect and mood  Assessment and Plan: DOE (dyspnea on exertion) - Plan: CT Angio Chest Pulmonary Embolism (PE) W or WO Contrast, EKG 12-Lead  Tachycardia - Plan: CT Angio Chest Pulmonary Embolism (PE) W or WO Contrast, EKG 12-Lead  New problem with uncertain prognosis.  Given her  recent travel, left sided calf pain, and dyspnea exertion without a more obvious explanation, we will rule out a PE with the above testing.  EKG not suggestive of ischemia.  Stay hydrated in the meanwhile.  If the CTA is negative, would consider basic blood work and possibly cardiology evaluation. The patient voiced understanding and agreement to the plan.  [name], DO 01/23/24  4:22 PM

## 2024-01-24 ENCOUNTER — Other Ambulatory Visit (INDEPENDENT_AMBULATORY_CARE_PROVIDER_SITE_OTHER)

## 2024-01-24 ENCOUNTER — Encounter: Payer: Self-pay | Admitting: Family Medicine

## 2024-01-24 ENCOUNTER — Ambulatory Visit: Payer: Self-pay | Admitting: Family Medicine

## 2024-01-24 DIAGNOSIS — R0609 Other forms of dyspnea: Secondary | ICD-10-CM

## 2024-01-25 ENCOUNTER — Ambulatory Visit: Payer: Self-pay | Admitting: Family Medicine

## 2024-01-25 ENCOUNTER — Ambulatory Visit

## 2024-01-25 VITALS — BP 114/84 | HR 93 | Ht 66.0 in | Wt 195.0 lb

## 2024-01-25 DIAGNOSIS — I10 Essential (primary) hypertension: Secondary | ICD-10-CM

## 2024-01-25 DIAGNOSIS — R0609 Other forms of dyspnea: Secondary | ICD-10-CM

## 2024-01-25 LAB — COMPREHENSIVE METABOLIC PANEL WITH GFR
ALT: 63 U/L — ABNORMAL HIGH (ref 0–35)
AST: 66 U/L — ABNORMAL HIGH (ref 0–37)
Albumin: 4.4 g/dL (ref 3.5–5.2)
Alkaline Phosphatase: 208 U/L — ABNORMAL HIGH (ref 39–117)
BUN: 25 mg/dL — ABNORMAL HIGH (ref 6–23)
CO2: 26 meq/L (ref 19–32)
Calcium: 10.1 mg/dL (ref 8.4–10.5)
Chloride: 102 meq/L (ref 96–112)
Creatinine, Ser: 0.99 mg/dL (ref 0.40–1.20)
GFR: 57.79 mL/min — ABNORMAL LOW (ref >60.00)
Glucose, Bld: 120 mg/dL — ABNORMAL HIGH (ref 70–99)
Potassium: 3.8 meq/L (ref 3.5–5.1)
Sodium: 137 meq/L (ref 135–145)
Total Bilirubin: 0.6 mg/dL (ref 0.2–1.2)
Total Protein: 7.7 g/dL (ref 6.0–8.3)

## 2024-01-25 LAB — CBC
HCT: 43.5 % (ref 36.0–46.0)
Hemoglobin: 13.9 g/dL (ref 12.0–15.0)
MCHC: 32 g/dL (ref 30.0–36.0)
MCV: 86.8 fl (ref 78.0–100.0)
Platelets: 292 K/uL (ref 150.0–400.0)
RBC: 5.01 Mil/uL (ref 3.87–5.11)
RDW: 14.1 % (ref 11.5–15.5)
WBC: 7.9 K/uL (ref 4.0–10.5)

## 2024-01-25 LAB — MAGNESIUM: Magnesium: 1.8 mg/dL (ref 1.5–2.5)

## 2024-01-25 MED ORDER — METOPROLOL TARTRATE 100 MG PO TABS: 100.0000 mg | ORAL_TABLET | Freq: Once | ORAL | 0 refills | Status: DC

## 2024-01-25 MED ORDER — ASPIRIN 81 MG PO TBEC: 81.0000 mg | DELAYED_RELEASE_TABLET | Freq: Every day | ORAL | Status: DC

## 2024-01-25 NOTE — Progress Notes (Signed)
 Cardiology Consultation:    Date:  01/25/2024   ID:  Karen, Hall 1955-09-15, MRN [id number]  PCP:  Frann Mabel Mt, DO  Cardiologist:  Alean SAUNDERS Alim Cattell, MD   Referring MD: Frann Mabel Mt*   No chief complaint on file.    ASSESSMENT AND PLAN:   [name] 68 year old woman with history of hypertension, diabetes, anxiety, GERD, hyperlipidemia Prior echocardiogram from April 2021 reported mild LVH with grade 1 diastolic dysfunction EF 60 to 34%, normal RV function, trace MR, trace TR. No prior history of CAD, CHF, MI, CVA.  Now with progressive symptoms of dyspnea on exertion more noticeable over the past 2 to 3 weeks. Given recent travel PCP evaluated with CTA chest PE protocol that showed no major abnormalities.  Here for further evaluation of cardiac causes of her symptoms.  Problem List Items Addressed This Visit     Essential hypertension   Will call her. Continue current medications metoprolol  XL 25 mg once daily Continue olmesartan  10 mg once daily. Target blood pressure below 130/80 mmHg.       Relevant Medications   aspirin EC 81 MG tablet   metoprolol  tartrate (LOPRESSOR ) 100 MG tablet   Dyspnea on exertion - Primary   Significant dyspnea on exertion and progressive over the past year and a half adding worse over the last 2 to 3 weeks. No significant abnormality on prior pulmonary workup.  Has significant cardiovascular risk factors given her age, diabetes, hypertension. Symptoms suggestive of angina.  Will further evaluate for any significant obstructive coronary artery disease with cardiac CT coronary angiogram. Discussed the use of radiation and contrast for the imaging study.  She is agreeable to proceed.  Advised her to proceed to ER or call 911 right away if her symptoms are further worsening or not relieving with rest.  Will also obtain transthoracic echocardiogram to assess cardiac structure and function.  Advised her to  start taking aspirin 81 mg once daily.  No significant side effects with this in the past      Relevant Orders   EKG 12-Lead (Completed)   ECHOCARDIOGRAM COMPLETE   CT CORONARY MORPH W/CTA COR W/SCORE W/CA W/CM &/OR WO/CM   Return to clinic for follow-up tentatively in 6 weeks   History of Present Illness:    Karen Hall is a 68 y.o. female who is being seen today for the evaluation of dyspnea on exertion at the request of Frann Mabel Mt*.   Pleasant woman here for the visit by herself.  Lives by herself at home with her cat.  Her sister lives nearby.  Does not have biological children but has children and grandchildren from her marriage.  Retired.  Originally from [place]  but lived in various parts of the country including [place]  and [place] .  Has history of hypertension, diabetes, anxiety, GERD, hyperlipidemia Prior echocardiogram from April 2021 reported mild LVH with grade 1 diastolic dysfunction EF 60 to 34%, normal RV function, trace MR, trace TR. No prior history of CAD, CHF, MI, CVA.  Dyspnea on exertion symptoms over the last few weeks. PCP requested CT chest PE protocol 01/23/2024 showed no evidence of pulmonary embolism.  No acute aortic abnormalities.  Blood work from yesterday 01/24/2024 noted hemoglobin 13.9, hematocrit 43.5, platelets 292 and WBC 7.9. CMP sodium 137 potassium 3.8 BUN 25 and creatinine 0.99, eGFR 58. Mildly elevated transaminases and alkaline phosphatase.  [Chronic mild elevation].  Continue to follow-up with PCP.  Mentions her symptoms have been more  noticeable for the past 2 to 3 weeks.  Her trip to [place]  was mid October where she noticed getting out of breath walking in the airports. Functional status is somewhat limited due to osteoarthritis of the hip and knees. Denies any syncopal episodes or near syncopal episodes. No orthopnea, paroxysmal nocturnal dyspnea.  No pedal edema.  Describes sensation of shortness of breath with exertion  which has been progressing and more noticeable over the past 2 to 3 weeks.  She mentions her sister made an observation that she had been short of breath on exertion at least over the past year and a half.  Symptoms completely relieved with rest within a few minutes. She also has allergies which seem to have improved with recent medication changes by her PCP.  Does not smoke. Rare social alcohol consumption. No illicit drug use.  Good compliance with her medication. Currently not on aspirin.  EKG in the clinic today shows sinus rhythm with heart rate 93/min, PR interval 202 ms, QRS duration 80 ms left axis deviation.  No ischemic changes.  Anterolateral ST-T changes.  Past Medical History:  Diagnosis Date   Allergy     ANXIETY 10/18/2007   Qualifier: Diagnosis of  By: Johnny MD, Garnette LABOR    Autoimmune hepatitis [facility])    Cataract    beginnings of   Chronic low back pain    GERD 10/18/2007   Qualifier: Diagnosis of  By: Delos, CMA, Cindy     Hyperlipemia 03/12/2014   HYPERTENSION 10/18/2007   Qualifier: Diagnosis of  By: Delos, CMA, Cindy     Insomnia 03/12/2014   Irritable bowel syndrome 10/18/2007   Qualifier: Diagnosis of  By: Johnny MD, Garnette LABOR    Osteopenia    Recurrent knee pain    R knee hx meniscal tear 2002 with repair   Type 2 diabetes mellitus with hyperglycemia (HCC)     Past Surgical History:  Procedure Laterality Date   BREAST EXCISIONAL BIOPSY Left    Fibroid removed, 24 years ago    BREAST SURGERY     fibroid   CATARACT EXTRACTION Bilateral 09/2018   CHOLECYSTECTOMY     COLONOSCOPY     Around 2010   ESOPHAGOGASTRODUODENOSCOPY (EGD) WITH PROPOFOL  N/A 06/27/2019   Procedure: ESOPHAGOGASTRODUODENOSCOPY (EGD) WITH PROPOFOL ;  Surgeon: San Sandor GAILS, DO;  Location: [facility];  Service: Gastroenterology;  Laterality: N/A;   FOOT SURGERY Right    LIVER BIOPSY  02/2018   MALONEY DILATION  06/27/2019   Procedure: MALONEY DILATION;  Surgeon: San Sandor GAILS, DO;   Location: [facility];  Service: Gastroenterology;;   MENISCUS REPAIR Right    TONSILLECTOMY     TRANSORAL INCISIONLESS FUNDOPLICATION N/A 06/27/2019   Procedure: TRANSORAL INCISIONLESS FUNDOPLICATION;  Surgeon: San Sandor GAILS, DO;  Location: [facility];  Service: Gastroenterology;  Laterality: N/A;    Current Medications: Current Meds  Medication Sig   Albuterol -Budesonide (AIRSUPRA ) 90-80 MCG/ACT AERO Inhale 1-2 puffs into the lungs every 6 (six) hours as needed (Cough). Rinse mouth out after use.   aspirin EC 81 MG tablet Take 1 tablet (81 mg total) by mouth daily. Swallow whole.   atorvastatin  (LIPITOR) 40 MG tablet TAKE 1 TABLET BY MOUTH EVERY DAY   Cholecalciferol (D 5000) 125 MCG (5000 UT) capsule Take 5,000 Units by mouth daily.   citalopram  (CELEXA ) 20 MG tablet Take 1 tablet (20 mg total) by mouth daily.   diphenhydramine -acetaminophen  (TYLENOL  PM EXTRA STRENGTH) 25-500 MG TABS tablet Take 1 tablet by mouth  as directed.   fluconazole  (DIFLUCAN ) 150 MG tablet Take 1 tab, repeat in 72 hours if no improvement.   Homeopathic Products (ZICAM ALLERGY  RELIEF NA) Place 1 application into the nose 2 (two) times daily as needed (congestion/allergies).    JARDIANCE  25 MG TABS tablet Take 25 mg by mouth every morning.   Melatonin 5 MG CAPS Take 20 mg by mouth.   Menthol, Topical Analgesic, (BIOFREEZE EX) Apply 1 application topically at bedtime as needed (pain).   metFORMIN  (GLUCOPHAGE -XR) 500 MG 24 hr tablet TAKE 1 TABLET BY MOUTH EVERY DAY WITH BREAKFAST   metoprolol  succinate (TOPROL -XL) 25 MG 24 hr tablet Take 1 tablet (25 mg total) by mouth daily. Take with or immediately following a meal   metoprolol  tartrate (LOPRESSOR ) 100 MG tablet Take 1 tablet (100 mg total) by mouth once for 1 dose. Take 2 hours prior to your CT if your heart rate is greater than 55   olmesartan  (BENICAR ) 20 MG tablet Take 0.5 tablets (10 mg total) by mouth daily.   omeprazole  (PRILOSEC) 20 MG capsule Take 1  capsule (20 mg total) by mouth daily. Please call [phone] to schedule an office visit for more refills.   Polyethyl Glycol-Propyl Glycol (SYSTANE) 0.4-0.3 % SOLN Place 1 drop into both eyes daily as needed (dry eyes).   Simethicone  (GAS-X EXTRA STRENGTH) 62.5 MG STRP Take by mouth as directed.   zolpidem  (AMBIEN  CR) 6.25 MG CR tablet TAKE 1 TABLET (6.25 MG TOTAL) BY MOUTH AT BEDTIME AS NEEDED. FOR SLEEP     Allergies:   Aspartame and phenylalanine and Codeine   Social History   Socioeconomic History   Marital status: Widowed    Spouse name: Not on file   Number of children: 0   Years of education: Not on file   Highest education level: 12th grade  Occupational History   Not on file  Tobacco Use   Smoking status: Never   Smokeless tobacco: Never  Vaping Use   Vaping status: Never Used  Substance and Sexual Activity   Alcohol use: Not Currently   Drug use: No   Sexual activity: Not on file  Other Topics Concern   Not on file  Social History Narrative   Work or School: airline pilot - [employer]       Home Situation: lives alone      Spiritual Beliefs: none      Lifestyle: no regular exercise; poor diet      [facility]:   Originally from [place]. She has lived in [place], [place] , [place] , & [place] . She has a dog currently. No bird exposure. No hot tub exposure. No mold exposure. Currently works as a theatre manager.       Social Drivers of Corporate Investment Banker Strain: Low Risk  (12/27/2023)   Overall Financial Resource Strain (CARDIA)    Difficulty of Paying Living Expenses: Not hard at all  Food Insecurity: No Food Insecurity (12/27/2023)   Hunger Vital Sign    Worried About Running Out of Food in the Last Year: Never true    Ran Out of Food in the Last Year: Never true  Transportation Needs: No Transportation Needs (12/27/2023)   PRAPARE - Administrator, Civil Service (Medical): No    Lack of Transportation (Non-Medical): No   Physical Activity: Inactive (12/27/2023)   Exercise Vital Sign    Days of Exercise per Week: 0 days    Minutes of Exercise per Session: Not  on file  Stress: Stress Concern Present (12/27/2023)   [employer] of Occupational Health - Occupational Stress Questionnaire    Feeling of Stress: Very much  Social Connections: Socially Isolated (12/27/2023)   Social Connection and Isolation Panel    Frequency of Communication with Friends and Family: Twice a week    Frequency of Social Gatherings with Friends and Family: Once a week    Attends Religious Services: Patient declined    Database Administrator or Organizations: No    Attends Engineer, Structural: Not on file    Marital Status: Widowed     Family History: The patient's family history includes Breast cancer in her mother; Cancer in her maternal grandfather and mother; Diabetes in her maternal aunt and sister; Heart disease in her paternal grandfather; Hyperlipidemia in her mother; Liver cancer in her father. There is no history of Lung disease, Colon cancer, Esophageal cancer, Rectal cancer, or Stomach cancer. ROS:   Please see the history of present illness.    All 14 point review of systems negative except as described per history of present illness.  EKGs/Labs/Other Studies Reviewed:    The following studies were reviewed today:   EKG:  EKG Interpretation Date/Time:  Wednesday January 25 2024 14:54:00 EST Ventricular Rate:  93 PR Interval:  202 QRS Duration:  80 QT Interval:  382 QTC Calculation: 474 R Axis:   -70  Text Interpretation: Normal sinus rhythm Left axis deviation Low voltage QRS Possible Anterolateral infarct , age undetermined Abnormal ECG When compared with ECG of 16-Jun-2019 16:06, PREVIOUS ECG IS PRESENT Confirmed by Liborio Hai reddy [id number]) on 01/25/2024 3:01:54 PM    Recent Labs: 01/24/2024: ALT 63; BUN 25; Creatinine, Ser 0.99; Hemoglobin 13.9; Magnesium 1.8; Platelets 292.0;  Potassium 3.8; Sodium 137  Recent Lipid Panel    Component Value Date/Time   CHOL 160 11/07/2023 1003   TRIG 285.0 (H) 11/07/2023 1003   HDL 41.40 11/07/2023 1003   CHOLHDL 4 11/07/2023 1003   VLDL 57.0 (H) 11/07/2023 1003   LDLCALC 62 11/07/2023 1003   LDLDIRECT 77.0 04/09/2022 0932    Physical Exam:    VS:  BP 114/84   Pulse 93   Ht 5' 6 (1.676 m)   Wt 195 lb (88.5 kg)   SpO2 96%   BMI 31.47 kg/m     Wt Readings from Last 3 Encounters:  01/25/24 195 lb (88.5 kg)  01/23/24 192 lb (87.1 kg)  12/28/23 197 lb 9.6 oz (89.6 kg)     GENERAL:  Well nourished, well developed in no acute distress NECK: No JVD; No carotid bruits CARDIAC: RRR, S1 and S2 present, no murmurs, no rubs, no gallops CHEST:  Clear to auscultation without rales, wheezing or rhonchi  Extremities: No pitting pedal edema. Pulses bilaterally symmetric with radial 2+ and dorsalis pedis 2+ NEUROLOGIC:  Alert and oriented x 3  Medication Adjustments/Labs and Tests Ordered: Current medicines are reviewed at length with the patient today.  Concerns regarding medicines are outlined above.  Orders Placed This Encounter  Procedures   CT CORONARY MORPH W/CTA COR W/SCORE W/CA W/CM &/OR WO/CM   EKG 12-Lead   ECHOCARDIOGRAM COMPLETE   Meds ordered this encounter  Medications   aspirin EC 81 MG tablet    Sig: Take 1 tablet (81 mg total) by mouth daily. Swallow whole.   metoprolol  tartrate (LOPRESSOR ) 100 MG tablet    Sig: Take 1 tablet (100 mg total) by mouth once for 1 dose. Take  2 hours prior to your CT if your heart rate is greater than 55    Dispense:  1 tablet    Refill:  0    Signed, Ikhlas Albo reddy Patrici Minnis, MD, MPH, [facility]. 01/25/2024 3:25 PM    [facility]

## 2024-01-25 NOTE — Patient Instructions (Addendum)
 Medication Instructions:  Your physician recommends that you continue on your current medications as directed. Please refer to the Current Medication list given to you today.  *If you need a refill on your cardiac medications before your next appointment, please call your pharmacy*   Lab Work: None ordered If you have labs (blood work) drawn today and your tests are completely normal, you will receive your results only by: MyChart Message (if you have MyChart) OR A paper copy in the mail If you have any lab test that is abnormal or we need to change your treatment, we will call you to review the results.  Testing/Procedures: Your physician has requested that you have an echocardiogram. Echocardiography is a painless test that uses sound waves to create images of your heart. It provides your doctor with information about the size and shape of your heart and how well your heart's chambers and valves are working. This procedure takes approximately one hour. There are no restrictions for this procedure. Please do NOT wear cologne, perfume, aftershave, or lotions (deodorant is allowed). Please arrive 15 minutes prior to your appointment time.  Please note: We ask at that you not bring children with you during ultrasound (echo/ vascular) testing. Due to room size and safety concerns, children are not allowed in the ultrasound rooms during exams. Our front office staff cannot provide observation of children in our lobby area while testing is being conducted. An adult accompanying a patient to their appointment will only be allowed in the ultrasound room at the discretion of the ultrasound technician under special circumstances. We apologize for any inconvenience.   Your cardiac CT will be scheduled at one of the below locations:   [facility] [address]  [place], [place] [address]  If scheduled at the Heart and V[facility] at [address], please enter the  parking lot using the [address] entrance and use the FREE valet service at the patient drop-off area. Enter the building and check-in with registration on the main floor.   Please follow these instructions carefully (unless otherwise directed):  An IV will be required for this test and Nitroglycerin will be given.   On the Night Before the Test: Be sure to Drink plenty of water. Do not consume any caffeinated/decaffeinated beverages or chocolate 12 hours prior to your test. Do not take any antihistamines 12 hours prior to your test.  On the Day of the Test: Drink plenty of water until 1 hour prior to the test. Do not eat any food 1 hour prior to test. You may take your regular medications prior to the test.  Take metoprolol  (Lopressor ) 100 mg two hours prior to test. This is a 1 time dose. FEMALES- please wear underwire-free bra if available, avoid dresses & tight clothing      After the Test: Drink plenty of water. After receiving IV contrast, you may experience a mild flushed feeling. This is normal. On occasion, you may experience a mild rash up to 24 hours after the test. This is not dangerous. If this occurs, you can take Benadryl  25 mg, Zyrtec, Claritin, or Allegra and increase your fluid intake. (Patients taking Tikosyn should avoid Benadryl , and may take Zyrtec, Claritin, or Allegra) If you experience trouble breathing, this can be serious. If it is severe call 911 IMMEDIATELY. If it is mild, please call our office.  We will call to schedule your test 2-4 weeks out understanding that some insurance companies will need  an authorization prior to the service being performed.   For more information and frequently asked questions, please visit our website : [email or web address]  For non-scheduling related questions, please contact the cardiac imaging nurse navigator should you have any questions/concerns: Cardiac Imaging Nurse Navigators Direct Office Dial:  [phone]   For scheduling needs, including cancellations and rescheduling, please call Brittany, [phone].  Follow-Up: At [facility], you and your health needs are our priority.  As part of our continuing mission to provide you with exceptional heart care, we have created designated Provider Care Teams.  These Care Teams include your primary Cardiologist (physician) and Advanced Practice Providers (APPs -  Physician Assistants and Nurse Practitioners) who all work together to provide you with the care you need, when you need it.  We recommend signing up for the patient portal called MyChart.  Sign up information is provided on this After Visit Summary.  MyChart is used to connect with patients for Virtual Visits (Telemedicine).  Patients are able to view lab/test results, encounter notes, upcoming appointments, etc.  Non-urgent messages can be sent to your provider as well.   To learn more about what you can do with MyChart, go to [email or web address].    Your next appointment:   6 week(s)  The format for your next appointment:   In Person  Provider:   Alean Madireddy, MD   Other Instructions Echocardiogram An echocardiogram is a test that uses sound waves (ultrasound) to produce images of the heart. Images from an echocardiogram can provide important information about: Heart size and shape. The size and thickness and movement of your heart's walls. Heart muscle function and strength. Heart valve function or if you have stenosis. Stenosis is when the heart valves are too narrow. If blood is flowing backward through the heart valves (regurgitation). A tumor or infectious growth around the heart valves. Areas of heart muscle that are not working well because of poor blood flow or injury from a heart attack. Aneurysm detection. An aneurysm is a weak or damaged part of an artery wall. The wall bulges out from the normal force of blood pumping through the body. Tell a  health care provider about: Any allergies you have. All medicines you are taking, including vitamins, herbs, eye drops, creams, and over-the-counter medicines. Any blood disorders you have. Any surgeries you have had. Any medical conditions you have. Whether you are pregnant or may be pregnant. What are the risks? Generally, this is a safe test. However, problems may occur, including an allergic reaction to dye (contrast) that may be used during the test. What happens before the test? No specific preparation is needed. You may eat and drink normally. What happens during the test? You will take off your clothes from the waist up and put on a hospital gown. Electrodes or electrocardiogram (ECG)patches may be placed on your chest. The electrodes or patches are then connected to a device that monitors your heart rate and rhythm. You will lie down on a table for an ultrasound exam. A gel will be applied to your chest to help sound waves pass through your skin. A handheld device, called a transducer, will be pressed against your chest and moved over your heart. The transducer produces sound waves that travel to your heart and bounce back (or echo back) to the transducer. These sound waves will be captured in real-time and changed into images of your heart that can be viewed on a video monitor. The images will be recorded on a  computer and reviewed by your health care provider. You may be asked to change positions or hold your breath for a short time. This makes it easier to get different views or better views of your heart. In some cases, you may receive contrast through an IV in one of your veins. This can improve the quality of the pictures from your heart. The procedure may vary among health care providers and hospitals.   What can I expect after the test? You may return to your normal, everyday life, including diet, activities, and medicines, unless your health care provider tells you not to do  that. Follow these instructions at home: It is up to you to get the results of your test. Ask your health care provider, or the department that is doing the test, when your results will be ready. Keep all follow-up visits. This is important. Summary An echocardiogram is a test that uses sound waves (ultrasound) to produce images of the heart. Images from an echocardiogram can provide important information about the size and shape of your heart, heart muscle function, heart valve function, and other possible heart problems. You do not need to do anything to prepare before this test. You may eat and drink normally. After the echocardiogram is completed, you may return to your normal, everyday life, unless your health care provider tells you not to do that. This information is not intended to replace advice given to you by your health care provider. Make sure you discuss any questions you have with your health care provider. Document Revised: 10/23/2019 Document Reviewed: 10/23/2019 Elsevier Patient Education  2021 Elsevier Inc.   Important Information About Sugar

## 2024-01-25 NOTE — Assessment & Plan Note (Signed)
 Will call her. Continue current medications metoprolol  XL 25 mg once daily Continue olmesartan  10 mg once daily. Target blood pressure below 130/80 mmHg.

## 2024-01-25 NOTE — Assessment & Plan Note (Signed)
 Significant dyspnea on exertion and progressive over the past year and a half adding worse over the last 2 to 3 weeks. No significant abnormality on prior pulmonary workup.  Has significant cardiovascular risk factors given her age, diabetes, hypertension. Symptoms suggestive of angina.  Will further evaluate for any significant obstructive coronary artery disease with cardiac CT coronary angiogram. Discussed the use of radiation and contrast for the imaging study.  She is agreeable to proceed.  Advised her to proceed to ER or call 911 right away if her symptoms are further worsening or not relieving with rest.  Will also obtain transthoracic echocardiogram to assess cardiac structure and function.  Advised her to start taking aspirin 81 mg once daily.  No significant side effects with this in the past

## 2024-01-26 ENCOUNTER — Ambulatory Visit

## 2024-01-26 ENCOUNTER — Ambulatory Visit: Payer: Self-pay

## 2024-01-26 DIAGNOSIS — R0609 Other forms of dyspnea: Secondary | ICD-10-CM | POA: Diagnosis not present

## 2024-01-26 LAB — ECHOCARDIOGRAM COMPLETE
AR max vel: 2.45 cm2
AV Area VTI: 2.31 cm2
AV Area mean vel: 2.26 cm2
AV Mean grad: 2 mmHg
AV Peak grad: 3.5 mmHg
Ao pk vel: 0.94 m/s
Area-P 1/2: 7.37 cm2
Calc EF: 47.3 %
MV VTI: 1.51 cm2
S' Lateral: 2.8 cm
Single Plane A2C EF: 51.8 %
Single Plane A4C EF: 46.8 %

## 2024-01-31 NOTE — Telephone Encounter (Signed)
 Patient has received and read notes from Dr. Liborio discussing her test results. Results were routed to PCP.   Alan, RN

## 2024-02-06 ENCOUNTER — Encounter (HOSPITAL_COMMUNITY): Payer: Self-pay

## 2024-02-08 ENCOUNTER — Ambulatory Visit (HOSPITAL_COMMUNITY)
Admission: RE | Admit: 2024-02-08 | Discharge: 2024-02-08 | Disposition: A | Discharge status: Home / Self Care | Source: Ambulatory Visit | Attending: Cardiology | Admitting: Cardiology

## 2024-02-08 DIAGNOSIS — I2510 Atherosclerotic heart disease of native coronary artery without angina pectoris: Secondary | ICD-10-CM | POA: Diagnosis not present

## 2024-02-08 DIAGNOSIS — R918 Other nonspecific abnormal finding of lung field: Secondary | ICD-10-CM | POA: Insufficient documentation

## 2024-02-08 DIAGNOSIS — R0609 Other forms of dyspnea: Secondary | ICD-10-CM | POA: Diagnosis not present

## 2024-02-08 MED ORDER — METOPROLOL TARTRATE 5 MG/5ML IV SOLN
10.0000 mg | Freq: Once | INTRAVENOUS | Status: DC | PRN
Start: 2024-02-08 — End: 2024-02-09

## 2024-02-08 MED ORDER — IOHEXOL 350 MG/ML SOLN
100.0000 mL | Freq: Once | INTRAVENOUS | Status: AC | PRN
  Administered 2024-02-08: 100 mL via INTRAVENOUS

## 2024-02-08 MED ORDER — NITROGLYCERIN 0.4 MG SL SUBL
0.8000 mg | SUBLINGUAL_TABLET | Freq: Once | SUBLINGUAL | Status: AC
  Administered 2024-02-08: 0.8 mg via SUBLINGUAL

## 2024-02-08 MED ORDER — DILTIAZEM HCL 25 MG/5ML IV SOLN: 10.0000 mg | INTRAVENOUS | Status: DC | PRN

## 2024-02-13 ENCOUNTER — Encounter: Payer: Self-pay | Admitting: Family Medicine

## 2024-02-13 ENCOUNTER — Ambulatory Visit

## 2024-02-13 ENCOUNTER — Ambulatory Visit: Admitting: Family Medicine

## 2024-02-13 ENCOUNTER — Ambulatory Visit: Payer: Self-pay | Admitting: Family Medicine

## 2024-02-13 VITALS — BP 132/74 | HR 91 | Temp 98.0°F | Resp 16 | Ht 70.0 in | Wt 194.0 lb

## 2024-02-13 DIAGNOSIS — R7401 Elevation of levels of liver transaminase levels: Secondary | ICD-10-CM

## 2024-02-13 DIAGNOSIS — E1165 Type 2 diabetes mellitus with hyperglycemia: Secondary | ICD-10-CM

## 2024-02-13 DIAGNOSIS — M25552 Pain in left hip: Secondary | ICD-10-CM | POA: Diagnosis not present

## 2024-02-13 DIAGNOSIS — Z7984 Long term (current) use of oral hypoglycemic drugs: Secondary | ICD-10-CM

## 2024-02-13 DIAGNOSIS — M79605 Pain in left leg: Secondary | ICD-10-CM

## 2024-02-13 DIAGNOSIS — M79604 Pain in right leg: Secondary | ICD-10-CM | POA: Diagnosis not present

## 2024-02-13 DIAGNOSIS — M25551 Pain in right hip: Secondary | ICD-10-CM

## 2024-02-13 LAB — COMPREHENSIVE METABOLIC PANEL WITH GFR
ALT: 63 U/L — ABNORMAL HIGH (ref 0–35)
AST: 58 U/L — ABNORMAL HIGH (ref 0–37)
Albumin: 4.2 g/dL (ref 3.5–5.2)
Alkaline Phosphatase: 256 U/L — ABNORMAL HIGH (ref 39–117)
BUN: 17 mg/dL (ref 6–23)
CO2: 26 meq/L (ref 19–32)
Calcium: 10.6 mg/dL — ABNORMAL HIGH (ref 8.4–10.5)
Chloride: 104 meq/L (ref 96–112)
Creatinine, Ser: 1.02 mg/dL (ref 0.40–1.20)
GFR: 55.74 mL/min — ABNORMAL LOW (ref >60.00)
Glucose, Bld: 141 mg/dL — ABNORMAL HIGH (ref 70–99)
Potassium: 4.4 meq/L (ref 3.5–5.1)
Sodium: 137 meq/L (ref 135–145)
Total Bilirubin: 0.5 mg/dL (ref 0.2–1.2)
Total Protein: 7.5 g/dL (ref 6.0–8.3)

## 2024-02-13 LAB — LIPID PANEL
Cholesterol: 132 mg/dL (ref 0–200)
HDL: 36.3 mg/dL — ABNORMAL LOW (ref >39.00)
LDL Cholesterol: 57 mg/dL (ref 0–99)
NonHDL: 95.45
Total CHOL/HDL Ratio: 4
Triglycerides: 193 mg/dL — ABNORMAL HIGH (ref 0.0–149.0)
VLDL: 38.6 mg/dL (ref 0.0–40.0)

## 2024-02-13 LAB — HEMOGLOBIN A1C: Hgb A1c MFr Bld: 6.9 % — ABNORMAL HIGH (ref 4.6–6.5)

## 2024-02-13 MED ORDER — DULOXETINE HCL 20 MG PO CPEP: 20.0000 mg | ORAL_CAPSULE | Freq: Every day | ORAL | 3 refills | Status: DC

## 2024-02-13 NOTE — Progress Notes (Signed)
 Chief Complaint  Patient presents with   Follow-up    Follow Up    Subjective: Patient is a 68 y.o. female here for f/u.  Patient has been dealing with dyspnea.  She had a workup through cardiology that was unrevealing.  She had a CTA chest that did not show any lung pathologies or clot.  She is now wondering whether she is getting exhausted from her chronic bilateral hip and leg pain.  Light touch will cause it to flare for her.  It started with her left hip around 1 year ago.  She was given stretches and exercises for her IT band which were not particularly helpful.  No injury or change activity.  Denies bruising, redness, or swelling.  She does not have shortness of breath at rest.  Diet has been much better.  She has not been exercising due to the lower extremity pain as noted above.  She thinks her sugars will be quite a bit better.  A1c was 9.3 in August.  She is compliant with metformin  XR 500 mg daily, Jardiance  25 mg daily.  Past Medical History:  Diagnosis Date   Allergy     ANXIETY 10/18/2007   Qualifier: Diagnosis of  By: Johnny MD, Garnette LABOR    Autoimmune hepatitis [facility])    Cataract    beginnings of   Chronic low back pain    GERD 10/18/2007   Qualifier: Diagnosis of  By: Delos, CMA, Cindy     Hyperlipemia 03/12/2014   HYPERTENSION 10/18/2007   Qualifier: Diagnosis of  By: Delos, CMA, Cindy     Insomnia 03/12/2014   Irritable bowel syndrome 10/18/2007   Qualifier: Diagnosis of  By: Johnny MD, Garnette LABOR    Osteopenia    Recurrent knee pain    R knee hx meniscal tear 2002 with repair   Type 2 diabetes mellitus with hyperglycemia (HCC)     Objective: BP 132/74 (BP Location: Left Arm, Patient Position: Sitting)   Pulse 91   Temp 98 F (36.7 C) (Oral)   Resp 16   Ht 5' 10 (1.778 m)   Wt 194 lb (88 kg)   SpO2 98%   BMI 27.84 kg/m  General: Awake, appears stated age Heart: RRR, no LE edema Lungs: CTAB, no rales, wheezes or rhonchi. No accessory muscle use MSK: No back  TTP over paraspinal musculature or midline.  No SI joint TTP.  TTP over the entirety of her lower extremities.  Flexibility is limited secondary to pain.   Neuro: Difficult to ascertain strength secondary to pain.  Negative straight leg bilaterally.  DTRs equal and symmetric throughout. Psych: Age appropriate judgment and insight, normal affect and mood  Assessment and Plan: Bilateral hip pain - Plan: DG Hip Unilat W OR W/O Pelvis 2-3 Views Left, DG Hip Unilat W OR W/O Pelvis 2-3 Views Right, DULoxetine (CYMBALTA) 20 MG capsule, Ambulatory referral to Sports Medicine  Bilateral leg pain - Plan: DULoxetine (CYMBALTA) 20 MG capsule, Ambulatory referral to Sports Medicine  Type 2 diabetes mellitus with hyperglycemia, without long-term current use of insulin (HCC) - Plan: Comprehensive metabolic panel with GFR, Lipid panel, Hemoglobin A1c  1/2.  Check an x-ray to rule out osteoarthritis versus mass.  Chronic, not controlled.  Start Cymbalta 20 mg daily for hyperalgesia/allodynia.  Follow-up in 1 month for this.  Refer to sports medicine in the meanwhile. 3.  Chronic, hopefully stable.  Continue metformin  XR 500 mg daily, Jardiance  25 mg daily.  Check above labs.  Follow-up pending results. The patient voiced understanding and agreement to the plan.  Mabel Mt [place], DO 02/13/24  12:01 PM

## 2024-02-13 NOTE — Patient Instructions (Addendum)
 Heat (pad or rice pillow in microwave) over affected area, 10-15 minutes twice daily.   Ice/cold pack over area for 10-15 min twice daily.  OK to take Tylenol  1000 mg (2 extra strength tabs) or 975 mg (3 regular strength tabs) every 6 hours as needed.  Please get your X-ray done at the [facility] [place]: [address], [place], [place] [address] [phone]  You do not need an appointment for this location.   If you do not hear anything about your referral in the next 1-2 weeks, call our office and ask for an update.  Let us  know if you need anything.

## 2024-02-17 NOTE — Telephone Encounter (Signed)
 Patient is returning call.

## 2024-02-27 NOTE — Progress Notes (Unsigned)
 [facility] [address] [place] [address] Phone: [phone] Subjective:   Karen Hall Karen Hall, am serving as a scribe for Dr. Arthea Hall.  I'm seeing this patient by the request  of:  Karen Mabel Mt, DO  CC: Bilateral hip and leg pain  YEP:Dlagzrupcz  Karen Hall is a 68 y.o. female coming in with complaint of B hip and leg pain seen primary care provider.  Started on duloxetine  recently for the leg pain. Patient had to walk a lot in airport in October and her legs were very sore and weak throughout upper and lower legs. States that legs are sensitive to touch all over. States that legs feel very tight when she tries to walk and very heavy. Went to craft fair at [employer] and the next day she felt like she ran a marathon.  L leg pain worse than R. Pain over B GT. Sitting also increases her pain.   SOB since October but her sister said that she has had symptoms for past year.    X-rays were ordered of the hips bilaterally by primary care at the beginning of December.  These were independently visualized by me showing no significant bony abnormality.  Most recent labs did show elevation in liver enzymes as well as elevation in calcium .  Ultrasound of patient's abdomen in 2024 did show fatty liver.  Past Medical History:  Diagnosis Date   Allergy     ANXIETY 10/18/2007   Qualifier: Diagnosis of  By: Johnny MD, Garnette LABOR    Autoimmune hepatitis [facility])    Cataract    beginnings of   Chronic low back pain    GERD 10/18/2007   Qualifier: Diagnosis of  By: Delos, CMA, Cindy     Hyperlipemia 03/12/2014   HYPERTENSION 10/18/2007   Qualifier: Diagnosis of  By: Delos, CMA, Cindy     Insomnia 03/12/2014   Irritable bowel syndrome 10/18/2007   Qualifier: Diagnosis of  By: Johnny MD, Garnette LABOR    Osteopenia    Recurrent knee pain    R knee hx meniscal tear 2002 with repair   Type 2 diabetes mellitus with hyperglycemia (HCC)    Past Surgical  History:  Procedure Laterality Date   BREAST EXCISIONAL BIOPSY Left    Fibroid removed, 24 years ago    BREAST SURGERY     fibroid   CATARACT EXTRACTION Bilateral 09/2018   CHOLECYSTECTOMY     COLONOSCOPY     Around 2010   ESOPHAGOGASTRODUODENOSCOPY (EGD) WITH PROPOFOL  N/A 06/27/2019   Procedure: ESOPHAGOGASTRODUODENOSCOPY (EGD) WITH PROPOFOL ;  Surgeon: San Sandor GAILS, DO;  Location: [facility];  Service: Gastroenterology;  Laterality: N/A;   FOOT SURGERY Right    LIVER BIOPSY  02/2018   MALONEY DILATION  06/27/2019   Procedure: MALONEY DILATION;  Surgeon: San Sandor GAILS, DO;  Location: [facility];  Service: Gastroenterology;;   MENISCUS REPAIR Right    TONSILLECTOMY     TRANSORAL INCISIONLESS FUNDOPLICATION N/A 06/27/2019   Procedure: TRANSORAL INCISIONLESS FUNDOPLICATION;  Surgeon: San Sandor GAILS, DO;  Location: [facility];  Service: Gastroenterology;  Laterality: N/A;   Social History   Socioeconomic History   Marital status: Widowed    Spouse name: Not on file   Number of children: 0   Years of education: Not on file   Highest education level: 12th grade  Occupational History   Not on file  Tobacco Use   Smoking status: Never   Smokeless tobacco: Never  Vaping Use   Vaping status: Never Used  Substance and Sexual Activity   Alcohol use: Not Currently   Drug use: No   Sexual activity: Not on file  Other Topics Concern   Not on file  Social History Narrative   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 She has lived in [place], [place] , [place] , & [place] . She has a dog currently. No bird exposure. No hot tub exposure. No mold exposure. Currently works as a theatre manager.       Social Drivers of Health   Tobacco Use: Low Risk (02/13/2024)   Patient History    Smoking Tobacco  Use: Never    Smokeless Tobacco Use: Never    Passive Exposure: Not on file  Financial Resource Strain: Low Risk (12/27/2023)   Overall Financial Resource Strain (CARDIA)    Difficulty of Paying Living Expenses: Not hard at all  Food Insecurity: No Food Insecurity (12/27/2023)   Epic    Worried About Programme Researcher, Broadcasting/film/video in the Last Year: Never true    Ran Out of Food in the Last Year: Never true  Transportation Needs: No Transportation Needs (12/27/2023)   Epic    Lack of Transportation (Medical): No    Lack of Transportation (Non-Medical): No  Physical Activity: Inactive (12/27/2023)   Exercise Vital Sign    Days of Exercise per Week: 0 days    Minutes of Exercise per Session: Not on file  Stress: Stress Concern Present (12/27/2023)   [employer] of Occupational Health - Occupational Stress Questionnaire    Feeling of Stress: Very much  Social Connections: Socially Isolated (12/27/2023)   Social Connection and Isolation Panel    Frequency of Communication with Friends and Family: Twice a week    Frequency of Social Gatherings with Friends and Family: Once a week    Attends Religious Services: Patient declined    Database Administrator or Organizations: No    Attends Engineer, Structural: Not on file    Marital Status: Widowed  Depression (PHQ2-9): Low Risk (02/13/2024)   Depression (PHQ2-9)    PHQ-2 Score: 0  Recent Concern: Depression (PHQ2-9) - Medium Risk (12/01/2023)   Depression (PHQ2-9)    PHQ-2 Score: 5  Alcohol Screen: Low Risk (12/01/2023)   Alcohol Screen    Last Alcohol Screening Score (AUDIT): 0  Housing: Low Risk (12/27/2023)   Epic    Unable to Pay for Housing in the Last Year: No    Number of Times Moved in the Last Year: 0    Homeless in the Last Year: No  Utilities: Not At Risk (12/01/2023)   Epic    Threatened with loss of utilities: No  Health Literacy: Adequate Health Literacy (12/01/2023)   B1300 Health Literacy    Frequency of need for  help with medical instructions: Never   Allergies[1] Family History  Problem Relation Age of Onset   Cancer Mother        melanoma   Hyperlipidemia Mother    Breast cancer Mother    Liver cancer Father    Cancer Maternal Grandfather        unknown type   Heart disease Paternal Grandfather    Diabetes Maternal Aunt    Diabetes  Sister    Lung disease Neg Hx    Colon cancer Neg Hx    Esophageal cancer Neg Hx    Rectal cancer Neg Hx    Stomach cancer Neg Hx     Current Outpatient Medications (Endocrine & Metabolic):    JARDIANCE  25 MG TABS tablet, Take 25 mg by mouth every morning.   metFORMIN  (GLUCOPHAGE -XR) 500 MG 24 hr tablet, TAKE 1 TABLET BY MOUTH EVERY DAY WITH BREAKFAST  Current Outpatient Medications (Cardiovascular):    atorvastatin  (LIPITOR) 40 MG tablet, TAKE 1 TABLET BY MOUTH EVERY DAY   metoprolol  succinate (TOPROL -XL) 25 MG 24 hr tablet, Take 1 tablet (25 mg total) by mouth daily. Take with or immediately following a meal   olmesartan  (BENICAR ) 20 MG tablet, Take 0.5 tablets (10 mg total) by mouth daily.  Current Outpatient Medications (Respiratory):    Albuterol -Budesonide (AIRSUPRA ) 90-80 MCG/ACT AERO, Inhale 1-2 puffs into the lungs every 6 (six) hours as needed (Cough). Rinse mouth out after use.  Current Outpatient Medications (Analgesics):    aspirin  EC 81 MG tablet, Take 1 tablet (81 mg total) by mouth daily. Swallow whole.  Current Outpatient Medications (Other):    Cholecalciferol (D 5000) 125 MCG (5000 UT) capsule, Take 5,000 Units by mouth daily.   citalopram  (CELEXA ) 20 MG tablet, Take 1 tablet (20 mg total) by mouth daily.   diphenhydramine -acetaminophen  (TYLENOL  PM EXTRA STRENGTH) 25-500 MG TABS tablet, Take 1 tablet by mouth as directed.   DULoxetine  (CYMBALTA ) 20 MG capsule, Take 1 capsule (20 mg total) by mouth daily.   fluconazole  (DIFLUCAN ) 150 MG tablet, Take 1 tab, repeat in 72 hours if no improvement.   Homeopathic Products (ZICAM ALLERGY   RELIEF NA), Place 1 application into the nose 2 (two) times daily as needed (congestion/allergies).    Melatonin 5 MG CAPS, Take 20 mg by mouth.   Menthol, Topical Analgesic, (BIOFREEZE EX), Apply 1 application topically at bedtime as needed (pain).   omeprazole  (PRILOSEC) 20 MG capsule, Take 1 capsule (20 mg total) by mouth daily. Please call [phone] to schedule an office visit for more refills.   Polyethyl Glycol-Propyl Glycol (SYSTANE) 0.4-0.3 % SOLN, Place 1 drop into both eyes daily as needed (dry eyes).   Simethicone  (GAS-X EXTRA STRENGTH) 62.5 MG STRP, Take by mouth as directed.   zolpidem  (AMBIEN  CR) 6.25 MG CR tablet, TAKE 1 TABLET (6.25 MG TOTAL) BY MOUTH AT BEDTIME AS NEEDED. FOR SLEEP   Reviewed prior external information including notes and imaging from  primary care provider As well as notes that were available from care everywhere and other healthcare systems.  Past medical history, social, surgical and family history all reviewed in electronic medical record.  No pertanent information unless stated regarding to the chief complaint.   Review of Systems:  No headache, visual changes, nausea, vomiting, diarrhea, constipation, dizziness, abdominal pain, skin rash, fevers, chills, night sweats, weight loss, swollen lymph nodes, body aches, joint swelling, chest pain, shortness of breath, mood changes. POSITIVE muscle aches, muscle cramping  Objective  Blood pressure 112/62, pulse 80, height 5' 10 (1.778 m), weight 194 lb (88 kg), SpO2 94%.   General: No apparent distress alert and oriented x3 mood and affect normal, dressed appropriately.  HEENT: Pupils equal, extraocular movements intact  Respiratory: Patient's speak in full sentences and does not appear short of breath  Cardiovascular: No lower extremity edema, non tender, no erythema dorsalis pulses noted and symmetric Back exam shows patient does have some mild loss of  lordosis noted.  Does have some weakness with 3+ out  of 5 strength of the hip abductors.  Severely tender to palpation over the greater trochanteric area.   Procedure: Real-time Ultrasound Guided Injection of right greater trochanteric bursitis secondary to patient's body habitus Device: GE Logiq Q7 Ultrasound guided injection is preferred based studies that show increased duration, increased effect, greater accuracy, decreased procedural pain, increased response rate, and decreased cost with ultrasound guided versus blind injection.  Verbal informed consent obtained.  Time-out conducted.  Noted no overlying erythema, induration, or other signs of local infection.  Skin prepped in a sterile fashion.  Local anesthesia: Topical Ethyl chloride.  With sterile technique and under real time ultrasound guidance:  Greater trochanteric area was visualized and patient's bursa was noted. A 22-gauge 3 inch needle was inserted and 4 cc of 0.5% Marcaine and 1 cc of Kenalog 40 mg/dL was injected. Pictures taken Completed without difficulty  Pain immediately resolved suggesting accurate placement of the medication.  Advised to call if fevers/chills, erythema, induration, drainage, or persistent bleeding.  Images permanently stored   Impression: Technically successful ultrasound guided injection.   Procedure: Real-time Ultrasound Guided Injection of left  greater trochanteric bursitis secondary to patient's body habitus Device: GE Logiq Q7  Ultrasound guided injection is preferred based studies that show increased duration, increased effect, greater accuracy, decreased procedural pain, increased response rate, and decreased cost with ultrasound guided versus blind injection.  Verbal informed consent obtained.  Time-out conducted.  Noted no overlying erythema, induration, or other signs of local infection.  Skin prepped in a sterile fashion.  Local anesthesia: Topical Ethyl chloride.  With sterile technique and under real time ultrasound guidance:  Greater  trochanteric area was visualized and patient's bursa was noted. A 22-gauge 3 inch needle was inserted and 4 cc of 0.5% Marcaine and 1 cc of Kenalog 40 mg/dL was injected. Pictures taken Completed without difficulty  Pain immediately resolved suggesting accurate placement of the medication.  Advised to call if fevers/chills, erythema, induration, drainage, or persistent bleeding.  Images permanently stored  Impression: Technically successful ultrasound guided injection.  [id number]; 15 additional minutes spent for Therapeutic exercises as stated in above notes.  This included exercises focusing on stretching, strengthening, with significant focus on eccentric aspects.   Long term goals include an improvement in range of motion, strength, endurance as well as avoiding reinjury. Patient's frequency would include in 1-2 times a day, 3-5 times a week for a duration of 6-12 weeks.Hip strengthening exercises which included:  Pelvic tilt/bracing to help with proper recruitment of the lower abs and pelvic floor muscles  Glute strengthening to properly contract glutes without over-engaging low back and hamstrings - prone hip extension and glute bridge exercises Proper stretching techniques to increase effectiveness for the hip flexors, groin, quads, piriformic and low back when appropriate    Proper technique shown and discussed handout in great detail with ATC.  All questions were discussed and answered.     Impression and Recommendations:     The above documentation has been reviewed and is accurate and complete Karen CHRISTELLA Sharps, DO       [1]  Allergies Allergen Reactions   Aspartame And Phenylalanine Other (See Comments)    Headache   Codeine Nausea Only

## 2024-02-28 ENCOUNTER — Encounter: Payer: Self-pay | Admitting: Family Medicine

## 2024-02-28 ENCOUNTER — Ambulatory Visit

## 2024-02-28 ENCOUNTER — Ambulatory Visit: Admitting: Family Medicine

## 2024-02-28 ENCOUNTER — Other Ambulatory Visit: Payer: Self-pay

## 2024-02-28 VITALS — BP 112/62 | HR 80 | Ht 70.0 in | Wt 194.0 lb

## 2024-02-28 DIAGNOSIS — M25551 Pain in right hip: Secondary | ICD-10-CM | POA: Diagnosis not present

## 2024-02-28 DIAGNOSIS — M545 Low back pain, unspecified: Secondary | ICD-10-CM

## 2024-02-28 DIAGNOSIS — R5383 Other fatigue: Secondary | ICD-10-CM

## 2024-02-28 DIAGNOSIS — M7061 Trochanteric bursitis, right hip: Secondary | ICD-10-CM | POA: Diagnosis not present

## 2024-02-28 DIAGNOSIS — M25552 Pain in left hip: Secondary | ICD-10-CM | POA: Diagnosis not present

## 2024-02-28 DIAGNOSIS — M7062 Trochanteric bursitis, left hip: Secondary | ICD-10-CM | POA: Diagnosis not present

## 2024-02-28 DIAGNOSIS — E559 Vitamin D deficiency, unspecified: Secondary | ICD-10-CM

## 2024-02-28 HISTORY — DX: Hypercalcemia: E83.52

## 2024-02-28 HISTORY — DX: Trochanteric bursitis, right hip: M70.61

## 2024-02-28 NOTE — Assessment & Plan Note (Signed)
 Recheck levels as well as with patient's elevated LFTs.  Known to have fatty liver disease awaiting ultrasound of the liver.

## 2024-02-28 NOTE — Patient Instructions (Addendum)
 Good to see you. Labs today on the way out. Xray today Exercises for bursitis. B hip injections See me again in 2 months

## 2024-02-28 NOTE — Assessment & Plan Note (Signed)
 Patient given injections and tolerated the procedure well, discussed icing regimen and home exercises, discussed which activities to do and which ones to avoid.  Differential includes lumbar radiculopathy and x-rays ordered today.  Patient also did have some hypercalcemia at last lab draw and I would like to recheck it with a PTH level which could be another potential contribution to the leg pain that patient has described.  Patient is in agreement with the plan.  Given exercises and work with event organiser.  Follow-up again in 6 to 8 weeks otherwise

## 2024-02-29 ENCOUNTER — Ambulatory Visit: Payer: Self-pay | Admitting: Family Medicine

## 2024-02-29 LAB — URIC ACID: Uric Acid, Serum: 5.4 mg/dL (ref 2.4–7.0)

## 2024-02-29 LAB — COMPREHENSIVE METABOLIC PANEL WITH GFR
ALT: 62 U/L — ABNORMAL HIGH (ref 3–35)
AST: 65 U/L — ABNORMAL HIGH (ref 5–37)
Albumin: 4.4 g/dL (ref 3.5–5.2)
Alkaline Phosphatase: 263 U/L — ABNORMAL HIGH (ref 39–117)
BUN: 21 mg/dL (ref 6–23)
CO2: 24 meq/L (ref 19–32)
Calcium: 10.4 mg/dL (ref 8.4–10.5)
Chloride: 103 meq/L (ref 96–112)
Creatinine, Ser: 1.14 mg/dL (ref 0.40–1.20)
GFR: 48.76 mL/min — ABNORMAL LOW (ref >60.00)
Glucose, Bld: 171 mg/dL — ABNORMAL HIGH (ref 70–99)
Potassium: 4 meq/L (ref 3.5–5.1)
Sodium: 136 meq/L (ref 135–145)
Total Bilirubin: 0.4 mg/dL (ref 0.2–1.2)
Total Protein: 8.3 g/dL (ref 6.0–8.3)

## 2024-02-29 LAB — CBC WITH DIFFERENTIAL/PLATELET
Basophils Absolute: 0.1 K/uL (ref 0.0–0.1)
Basophils Relative: 1.1 % (ref 0.0–3.0)
Eosinophils Absolute: 0.4 K/uL (ref 0.0–0.7)
Eosinophils Relative: 4.2 % (ref 0.0–5.0)
HCT: 45 % (ref 36.0–46.0)
Hemoglobin: 14.5 g/dL (ref 12.0–15.0)
Lymphocytes Relative: 31.8 % (ref 12.0–46.0)
Lymphs Abs: 2.8 K/uL (ref 0.7–4.0)
MCHC: 32.2 g/dL (ref 30.0–36.0)
MCV: 85.5 fl (ref 78.0–100.0)
Monocytes Absolute: 0.8 K/uL (ref 0.1–1.0)
Monocytes Relative: 8.6 % (ref 3.0–12.0)
Neutro Abs: 4.8 K/uL (ref 1.4–7.7)
Neutrophils Relative %: 54.3 % (ref 43.0–77.0)
Platelets: 291 K/uL (ref 150.0–400.0)
RBC: 5.27 Mil/uL — ABNORMAL HIGH (ref 3.87–5.11)
RDW: 14.3 % (ref 11.5–15.5)
WBC: 8.9 K/uL (ref 4.0–10.5)

## 2024-02-29 LAB — SEDIMENTATION RATE: Sed Rate: 61 mm/h — ABNORMAL HIGH (ref 0–30)

## 2024-02-29 LAB — LIPASE: Lipase: 30 U/L (ref 11.0–59.0)

## 2024-02-29 LAB — VITAMIN D 25 HYDROXY (VIT D DEFICIENCY, FRACTURES): VITD: 39.63 ng/mL (ref 30.00–100.00)

## 2024-03-03 LAB — PTH, INTACT AND CALCIUM
Calcium: 10.6 mg/dL — ABNORMAL HIGH (ref 8.6–10.4)
PTH: 70 pg/mL (ref 16–77)

## 2024-03-07 ENCOUNTER — Other Ambulatory Visit: Payer: Self-pay | Admitting: Family Medicine

## 2024-03-07 DIAGNOSIS — M79604 Pain in right leg: Secondary | ICD-10-CM

## 2024-03-07 DIAGNOSIS — M25551 Pain in right hip: Secondary | ICD-10-CM

## 2024-03-08 ENCOUNTER — Other Ambulatory Visit: Payer: Self-pay | Admitting: Family Medicine

## 2024-03-09 DIAGNOSIS — G8929 Other chronic pain: Secondary | ICD-10-CM | POA: Insufficient documentation

## 2024-03-09 DIAGNOSIS — T7840XA Allergy, unspecified, initial encounter: Secondary | ICD-10-CM | POA: Insufficient documentation

## 2024-03-09 DIAGNOSIS — K754 Autoimmune hepatitis: Secondary | ICD-10-CM | POA: Insufficient documentation

## 2024-03-09 DIAGNOSIS — H269 Unspecified cataract: Secondary | ICD-10-CM | POA: Insufficient documentation

## 2024-03-09 DIAGNOSIS — M25569 Pain in unspecified knee: Secondary | ICD-10-CM | POA: Insufficient documentation

## 2024-03-13 ENCOUNTER — Ambulatory Visit

## 2024-03-13 VITALS — BP 108/80 | HR 86 | Ht 66.0 in | Wt 191.1 lb

## 2024-03-13 DIAGNOSIS — R7989 Other specified abnormal findings of blood chemistry: Secondary | ICD-10-CM | POA: Insufficient documentation

## 2024-03-13 DIAGNOSIS — E782 Mixed hyperlipidemia: Secondary | ICD-10-CM

## 2024-03-13 DIAGNOSIS — I10 Essential (primary) hypertension: Secondary | ICD-10-CM | POA: Diagnosis not present

## 2024-03-13 DIAGNOSIS — I2510 Atherosclerotic heart disease of native coronary artery without angina pectoris: Secondary | ICD-10-CM | POA: Insufficient documentation

## 2024-03-13 NOTE — Assessment & Plan Note (Signed)
 Well-controlled. Continue metoprolol  XL 25 mg once daily and olmesartan  10 mg once daily.

## 2024-03-13 NOTE — Assessment & Plan Note (Signed)
 Cardiac CT was requested completed 02/08/2024 noted calcium  score 0, CAD RADS 2 study mild nonobstructive disease.  Extracardiac findings noted right lung findings consistent with scarring or atelectasis.  4 mm stable pleural-based nodule no follow-up required if patient is low risk.   Without any significant obstructive disease and no prior history of MI, CVA, okay to discontinue aspirin .  Continue lipid-lowering therapy with statins.

## 2024-03-13 NOTE — Patient Instructions (Signed)
 Medication Instructions:  Your physician recommends that you continue on your current medications as directed. Please refer to the Current Medication list given to you today.  *If you need a refill on your cardiac medications before your next appointment, please call your pharmacy*   Lab Work: None ordered If you have labs (blood work) drawn today and your tests are completely normal, you will receive your results only by: MyChart Message (if you have MyChart) OR A paper copy in the mail If you have any lab test that is abnormal or we need to change your treatment, we will call you to review the results.   Testing/Procedures: None ordered   Follow-Up: At [facility], you and your health needs are our priority.  As part of our continuing mission to provide you with exceptional heart care, we have created designated Provider Care Teams.  These Care Teams include your primary Cardiologist (physician) and Advanced Practice Providers (APPs -  Physician Assistants and Nurse Practitioners) who all work together to provide you with the care you need, when you need it.  We recommend signing up for the patient portal called "MyChart".  Sign up information is provided on this After Visit Summary.  MyChart is used to connect with patients for Virtual Visits (Telemedicine).  Patients are able to view lab/test results, encounter notes, upcoming appointments, etc.  Non-urgent messages can be sent to your provider as well.   To learn more about what you can do with MyChart, go to [email or web address].    Your next appointment:   Follow up as needed  The format for your next appointment:   In Person  Provider:   Huntley Dec, MD    Other Instructions none  Important Information About Sugar

## 2024-03-13 NOTE — Progress Notes (Signed)
 "  Cardiology Consultation:    Date:  03/13/2024   ID:  Karen Hall, Karen Hall Feb 19, 1956, MRN [id number]  PCP:  Frann Mabel Mt, DO  Cardiologist:  Alean SAUNDERS Dannielle Baskins, MD   Referring MD: Frann Mabel Mt, DO   No chief complaint on file.    ASSESSMENT AND PLAN:   Ms. Grieshop 68 year old woman history of hypertension, diabetes, anxiety, GERD, hyperlipidemia   Prior echocardiogram from April 2021 reported mild LVH with grade 1 diastolic dysfunction EF 60 to 34%, normal RV function, trace MR, trace TR. No prior history of CAD, CHF, MI, CVA.  Non-smoker.  Rare social alcohol consumption.  No illicit drug use.  Was evaluated for symptoms of dyspnea on exertion. Cardiac CT was requested completed 02/08/2024 noted calcium  score 0, CAD RADS 2 study mild nonobstructive disease.  Extracardiac findings noted right lung findings consistent with scarring or atelectasis.  4 mm stable pleural-based nodule no follow-up required if patient is low risk.  Echocardiogram from 01/26/2024 noted normal biventricular function with LVEF 60 to 65%, mild asymmetric LVH of basal septum without significant outflow gradient.  Trace MR.  Here for follow-up visit Problem List Items Addressed This Visit       Cardiovascular and Mediastinum   Essential hypertension   Well-controlled. Continue metoprolol  XL 25 mg once daily and olmesartan  10 mg once daily.       Coronary atherosclerosis - Primary   Cardiac CT was requested completed 02/08/2024 noted calcium  score 0, CAD RADS 2 study mild nonobstructive disease.  Extracardiac findings noted right lung findings consistent with scarring or atelectasis.  4 mm stable pleural-based nodule no follow-up required if patient is low risk.   Without any significant obstructive disease and no prior history of MI, CVA, okay to discontinue aspirin .  Continue lipid-lowering therapy with statins.         Other   Hyperlipemia   Hyperlipidemia with total  cholesterol 132, HDL for 36, LDL 57, triglycerides 193 on blood work 02/13/2024. Well-controlled.  Continue atorvastatin  40 mg once daily.      Abnormal LFTs   Abnormal LFTs.  Following up closely with her PCP. She does continue moderate to excess amounts of Tylenol  every night for insomnia along with Advil. Advised her to cut down intake and discussed alternative medications with PCP for insomnia.       Continue to follow-up with PCP and can see us  in the office on an as-needed basis.   History of Present Illness:    Karen Hall is a 68 y.o. female who is being seen today for follow-up visit. PCP is Frann Mabel Mt, DO. Last visit with me in the office was 01/25/2024.  Has history of hypertension, diabetes, anxiety, GERD, hyperlipidemia   Prior echocardiogram from April 2021 reported mild LVH with grade 1 diastolic dysfunction EF 60 to 34%, normal RV function, trace MR, trace TR. No prior history of CAD, CHF, MI, CVA.  Non-smoker.  Rare social alcohol consumption.  No illicit drug use.  Seen for further evaluation of symptoms of dyspnea on exertion over the preceding 2 to 3 weeks notably after a trip to [place] , CTA chest PE protocol was unremarkable. Cardiac CT was requested completed 02/08/2024 noted calcium  score 0, CAD RADS 2 study mild nonobstructive disease.  Extracardiac findings noted right lung findings consistent with scarring or atelectasis.  4 mm stable pleural-based nodule no follow-up required if patient is low risk.  Echocardiogram from 01/26/2024 noted normal biventricular function with LVEF 60  to 65%, mild asymmetric LVH of basal septum without significant outflow gradient.  Trace MR.  Here for the visit today by herself. Mentions overall has been doing well. Able to keep up with her day-to-day activities. Has significant improvement in her functional capacity since her left hip pain is improved post local injection for bursitis treatment.  Denies any  cardiac symptoms at this time. Reports mild lightheadedness on sudden change of position from seated to standing or supine to standing.  No falls or syncopal episodes.  Does report having a hard time falling asleep due to thoughts on her mind. Takes Tylenol  at night to help her sleep and mentions she takes up to 3-4 500 mg pills.  Recent blood work from 02/28/2024 shows elevated transaminase and alkaline phosphatase.  Since receiving her echocardiogram and cardiac CT results she has stopped taking aspirin .   Past Medical History:  Diagnosis Date   Allergic rhinitis 10/18/2007   Qualifier: Diagnosis of   By: Delos, CMA, Cindy         Allergy     Anxiety state 10/18/2007   Qualifier: Diagnosis of   By: Johnny MD, Garnette LABOR        Autoimmune hepatitis (HCC)    Cataract    beginnings of   Chronic bronchitis (HCC) 07/21/2015   Chronic cough    Chronic low back pain    Cough variant asthma 12/05/2023   Depression 06/16/2019   Diabetes mellitus (HCC) 02/24/2016   Dyspnea on exertion 06/16/2019   Essential hypertension 10/18/2007   Qualifier: Diagnosis of   By: Delos, CMA, Cindy         GERD 10/18/2007   Qualifier: Diagnosis of  By: Delos, CMA, Cindy     Greater trochanteric bursitis of both hips 02/28/2024   Bilateral injections given February 28, 2024     Headache 10/18/2007   Qualifier: Diagnosis of   By: Delos LATHER, Cindy      IMO SNOMED Dx Update Oct 2024     Hiatal hernia with GERD 06/27/2019   Hypercalcemia 02/28/2024   Hyperlipemia 03/12/2014   Insomnia 03/12/2014   Irritable bowel syndrome 10/18/2007   Qualifier: Diagnosis of  By: Johnny MD, Garnette LABOR    Nevus 02/24/2016   r upper back     Obesity 06/16/2019   Osteopenia    Patellofemoral pain syndrome of right knee 01/25/2020   Recurrent knee pain    R knee hx meniscal tear 2002 with repair   Type 2 diabetes mellitus with hyperglycemia (HCC)    Upper airway cough syndrome 01/23/2019   Onset 2017   BARIUM SWALLOW  07/25/15   Full column gastroesophageal reflux. Asymmetric elevation right hemidiaphragm.  -  rec 01/23/2019  1st gen H1 blockers per guidelines  And tessilon 200       Past Surgical History:  Procedure Laterality Date   BREAST EXCISIONAL BIOPSY Left    Fibroid removed, 24 years ago    BREAST SURGERY     fibroid   CATARACT EXTRACTION Bilateral 09/2018   CHOLECYSTECTOMY     COLONOSCOPY     Around 2010   ESOPHAGOGASTRODUODENOSCOPY (EGD) WITH PROPOFOL  N/A 06/27/2019   Procedure: ESOPHAGOGASTRODUODENOSCOPY (EGD) WITH PROPOFOL ;  Surgeon: San Sandor GAILS, DO;  Location: [facility];  Service: Gastroenterology;  Laterality: N/A;   FOOT SURGERY Right    LIVER BIOPSY  02/2018   MALONEY DILATION  06/27/2019   Procedure: MALONEY DILATION;  Surgeon: San Sandor GAILS, DO;  Location: [facility];  Service: Gastroenterology;;  MENISCUS REPAIR Right    TONSILLECTOMY     TRANSORAL INCISIONLESS FUNDOPLICATION N/A 06/27/2019   Procedure: TRANSORAL INCISIONLESS FUNDOPLICATION;  Surgeon: San Sandor GAILS, DO;  Location: [facility];  Service: Gastroenterology;  Laterality: N/A;    Current Medications: Active Medications[1]   Allergies:   Aspartame and phenylalanine and Codeine   Social History   Socioeconomic History   Marital status: Widowed    Spouse name: Not on file   Number of children: 0   Years of education: Not on file   Highest education level: 12th grade  Occupational History   Not on file  Tobacco Use   Smoking status: Never   Smokeless tobacco: Never  Vaping Use   Vaping status: Never Used  Substance and Sexual Activity   Alcohol use: Not Currently   Drug use: No   Sexual activity: Not on file  Other Topics Concern   Not on file  Social History Narrative   Work or School: airline pilot - [employer]       Home Situation: lives alone      Spiritual Beliefs: none      Lifestyle: no regular exercise; poor diet      [facility]:   Originally from [place].  She has lived in [place], [place] , [place] , & [place] . She has a dog currently. No bird exposure. No hot tub exposure. No mold exposure. Currently works as a theatre manager.       Social Drivers of Health   Tobacco Use: Low Risk (03/13/2024)   Patient History    Smoking Tobacco Use: Never    Smokeless Tobacco Use: Never    Passive Exposure: Not on file  Financial Resource Strain: Low Risk (12/27/2023)   Overall Financial Resource Strain (CARDIA)    Difficulty of Paying Living Expenses: Not hard at all  Food Insecurity: No Food Insecurity (12/27/2023)   Epic    Worried About Programme Researcher, Broadcasting/film/video in the Last Year: Never true    Ran Out of Food in the Last Year: Never true  Transportation Needs: No Transportation Needs (12/27/2023)   Epic    Lack of Transportation (Medical): No    Lack of Transportation (Non-Medical): No  Physical Activity: Inactive (12/27/2023)   Exercise Vital Sign    Days of Exercise per Week: 0 days    Minutes of Exercise per Session: Not on file  Stress: Stress Concern Present (12/27/2023)   [employer] of Occupational Health - Occupational Stress Questionnaire    Feeling of Stress: Very much  Social Connections: Socially Isolated (12/27/2023)   Social Connection and Isolation Panel    Frequency of Communication with Friends and Family: Twice a week    Frequency of Social Gatherings with Friends and Family: Once a week    Attends Religious Services: Patient declined    Database Administrator or Organizations: No    Attends Engineer, Structural: Not on file    Marital Status: Widowed  Depression (PHQ2-9): Low Risk (02/13/2024)   Depression (PHQ2-9)    PHQ-2 Score: 0  Recent Concern: Depression (PHQ2-9) - Medium Risk (12/01/2023)   Depression (PHQ2-9)    PHQ-2 Score: 5  Alcohol Screen: Low Risk (12/01/2023)   Alcohol Screen    Last Alcohol Screening Score (AUDIT): 0  Housing: Low Risk (12/27/2023)   Epic    Unable to Pay for Housing in  the Last Year: No    Number of Times Moved in the Last Year: 0  Homeless in the Last Year: No  Utilities: Not At Risk (12/01/2023)   Epic    Threatened with loss of utilities: No  Health Literacy: Adequate Health Literacy (12/01/2023)   B1300 Health Literacy    Frequency of need for help with medical instructions: Never     Family History: The patient's family history includes Breast cancer in her mother; Cancer in her maternal grandfather and mother; Diabetes in her maternal aunt and sister; Heart disease in her paternal grandfather; Hyperlipidemia in her mother; Liver cancer in her father. There is no history of Lung disease, Colon cancer, Esophageal cancer, Rectal cancer, or Stomach cancer. ROS:   Please see the history of present illness.    All 14 point review of systems negative except as described per history of present illness.  EKGs/Labs/Other Studies Reviewed:    The following studies were reviewed today:   EKG:       Recent Labs: 01/24/2024: Magnesium 1.8 02/28/2024: ALT 62; BUN 21; Creatinine, Ser 1.14; Hemoglobin 14.5; Platelets 291.0; Potassium 4.0; Sodium 136  Recent Lipid Panel    Component Value Date/Time   CHOL 132 02/13/2024 1132   TRIG 193.0 (H) 02/13/2024 1132   HDL 36.30 (L) 02/13/2024 1132   CHOLHDL 4 02/13/2024 1132   VLDL 38.6 02/13/2024 1132   LDLCALC 57 02/13/2024 1132   LDLDIRECT 77.0 04/09/2022 0932    Physical Exam:    VS:  BP 108/80   Pulse 86   Ht 5' 6 (1.676 m)   Wt 191 lb 2 oz (86.7 kg)   SpO2 97%   BMI 30.85 kg/m     Wt Readings from Last 3 Encounters:  03/13/24 191 lb 2 oz (86.7 kg)  02/28/24 194 lb (88 kg)  02/13/24 194 lb (88 kg)     GENERAL:  Well nourished, well developed in no acute distress CARDIAC: RRR, S1 and S2 present, no murmurs, no rubs, no gallops CHEST:  Clear to auscultation without rales, wheezing or rhonchi  Extremities: No pitting pedal edema.   NEUROLOGIC:  Alert and oriented x 3  Medication  Adjustments/Labs and Tests Ordered: Current medicines are reviewed at length with the patient today.  Concerns regarding medicines are outlined above.  No orders of the defined types were placed in this encounter.  No orders of the defined types were placed in this encounter.   Signed, Zygmund Passero reddy Brindy Higginbotham, MD, MPH, [facility]. 03/13/2024 4:21 PM    [facility]    [1]  Current Meds  Medication Sig   Albuterol -Budesonide (AIRSUPRA ) 90-80 MCG/ACT AERO Inhale 1-2 puffs into the lungs every 6 (six) hours as needed (Cough). Rinse mouth out after use.   atorvastatin  (LIPITOR) 40 MG tablet TAKE 1 TABLET BY MOUTH EVERY DAY   Cholecalciferol (D 5000) 125 MCG (5000 UT) capsule Take 5,000 Units by mouth daily.   citalopram  (CELEXA ) 20 MG tablet Take 1 tablet (20 mg total) by mouth daily.   diphenhydramine -acetaminophen  (TYLENOL  PM EXTRA STRENGTH) 25-500 MG TABS tablet Take 1 tablet by mouth as directed.   DULoxetine  (CYMBALTA ) 20 MG capsule Take 1 capsule (20 mg total) by mouth daily.   fluconazole  (DIFLUCAN ) 150 MG tablet Take 1 tab, repeat in 72 hours if no improvement.   Homeopathic Products (ZICAM ALLERGY  RELIEF NA) Place 1 application into the nose 2 (two) times daily as needed (congestion/allergies).    JARDIANCE  25 MG TABS tablet TAKE 1 TABLET BY MOUTH DAILY BEFORE BREAKFAST.   Melatonin 5 MG CAPS Take  20 mg by mouth.   Menthol, Topical Analgesic, (BIOFREEZE EX) Apply 1 application topically at bedtime as needed (pain).   metFORMIN  (GLUCOPHAGE -XR) 500 MG 24 hr tablet TAKE 1 TABLET BY MOUTH EVERY DAY WITH BREAKFAST   metoprolol  succinate (TOPROL -XL) 25 MG 24 hr tablet Take 1 tablet (25 mg total) by mouth daily. Take with or immediately following a meal   olmesartan  (BENICAR ) 20 MG tablet Take 0.5 tablets (10 mg total) by mouth daily.   omeprazole  (PRILOSEC) 20 MG capsule Take 1 capsule (20 mg total) by mouth daily. Please call [phone] to schedule an office visit  for more refills.   Polyethyl Glycol-Propyl Glycol (SYSTANE) 0.4-0.3 % SOLN Place 1 drop into both eyes daily as needed (dry eyes).   Simethicone  (GAS-X EXTRA STRENGTH) 62.5 MG STRP Take by mouth as directed.   zolpidem  (AMBIEN  CR) 6.25 MG CR tablet TAKE 1 TABLET (6.25 MG TOTAL) BY MOUTH AT BEDTIME AS NEEDED. FOR SLEEP   [DISCONTINUED] aspirin  EC 81 MG tablet Take 1 tablet (81 mg total) by mouth daily. Swallow whole.   "

## 2024-03-13 NOTE — Assessment & Plan Note (Signed)
 Hyperlipidemia with total cholesterol 132, HDL for 36, LDL 57, triglycerides 193 on blood work 02/13/2024. Well-controlled.  Continue atorvastatin  40 mg once daily.

## 2024-03-13 NOTE — Assessment & Plan Note (Signed)
 Abnormal LFTs.  Following up closely with her PCP. She does continue moderate to excess amounts of Tylenol  every night for insomnia along with Advil. Advised her to cut down intake and discussed alternative medications with PCP for insomnia.

## 2024-03-21 ENCOUNTER — Ambulatory Visit (HOSPITAL_BASED_OUTPATIENT_CLINIC_OR_DEPARTMENT_OTHER)

## 2024-03-21 ENCOUNTER — Encounter (HOSPITAL_BASED_OUTPATIENT_CLINIC_OR_DEPARTMENT_OTHER): Payer: Self-pay

## 2024-04-04 ENCOUNTER — Ambulatory Visit: Admitting: Family Medicine

## 2024-04-06 ENCOUNTER — Encounter: Payer: Self-pay | Admitting: Family Medicine

## 2024-04-06 ENCOUNTER — Other Ambulatory Visit: Payer: Self-pay | Admitting: Family Medicine

## 2024-04-06 MED ORDER — FLUCONAZOLE 150 MG PO TABS: ORAL_TABLET | ORAL | 0 refills | Status: AC

## 2024-04-20 ENCOUNTER — Other Ambulatory Visit: Payer: Self-pay | Admitting: Family Medicine

## 2024-05-01 ENCOUNTER — Ambulatory Visit: Admitting: Family Medicine

## 2024-12-05 ENCOUNTER — Ambulatory Visit
